# Patient Record
Sex: Female | Born: 1955
Health system: Southern US, Community
[De-identification: ages and names within clinical notes are randomized; demographics above are authoritative.]

## PROBLEM LIST (undated history)

## (undated) DIAGNOSIS — IMO0001 Reserved for inherently not codable concepts without codable children: Secondary | ICD-10-CM

## (undated) DIAGNOSIS — G8929 Other chronic pain: Secondary | ICD-10-CM

## (undated) DIAGNOSIS — K589 Irritable bowel syndrome without diarrhea: Secondary | ICD-10-CM

## (undated) DIAGNOSIS — N816 Rectocele: Secondary | ICD-10-CM

## (undated) DIAGNOSIS — Z9889 Other specified postprocedural states: Secondary | ICD-10-CM

## (undated) DIAGNOSIS — K219 Gastro-esophageal reflux disease without esophagitis: Secondary | ICD-10-CM

## (undated) DIAGNOSIS — Z87820 Personal history of traumatic brain injury: Secondary | ICD-10-CM

## (undated) DIAGNOSIS — R002 Palpitations: Secondary | ICD-10-CM

## (undated) DIAGNOSIS — R519 Headache, unspecified: Secondary | ICD-10-CM

## (undated) DIAGNOSIS — R51 Headache: Secondary | ICD-10-CM

## (undated) DIAGNOSIS — R1013 Epigastric pain: Secondary | ICD-10-CM

## (undated) DIAGNOSIS — N8189 Other female genital prolapse: Secondary | ICD-10-CM

## (undated) HISTORY — DX: Gastro-esophageal reflux disease without esophagitis: K21.9

## (undated) HISTORY — DX: Other female genital prolapse: N81.89

## (undated) HISTORY — DX: Rectocele: N81.6

## (undated) HISTORY — DX: Personal history of traumatic brain injury: Z87.820

## (undated) HISTORY — DX: Irritable bowel syndrome, unspecified: K58.9

## (undated) HISTORY — DX: Headache, unspecified: R51.9

## (undated) HISTORY — DX: Other chronic pain: G89.29

## (undated) HISTORY — DX: Headache: R51

## (undated) HISTORY — DX: Other specified postprocedural states: Z98.890

## (undated) HISTORY — PX: ESOPHAGOGASTRODUODENOSCOPY ENDOSCOPY: SHX5814

## (undated) HISTORY — PX: TONSILLECTOMY: SUR1361

---

## 1999-03-25 DIAGNOSIS — Z9889 Other specified postprocedural states: Secondary | ICD-10-CM

## 1999-03-25 HISTORY — DX: Other specified postprocedural states: Z98.890

## 2000-09-04 ENCOUNTER — Other Ambulatory Visit: Admission: RE | Admit: 2000-09-04 | Discharge: 2000-09-04 | Payer: Self-pay | Admitting: Obstetrics and Gynecology

## 2000-12-31 ENCOUNTER — Encounter: Payer: Self-pay | Admitting: Internal Medicine

## 2000-12-31 ENCOUNTER — Ambulatory Visit (HOSPITAL_COMMUNITY): Admission: RE | Admit: 2000-12-31 | Discharge: 2000-12-31 | Payer: Self-pay | Admitting: Internal Medicine

## 2001-03-11 ENCOUNTER — Encounter: Payer: Self-pay | Admitting: Internal Medicine

## 2001-03-11 ENCOUNTER — Ambulatory Visit (HOSPITAL_COMMUNITY): Admission: RE | Admit: 2001-03-11 | Discharge: 2001-03-11 | Payer: Self-pay | Admitting: Internal Medicine

## 2004-08-26 ENCOUNTER — Other Ambulatory Visit: Admission: RE | Admit: 2004-08-26 | Discharge: 2004-08-26 | Payer: Self-pay | Admitting: Dermatology

## 2005-03-26 ENCOUNTER — Ambulatory Visit: Payer: Self-pay | Admitting: Internal Medicine

## 2005-10-27 ENCOUNTER — Ambulatory Visit: Payer: Self-pay | Admitting: Internal Medicine

## 2006-10-30 ENCOUNTER — Ambulatory Visit: Payer: Self-pay | Admitting: Gastroenterology

## 2007-01-25 ENCOUNTER — Other Ambulatory Visit: Admission: RE | Admit: 2007-01-25 | Discharge: 2007-01-25 | Payer: Self-pay | Admitting: Obstetrics and Gynecology

## 2007-02-05 ENCOUNTER — Ambulatory Visit (HOSPITAL_COMMUNITY): Admission: RE | Admit: 2007-02-05 | Discharge: 2007-02-05 | Payer: Self-pay | Admitting: Obstetrics & Gynecology

## 2007-04-30 ENCOUNTER — Encounter: Payer: Self-pay | Admitting: Emergency Medicine

## 2007-05-01 ENCOUNTER — Ambulatory Visit: Payer: Self-pay | Admitting: *Deleted

## 2007-05-01 ENCOUNTER — Observation Stay (HOSPITAL_COMMUNITY): Admission: EM | Admit: 2007-05-01 | Discharge: 2007-05-01 | Payer: Self-pay | Admitting: Internal Medicine

## 2007-05-14 ENCOUNTER — Ambulatory Visit: Payer: Self-pay

## 2007-06-16 ENCOUNTER — Ambulatory Visit: Payer: Self-pay | Admitting: Cardiology

## 2008-08-18 ENCOUNTER — Other Ambulatory Visit: Admission: RE | Admit: 2008-08-18 | Discharge: 2008-08-18 | Payer: Self-pay | Admitting: Obstetrics and Gynecology

## 2008-12-25 DIAGNOSIS — K589 Irritable bowel syndrome without diarrhea: Secondary | ICD-10-CM | POA: Insufficient documentation

## 2008-12-25 DIAGNOSIS — K219 Gastro-esophageal reflux disease without esophagitis: Secondary | ICD-10-CM | POA: Insufficient documentation

## 2008-12-25 DIAGNOSIS — N959 Unspecified menopausal and perimenopausal disorder: Secondary | ICD-10-CM | POA: Insufficient documentation

## 2008-12-26 ENCOUNTER — Ambulatory Visit (HOSPITAL_COMMUNITY): Admission: RE | Admit: 2008-12-26 | Discharge: 2008-12-26 | Payer: Self-pay | Admitting: Obstetrics & Gynecology

## 2008-12-26 ENCOUNTER — Ambulatory Visit: Payer: Self-pay | Admitting: Internal Medicine

## 2009-01-09 ENCOUNTER — Telehealth (INDEPENDENT_AMBULATORY_CARE_PROVIDER_SITE_OTHER): Payer: Self-pay

## 2009-01-22 IMAGING — CR DG CHEST 1V PORT
1 series · 1 of 1 positions shown · non-contrast
Comparison: No prior studies for comparison.

CLINICAL DATA: Chest pain.  
 PORTABLE CHEST - 1 VIEW ? 04/30/07 AT 2279 HOURS:

[view not recorded]
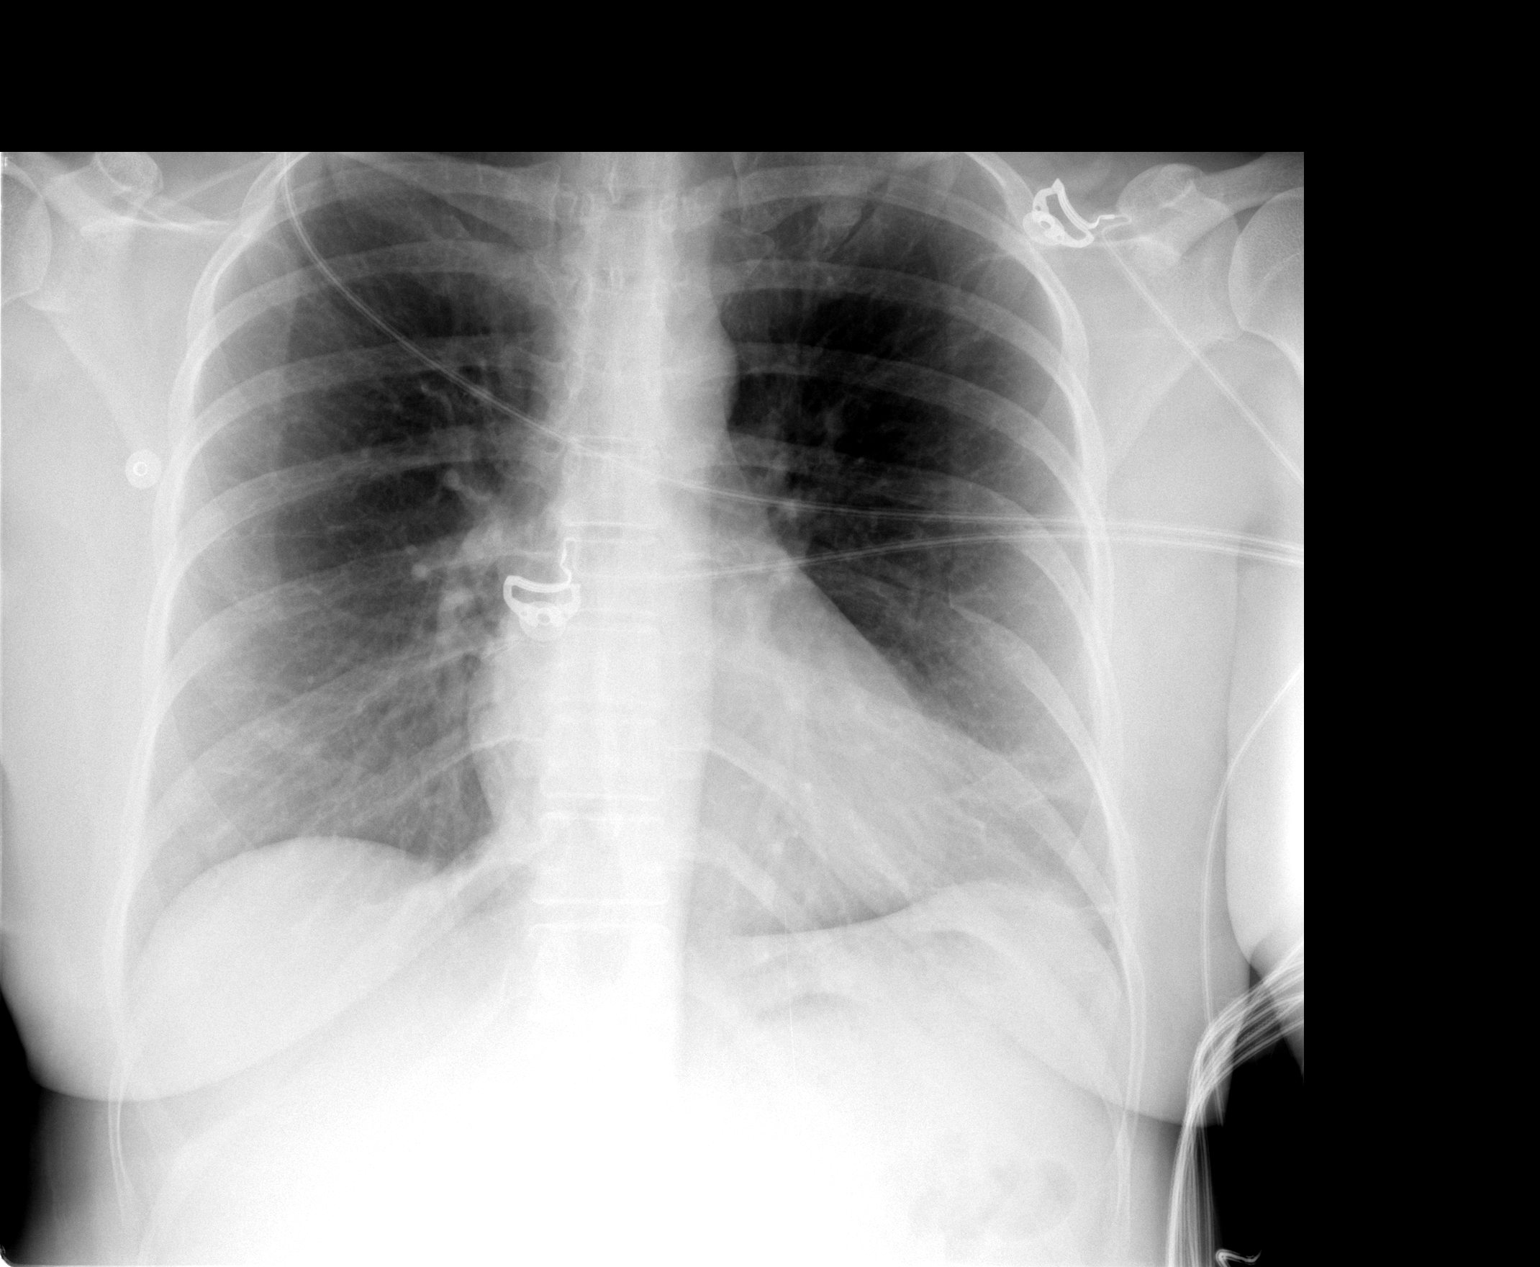

[1 of 1 positions shown; findings below may reference images not displayed]

FINDINGS: The heart size and mediastinal contours are within normal limits.  Both lungs are clear.
IMPRESSION: No acute findings.

## 2010-01-03 ENCOUNTER — Ambulatory Visit (HOSPITAL_COMMUNITY): Admission: RE | Admit: 2010-01-03 | Discharge: 2010-01-03 | Payer: Self-pay | Admitting: Obstetrics and Gynecology

## 2010-01-03 ENCOUNTER — Other Ambulatory Visit: Admission: RE | Admit: 2010-01-03 | Discharge: 2010-01-03 | Payer: Self-pay | Admitting: Obstetrics and Gynecology

## 2010-01-09 ENCOUNTER — Encounter (INDEPENDENT_AMBULATORY_CARE_PROVIDER_SITE_OTHER): Payer: Self-pay | Admitting: *Deleted

## 2010-04-25 NOTE — Assessment & Plan Note (Signed)
Summary: OV 2 YRS,GERD/AMS   Visit Type:  Follow-up Visit Primary Care Provider:  golding  Chief Complaint:  2 year follow up- doing good.  History of Present Illness: Patient is here for two-year followup in regards to gastroesophageal reflux disease. She's done well on Prilosec 20 mg orally daily. She switched from AcipHex because of insurance constraints. She is having no dysphagia no melena rectal bleeding. She was evaluated by Dr. Myrtis Ser 2 years ago for atypical chest pain which is felt to be related to reflux. She had a normal colonoscopy and EGD back in 2001. Colonoscopy was done for Hemoccult positive stool( felt to be related to Ibuprofen). I note she has gained 18 pounds since she was last seen here 2 years ago. She denies any significant intercurrent illness.  Current Problems (verified): 1)  Perimenopausal Syndrome  (ICD-627.9) 2)  Irritable Bowel Syndrome  (ICD-564.1) 3)  Gerd  (ICD-530.81)  Current Medications (verified): 1)  Prilosec Otc 20 Mg Tbec (Omeprazole Magnesium) .... Once Daily 2)  Daily Multiple Vitamins  Tabs (Multiple Vitamin) .... Once Daily 3)  Calcium 500 Mg Tabs (Calcium Carbonate) .... Once Daily 4)  Vitamin D 3000units .... Once Daily 5)  Vitamin C .... Once Daily 6)  Vitamin E .... Once Daily  Allergies (verified): 1)  ! Penicillin  Past History:  Past Surgical History: Last updated: 12/25/2008 tonsillectomy  Family History: Father: alive-MI,dm Mother: alive- MI Siblings: no sisters, 1 brother Family History of Colon Cancer:grandfather  Social History: Marital Status: Married Children: 2 Occupation: [employer] Patient has never smoked.  Alcohol Use - no  Vital Signs:  Patient profile:   55 year old female Height:      63 inches Weight:      169 pounds BMI:     30.05 Temp:     97.8 degrees F oral Pulse rate:   60 / minute BP sitting:   122 / 80  (left arm) Cuff size:   regular  Vitals Entered By: Hendricks Limes  LPN (December 26, 2008 9:11 AM)  Physical Exam  General:  pleasant well-groomed conversant lady in no acute distress. Eyes:  no scleral icterus conjunctivae are pink Lungs:  clear to auscultation Heart:  regular rate and rhythm without murmur gallop rub Abdomen:  somewhat obese positive bowel sounds soft nontender without appreciable mass or organomegaly  Impression & Recommendations: Gastroesophageal  reflux disease controlled on Prilosec 20 mg orally daily. She's gained a significant amount of weight over the past 2 years. I pointed this out to the patient.  Lifestyle diet and exercise changes recommended. Refill on Prilosec 20 mg orally daily written for this nice lady.  Colorectal cancer screening. Last colonoscopy was 2001. We will bring her back in October of 2011 for a screening colonoscopy.  Appended Document: Orders Update-charge    Clinical Lists Changes  Orders: Added new Service order of Est. Patient Level III (16109) - Signed

## 2010-04-25 NOTE — Progress Notes (Signed)
Summary: prilosec rx  Phone Note Call from Patient Call back at Home Phone [phone]   Caller: Patient Summary of Call: pt came to office- she needs rx sent to [facility] for Prilosec otc. RMR wrote for Prilosec. Pts insurance wont pay for it.  per pts insurance she can get a 42 day supply for $5.00. Initial call taken by: Hendricks Limes LPN,  January 09, 2009 3:01 PM     Appended Document: prilosec rx    Prescriptions: PRILOSEC OTC 20 MG TBEC (OMEPRAZOLE MAGNESIUM) once daily  #42 x 11   Entered and Authorized by:   Leanna Battles. Dixon Boos   Signed by:   Leanna Battles Dixon Boos on 01/10/2009   Method used:   Electronically to        [facility] (retail)       [address]       [place], [place]  [address]       Ph: [phone]       Fax: [phone]   RxID:   [id number]

## 2010-04-25 NOTE — Letter (Signed)
Summary: Recall, Screening Colonoscopy Only  [facility]  [address]   [place], [place] [address]   Phone: [phone]  Fax: [phone]    January 09, 2010  Ruth Warner [name] [place], [place]  [address] 12-23-1955   Dear Ms. Biber,   Our records indicate it is time to schedule your colonoscopy.    Please call our office at [phone] and ask for the nurse.   Thank you,    Hendricks Limes, LPN Cloria Spring, LPN  [facility] Ph: [phone]   Fax: [phone]

## 2010-05-05 ENCOUNTER — Emergency Department (HOSPITAL_COMMUNITY)
Admission: EM | Admit: 2010-05-05 | Discharge: 2010-05-05 | Disposition: A | Discharge status: Home / Self Care | Payer: BC Managed Care – PPO | Attending: Emergency Medicine | Admitting: Emergency Medicine

## 2010-05-05 DIAGNOSIS — Y9229 Other specified public building as the place of occurrence of the external cause: Secondary | ICD-10-CM | POA: Insufficient documentation

## 2010-05-05 DIAGNOSIS — W2209XA Striking against other stationary object, initial encounter: Secondary | ICD-10-CM | POA: Insufficient documentation

## 2010-05-05 DIAGNOSIS — S0100XA Unspecified open wound of scalp, initial encounter: Secondary | ICD-10-CM | POA: Insufficient documentation

## 2010-05-10 ENCOUNTER — Emergency Department (HOSPITAL_COMMUNITY): Payer: BC Managed Care – PPO

## 2010-05-10 ENCOUNTER — Emergency Department (HOSPITAL_COMMUNITY)
Admission: EM | Admit: 2010-05-10 | Discharge: 2010-05-10 | Disposition: A | Discharge status: Home / Self Care | Payer: BC Managed Care – PPO | Attending: Emergency Medicine | Admitting: Emergency Medicine

## 2010-05-10 DIAGNOSIS — R42 Dizziness and giddiness: Secondary | ICD-10-CM | POA: Insufficient documentation

## 2010-05-10 DIAGNOSIS — F0781 Postconcussional syndrome: Secondary | ICD-10-CM | POA: Insufficient documentation

## 2010-07-11 ENCOUNTER — Telehealth: Payer: Self-pay

## 2010-07-11 NOTE — Telephone Encounter (Signed)
Gastroenterology Pre-Procedure Form  Request Date: 07/11/2010      PATIENT INFORMATION:  Ruth Warner is a 55 y.o., female (DOB=1956/03/22).  PROCEDURE: Procedure(s) requested: colonoscopy Procedure Reason: family history of colon cancer  PATIENT REVIEW QUESTIONS: The patient reports the following:   1. Diabetes Melitis: no 2. Joint replacements in the past 12 months: no 3. Major health problems in the past 3 months: no 4. Has an artificial valve or MVP:no 5. Has been advised in past to take antibiotics in advance of a procedure like teeth cleaning: no}    MEDICATIONS & ALLERGIES:    Patient reports the following regarding taking any blood thinners:   Plavix? no Aspirin?no Coumadin?  no  Patient confirms/reports the following medications:  Current Outpatient Prescriptions  Medication Sig Dispense Refill  . Cholecalciferol (VITAMIN D) 2000 UNITS tablet Take 2,000 Units by mouth daily.        . Omega-3 Fatty Acids (FISH OIL) 1000 MG CAPS Take by mouth 3 (three) times daily.        Marland Kitchen omeprazole (PRILOSEC) 20 MG capsule Take 20 mg by mouth daily.        . vitamin B-12 (CYANOCOBALAMIN) 1000 MCG tablet Take 1,000 mcg by mouth daily.          Patient confirms/reports the following allergies:  Allergies  Allergen Reactions  . Penicillins     REACTION: rash    Patient is appropriate to schedule for requested procedure(s): yes  AUTHORIZATION INFORMATION Primary Insurance: State BCBS    ID #: [id number] Pre-Cert / Berkley Harvey required:  Pre-Cert / Auth #:  Secondary Insurance: Anthem    ID #: [id number] Pre-Cert / Auth required: Pre-Cert / Auth #  No orders of the defined types were placed in this encounter.    SCHEDULE INFORMATION: Procedure has been scheduled as follows:  Date: 07/30/2010 Time: 9:15 AM  Location: [facility] Short Stay  This Gastroenterology Pre-Precedure Form is being routed to the following provider(s) for review: Lorenza Burton, NP

## 2010-07-15 ENCOUNTER — Other Ambulatory Visit: Payer: Self-pay

## 2010-07-15 DIAGNOSIS — Z139 Encounter for screening, unspecified: Secondary | ICD-10-CM

## 2010-07-15 NOTE — Telephone Encounter (Signed)
OK for colonoscopy.  

## 2010-07-17 ENCOUNTER — Encounter: Payer: Self-pay | Admitting: Gastroenterology

## 2010-07-22 ENCOUNTER — Encounter: Payer: Self-pay | Admitting: Gastroenterology

## 2010-07-22 ENCOUNTER — Ambulatory Visit (INDEPENDENT_AMBULATORY_CARE_PROVIDER_SITE_OTHER): Payer: BC Managed Care – PPO | Admitting: Gastroenterology

## 2010-07-22 VITALS — BP 132/78 | HR 69 | Temp 98.6°F | Ht 63.0 in | Wt 171.4 lb

## 2010-07-22 DIAGNOSIS — Z1211 Encounter for screening for malignant neoplasm of colon: Secondary | ICD-10-CM

## 2010-07-22 DIAGNOSIS — K589 Irritable bowel syndrome without diarrhea: Secondary | ICD-10-CM

## 2010-07-22 DIAGNOSIS — K219 Gastro-esophageal reflux disease without esophagitis: Secondary | ICD-10-CM

## 2010-07-22 MED ORDER — OMEPRAZOLE MAGNESIUM 20 MG PO TBEC: 20.0000 mg | DELAYED_RELEASE_TABLET | Freq: Every day | ORAL | Status: DC

## 2010-07-22 NOTE — Progress Notes (Signed)
Presents with gas, baseline for her for IBS. Intermittent. Relieved by BMs. Bloating. No melena or brbpr. No N/V. No probiotic.  No dysphagia. No lack of appetite.   Primary Care Physician:  Cassell Smiles., MD Primary Gastroenterologist:  Dr. Jena Gauss   Chief Complaint  Patient presents with  . Medication Refill    HPI:  Ruth Warner is a 55 y.o. female here as a follow-up prior to screening colonoscopy, last performed in 2001, normal. Pt has hx of IBS and GERD. She denies melena or brbpr. Denies nausea or vomiting. No lack of appetite. Only complaint is of gas/bloating. She reports she is at baseline with her IBS, with abdominal cramping prior to BMs, then relieved. This is not a new finding. She is not currently taking a probiotic. She is really doing quite well overall. Has FH of colon ca (paternal grandfather).   Past Medical History  Diagnosis Date  . GERD (gastroesophageal reflux disease)   . IBS (irritable bowel syndrome)   . Concussion     staples, hit a shelf  . S/P colonoscopy 2001    Dr. Jena Gauss: normal. Repeat 10 years  . S/P endoscopy 2001    Dr. Jena Gauss: normal    Past Surgical History  Procedure Date  . Tonsillectomy     Current Outpatient Prescriptions  Medication Sig Dispense Refill  . Ascorbic Acid (VITAMIN C) 1000 MG tablet Take 1,000 mg by mouth daily.        . Cholecalciferol (VITAMIN D) 2000 UNITS tablet Take 2,000 Units by mouth daily.        . Multiple Vitamin (MULTIVITAMIN) capsule Take 1 capsule by mouth daily.        . Omega-3 Fatty Acids (FISH OIL) 1000 MG CAPS Take by mouth 3 (three) times daily.        Marland Kitchen omeprazole (PRILOSEC) 20 MG capsule Take 20 mg by mouth daily.        . vitamin B-12 (CYANOCOBALAMIN) 1000 MCG tablet Take 1,000 mcg by mouth daily.        . calcium carbonate (TUMS - DOSED IN MG ELEMENTAL CALCIUM) 500 MG chewable tablet Chew 1 tablet by mouth daily.        . vitamin E 1000 UNIT capsule Take 1,000 Units by mouth daily.           Allergies as of 07/22/2010 - Review Complete 07/22/2010  Allergen Reaction Noted  . Penicillins      Family History  Problem Relation Age of Onset  . Colon cancer Paternal Grandfather     History   Social History  . Marital Status: Married    Spouse Name: N/A    Number of Children: N/A  . Years of Education: N/A   Occupational History  . Full-Time     [employer]   Social History Main Topics  . Smoking status: Never Smoker   . Smokeless tobacco: Never Used  . Alcohol Use: No  . Drug Use: No    Review of Systems: Gen: Denies any fever, chills, sweats, anorexia, fatigue, weakness, malaise, weight loss, and sleep disorder CV: Denies chest pain, angina, palpitations, syncope, orthopnea, PND, peripheral edema, and claudication. Resp: Denies dyspnea at rest, dyspnea with exercise, cough, sputum, wheezing, coughing up blood, and pleurisy. GI: Denies vomiting blood, jaundice, and fecal incontinence.   Denies dysphagia or odynophagia. GU : Denies urinary burning, blood in urine, urinary frequency, urinary hesitancy, nocturnal urination, and urinary incontinence. MS: Denies joint pain, limitation  of movement, and swelling, stiffness, low back pain, extremity pain. Denies muscle weakness, cramps, atrophy.  Derm: Denies rash, itching, dry skin, hives, moles, warts, or unhealing ulcers.  Psych: Denies depression, anxiety, memory loss, suicidal ideation, hallucinations, paranoia, and confusion. Heme: Denies bruising, bleeding, and enlarged lymph nodes.  Physical Exam: BP 132/78  Pulse 69  Temp(Src) 98.6 F (37 C) (Tympanic)  Ht 5\' 3"  (1.6 m)  Wt 171 lb 6.4 oz (77.747 kg)  BMI 30.36 kg/m2 General:   Alert,  Well-developed, well-nourished, pleasant and cooperative in NAD Head:  Normocephalic and atraumatic. Eyes:  Sclera clear, no icterus.   Conjunctiva pink. Ears:  Normal auditory acuity. Nose:  No deformity, discharge,  or lesions. Mouth:  No  deformity or lesions, dentition normal. Neck:  Supple; no masses or thyromegaly. Lungs:  Clear throughout to auscultation.   No wheezes, crackles, or rhonchi. No acute distress. Heart:  Regular rate and rhythm; no murmurs, clicks, rubs,  or gallops. Abdomen:  Soft, nontender and nondistended. No masses, hepatosplenomegaly or hernias noted. Normal bowel sounds, without guarding, and without rebound.   Rectal:  Deferred until time of colonoscopy.   Msk:  Symmetrical without gross deformities. Normal posture. Extremities:  Without clubbing or edema. Neurologic:  Alert and  oriented x4;  grossly normal neurologically. Skin:  Intact without significant lesions or rashes. Cervical Nodes:  No significant cervical adenopathy. Psych:  Alert and cooperative. Normal mood and affect.

## 2010-07-22 NOTE — Patient Instructions (Signed)
We have set you up for a routine screening colonoscopy.  Begin taking a probiotic daily (this is over-the-counter). See samples.  Continue Prilosec 20 mg daily.  F/U in 1 year or sooner if needed

## 2010-07-23 ENCOUNTER — Encounter: Payer: Self-pay | Admitting: Gastroenterology

## 2010-07-23 DIAGNOSIS — Z1211 Encounter for screening for malignant neoplasm of colon: Secondary | ICD-10-CM | POA: Insufficient documentation

## 2010-07-23 NOTE — Assessment & Plan Note (Signed)
55 year old Caucasian female with FH of colon cancer (paternal grandfather), with last colonoscopy normal in 2001. Due for updated screening colonoscopy. No melena or brbpr, no abdominal pain, change in bowel habits, N/V, wt loss, or lack of appetite. Doing well.  Proceed with TCS with Dr. Jena Gauss in near future: the risks, benefits, and alternatives have been discussed with the patient in detail. The patient states understanding and desires to proceed.

## 2010-07-23 NOTE — Progress Notes (Signed)
Cc to PCP 

## 2010-07-23 NOTE — Assessment & Plan Note (Signed)
At baseline; only complaint is of gas/bloating. Will add probiotic.  Probiotic daily, samples provided

## 2010-07-23 NOTE — Assessment & Plan Note (Signed)
Controlled well with Prilosec OTC. No dysphagia or odynophagia. No N/V. Requesting rx for Prilosec OTC.   Prilosec OTC 20 mg daily, # 42 (per pt request and insurance) with 5 refills. Return in 1 year for f/u

## 2010-07-30 ENCOUNTER — Other Ambulatory Visit: Payer: Self-pay | Admitting: Internal Medicine

## 2010-07-30 ENCOUNTER — Encounter: Payer: BC Managed Care – PPO | Admitting: Internal Medicine

## 2010-07-30 ENCOUNTER — Ambulatory Visit (HOSPITAL_COMMUNITY)
Admission: RE | Admit: 2010-07-30 | Discharge: 2010-07-30 | Disposition: A | Discharge status: Home / Self Care | Payer: BC Managed Care – PPO | Source: Ambulatory Visit | Attending: Internal Medicine | Admitting: Internal Medicine

## 2010-07-30 DIAGNOSIS — Z1211 Encounter for screening for malignant neoplasm of colon: Secondary | ICD-10-CM | POA: Insufficient documentation

## 2010-07-30 DIAGNOSIS — D126 Benign neoplasm of colon, unspecified: Secondary | ICD-10-CM

## 2010-07-30 HISTORY — PX: COLONOSCOPY: SHX174

## 2010-08-01 ENCOUNTER — Encounter: Payer: Self-pay | Admitting: Internal Medicine

## 2010-08-02 NOTE — Op Note (Signed)
  NAME:  Ruth Warner, Ruth Warner              ACCOUNT NO.:  [id number]  MEDICAL RECORD NO.:  [id number]           PATIENT TYPE:  O  LOCATION:  DAYP                          FACILITY:  [facility]  PHYSICIAN:  R. Roetta Sessions, M.D. DATE OF BIRTH:  05/19/1955  DATE OF PROCEDURE:  07/30/2010 DATE OF DISCHARGE:                              OPERATIVE REPORT   INDICATIONS FOR PROCEDURE:  A 55 year old lady with irritable bowel syndrome, presents here for screening colonoscopy.  No family history of any colon cancer or polyps in a first-degree relatives.  Negative colonoscopy10 years ago.  Colonoscopy is now being done as standard screening maneuver.  Risks, benefits, limitations, alternatives, and imponderables have been discussed, questions answered.  Please see the documentation in the medical record.  PROCEDURE NOTE:  O2 saturation, blood pressure, pulse, and respirations were monitored throughout the entirety of the procedure.  CONSCIOUS SEDATION:  Versed 4 mg IV, Demerol 75 mg IV in divided doses.  INSTRUMENT:  Pentax video chip system.  FINDINGS:  Digital rectal exam revealed no abnormalities.  Endoscopic findings:  Prep was adequate.  Colon:  Colonic mucosa surveyed from the rectosigmoid junction through the left transverse, right colon, to the appendiceal orifice, ileocecal valve/cecum.  These structures were well seen and photographed for the record.  From this level, the scope was slowly withdrawn.  All previously mentioned mucosal surfaces were again seen.  The patient has single diminutive polyp in the mid descending colon which was cold biopsied/removed.  Remainder of colonic mucosa appeared normal.  The scope was pulled down in to the rectum where thorough examination of the rectal mucosa including retroflex view of the anal verge demonstrated no abnormalities.  The patient tolerated the procedure well.  Cecal withdrawal time 7 minutes.  IMPRESSION: 1. Normal rectum. 2. Single  diminutive polyp in the mid descending colon status post     cold biopsy removal.  Remaining colonic mucosa appeared normal.  RECOMMENDATIONS: 1. Follow up on path. 2. Further recommendations to follow.     Jonathon Bellows, M.D.     RMR/MEDQ  D:  07/30/2010  T:  07/30/2010  Job:  [id number]  cc:   Madelin Rear. Sherwood Gambler, MD Fax: [phone]  Electronically Signed by Lorrin Goodell M.D. on 08/02/2010 08:20:09 AM

## 2010-08-06 NOTE — H&P (Signed)
NAMEGENESYS, COGGESHALL              ACCOUNT NO.:  [id number]   MEDICAL RECORD NO.:  [id number]          PATIENT TYPE:  INP   LOCATION:  2033                         FACILITY:  [facility]   PHYSICIAN:  Unice Cobble, MD     DATE OF BIRTH:  1955-10-20   DATE OF ADMISSION:  05/01/2007  DATE OF DISCHARGE:                              HISTORY & PHYSICAL   CHIEF COMPLAINT:  Chest pain.   HISTORY OF PRESENT ILLNESS:  This is a 55 year old white female with no  past medical history who presents with chest pain.  Her chest pain  occurred last night for approximately 1 hour and dissolved on its own.  Tonight, it reoccurred while she was sitting.  She describes the pain as  substernal chest tightness which radiates to her throat with bilateral  hand tingling and heavy heart beats.  She denies shortness of breath,  diaphoresis, nausea, vomiting, presyncope.  The pain lasts approximately  1-1/2 hours until sublingual nitroglycerin spray was given in the  emergency department which dissolved the pain.  No symptoms of CHF.  She  is currently going through menopause.   PAST MEDICAL HISTORY:  Status post tonsillectomy.   ALLERGIES:  PENICILLIN.   MEDICATIONS:  1. Multivitamin.  2. Fish oil.   SOCIAL HISTORY:  She lives in [place] with her husband.  She is a  bookkeeper.  No tobacco, alcohol or drugs.   FAMILY HISTORY:  Her mother had a heart attack at the age of 49.  Father  had a heart attack in his 15s.   REVIEW OF SYSTEMS:  Her last menstrual period was October 02, 2006.  All  other review of systems were reviewed and found to be negative except as  stated in the HPI.   PHYSICAL EXAMINATION:  VITAL SIGNS:  Temperature afebrile, pulse 97,  respiratory rate 18, blood pressure 128/63, O2 sats are 100% on room  air.  GENERAL:  She is in no acute distress.  Thin.  HEENT:  Normocephalic, atraumatic.  PERRLA.  EOMI.  MMM.  Normal  dentition.  Oropharynx without erythema or exudates.  NECK:   Supple without lymphadenopathy, thyromegaly, bruits or jugular  venous distention.  HEART:  Regular rate and rhythm with normal S1 and S2.  No murmurs, rubs  or gallops.  Normal PMI.  Pulses are 2+ and equal bilaterally without  bruits.  LUNGS:  Clear to auscultation bilaterally.  SKIN:  No rashes or lesions.  ABDOMEN:  Soft, nontender with normal bowel sounds.  No rebound or  guarding.  Negative hepatosplenomegaly.  EXTREMITIES:  No clubbing, cyanosis, or edema.  MUSCULOSKELETAL:  No joint deformity, effusions of spine or  costovertebral angle tenderness.  NEUROLOGIC:  She is alert and oriented x 3.  Cranial nerves II-XII are  grossly intact.  Strength 5/5 to all extremities in all muscle groups.  Normal sensation throughout.  Normal cerebellar function.   DIAGNOSTICS:  EKG shows a rate of 100 with normal sinus rhythm.  She has  mild ST/T wave changes which are nonspecific.   LABORATORY DATA:  White count 6.5 with a hemoglobin  of 13.8, platelet  count 191,000, BNP is less than 30 with negative CK-MB and troponin.  Creatinine 0.8.   ASSESSMENT/PLAN:  This is a 55 year old white female with no past  medical history and a positive family history of early coronary artery  disease, who presents with chest pain concerning for unstable angina.  I  will continue her treatment with Lovenox, aspirin and nitroglycerin  p.r.n.  Oxygen will be administered and she will be given metoprolol if  hypertensive.  Lipids will be checked and a statin will be started.  Stress versus catheterization will be decided depending on enzyme values  of her cardiac rule out.      Unice Cobble, MD  Electronically Signed     ACJ/MEDQ  D:  05/01/2007  T:  05/01/2007  Job:  [id number]

## 2010-08-06 NOTE — Assessment & Plan Note (Signed)
NAME:  Ruth Warner, Ruth Warner               CHART#:  [id number]   DATE:  10/30/2006                       DOB:  10/20/1955   CHIEF COMPLAINT:  Followup of acid reflux disease and irritable bowel  syndrome.   SUBJECTIVE:  The patient is here for a followup.  She was last seen on  October 27, 2005.  She has done very well since we last saw her.  She has  gastroesophageal reflux disease, controlled on AcipHex 20 mg daily.  If  she misses a dose, she begins having breakthrough symptoms.  Her  irritable bowel syndrome symptoms are very infrequent.  The last  colonoscopy was in 2001 and she is not due for a repeat screen until  2011.  She denies any melena or rectal bleeding.  No odynophagia or  dysphagia.   CURRENT MEDICATIONS:  AcipHex 20 mg daily.   ALLERGIES:  Penicillin.   PHYSICAL EXAM:  Weight 151, up 2.5 pounds.  Temperature 98.4, blood  pressure 108/64, pulse 64.  GENERAL:  A pleasant well-developed, well-nourished Caucasian female in  no acute distress.  SKIN:  Warm and dry.  No jaundice.  ABDOMEN:  Positive bowel sounds.  Soft, nontender, nondistended.  No  organomegaly or masses.   IMPRESSION:  1. Gastroesophageal reflux disease well-controlled on AcipHex.  2. Irritable bowel syndrome symptoms, quiescent.   RECOMMENDATIONS:  1. Continue antireflux measures.  2. Continue AcipHex 20 mg daily.  3. Will see her back in 2 years.       Tana Coast, P.A.  Electronically Signed     R. Roetta Sessions, M.D.  Electronically Signed    LL/MEDQ  D:  10/30/2006  T:  10/30/2006  Job:  [id number]   cc:   Kirk Ruths, M.D.

## 2010-08-06 NOTE — Discharge Summary (Signed)
Ruth Warner, Ruth Warner              ACCOUNT NO.:  [id number]   MEDICAL RECORD NO.:  [id number]          PATIENT TYPE:  INP   LOCATION:  2033                         FACILITY:  [facility]   PHYSICIAN:  Luis Abed, MD, FACCDATE OF BIRTH:  [birth year]   DATE OF ADMISSION:  05/01/2007  DATE OF DISCHARGE:  05/01/2007                         DISCHARGE SUMMARY - REFERRING   PRIMARY CARE PHYSICIAN:  Kirk Ruths, M.D.   GASTROENTEROLOGIST:  Jonathon Bellows, M.D.   CARDIOLOGIST:  Luis Abed, M.D., [facility]   BRIEF HISTORY:  Ms. Choy is a 55 year old female who presented with 1  hour of chest discomfort that occurred with rest.  She described it as  tightness radiating to the throat and bilateral hand tingling.  She  noticed heavy heartbeat but specifically denied any associated shortness  of breath, diaphoresis, nausea, or vomiting.  She stated that it lasted  for about 1-1/2 hours until she received sublingual nitroglycerin spray  in the emergency room which resolved the discomfort.   PAST MEDICAL HISTORY:  1. Tonsillectomy.  2. Prior GI evaluation for GERD.  3. Perimenopausal.   LABORATORY DATA:  Admission hemoglobin 13.8, hematocrit 38.9, normal  indices, platelets 191, WBCs 6.5.  PTT 38, PT 14.5.  Sodium 138,  potassium 3.5, BUN 9, creatinine 0.79, glucose 145.  CK-MBs, relative  indexes, and troponins were within normal limits x2.  BNP less than 30.  Fasting lipids showed a total cholesterol 143, triglycerides 29, HDL 44,  LDL 93.   After being seen by Dr. Laural Benes, she was admitted to the hospital and  placed on Lovenox as well as aspirin and beta blocker.  Over the next 12  hours, she had two sets of enzymes and two point-of-care markers that  were unremarkable.  After review, Dr. Myrtis Ser felt that the patient could  be discharged home with outpatient followup.   DISCHARGE DIAGNOSIS:  Chest discomfort of uncertain etiology.  History  as noted above.   DISPOSITION:  The  patient is discharged home.   DIET:  She is asked to maintain a low-sodium, heart-healthy diet.   ACTIVITY:  Activity is not restricted.   WOUND CARE:  Not applicable.   DISCHARGE MEDICATIONS:  She received a new prescription for Protonix 40  mg daily and asked to begin aspirin 81 mg daily.  She was given  permission to continue multivitamins and fish oil as previously.   FOLLOW UP:  The office will call her with arrangements for a stress  Myoview next week and a followup appointment with Dr. Myrtis Ser.  She was  asked to bring all medications to all appointments.   TIME SPENT AT DISCHARGE:  25 minutes.      Joellyn Rued, PA-C      Luis Abed, MD, [facility]  Electronically Signed    EW/MEDQ  D:  05/01/2007  T:  05/02/2007  Job:  [id number]   cc:   Kirk Ruths, M.D.  Jonathon Bellows, M.D.  Luis Abed, MD, [facility]

## 2010-08-06 NOTE — Assessment & Plan Note (Signed)
[facility]                            CARDIOLOGY OFFICE NOTE   NAME:Nudelman, SUNDAY KLOS                     MRN:          [id number]  DATE:06/16/2007                            DOB:          1955/12/20    Ms. Montone had been seen at [facility] on May 01, 2007.  She  had a pressure sensation in her chest.  She stabilized and we decided  that it would be safe for her to go home and have an outpatient Myoview  scan. The study was done on May 14, 2007 and she did quite well.  She exercised 7-1/2 minutes.  There was no EKG change.  The nuclear  images were normal. She is feeling well. She is having symptoms that may  be related to menopause.  At times she has a sensation of heat and  various other feelings.  She will need to follow with her other doctors  to get their opinion concerning this.  I doubt that her current symptoms  are cardiac.   PAST MEDICAL HISTORY:   ALLERGIES:  PENICILLIN.   MEDICATIONS:  Protonix, aspirin, fish oil, multivitamin.   OTHER MEDICAL PROBLEMS:  See the list below.   REVIEW OF SYSTEMS:  Other than the HPI she feels fine.   PHYSICAL EXAM:  Blood pressure is 118/79 with a pulse of 80.  The patient is oriented to person, time and place.  Affect is normal.  HEENT:  Reveals no xanthelasma.  She has normal extraocular motion.  LUNGS:  Clear.  CARDIAC:  Reveals an S1 with an S2.  There are no clicks or significant  murmurs.  She has no peripheral edema.   PROBLEM LIST:  1. History of GERD.  2. Perimenopausal.  3. Chest discomfort with negative cardiac workup at this point.   I would recommend no further cardiac workup now.  I have encouraged the  patient to remain active and to follow-up with her primary doctors.  I  will be happy to see her back in the future if needed.    Luis Abed, MD, [facility]  Electronically Signed   JDK/MedQ  DD: 06/16/2007  DT: 06/17/2007  Job #: [id number]   cc:   Madelin Rear.  Sherwood Gambler, MD

## 2010-12-10 ENCOUNTER — Other Ambulatory Visit: Payer: Self-pay | Admitting: Obstetrics and Gynecology

## 2010-12-10 DIAGNOSIS — Z139 Encounter for screening, unspecified: Secondary | ICD-10-CM

## 2010-12-13 LAB — DIFFERENTIAL
Basophils Absolute: 0
Basophils Relative: 0
Eosinophils Absolute: 0.1
Eosinophils Relative: 1
Lymphocytes Relative: 29
Lymphs Abs: 1.9
Monocytes Absolute: 0.5
Monocytes Relative: 8
Neutro Abs: 4.1
Neutrophils Relative %: 62

## 2010-12-13 LAB — CBC
HCT: 37.5
HCT: 39.9
Hemoglobin: 12.8
Hemoglobin: 13.8
MCHC: 34.2
MCHC: 34.5
MCV: 89.8
MCV: 90.9
Platelets: 190
Platelets: 191
RBC: 4.13
RBC: 4.44
RDW: 13
RDW: 13.1
WBC: 6.5
WBC: 7.2

## 2010-12-13 LAB — BASIC METABOLIC PANEL
BUN: 8
BUN: 9
CO2: 24
CO2: 27
Calcium: 8.4
Calcium: 9.1
Chloride: 103
Chloride: 106
Creatinine, Ser: 0.67
Creatinine, Ser: 0.79
GFR calc Af Amer: >60
GFR calc Af Amer: >60
GFR calc non Af Amer: >60
GFR calc non Af Amer: >60
Glucose, Bld: 115 — ABNORMAL HIGH
Glucose, Bld: 145 — ABNORMAL HIGH
Potassium: 3.5
Potassium: 3.8
Sodium: 134 — ABNORMAL LOW
Sodium: 138

## 2010-12-13 LAB — POCT CARDIAC MARKERS
CKMB, poc: <1 — ABNORMAL LOW
CKMB, poc: <1 — ABNORMAL LOW
Myoglobin, poc: 26.3
Myoglobin, poc: 29.4
Operator id: 106841
Operator id: 179121
Troponin i, poc: <0.05
Troponin i, poc: <0.05

## 2010-12-13 LAB — CARDIAC PANEL(CRET KIN+CKTOT+MB+TROPI)
CK, MB: 0.5
CK, MB: 0.6
Relative Index: INVALID
Relative Index: INVALID
Total CK: 38
Total CK: 41
Troponin I: 0.01
Troponin I: <0.01

## 2010-12-13 LAB — B-NATRIURETIC PEPTIDE (CONVERTED LAB): Pro B Natriuretic peptide (BNP): <30

## 2010-12-13 LAB — LIPID PANEL
Cholesterol: 143
HDL: 44
LDL Cholesterol: 93
Total CHOL/HDL Ratio: 3.3
Triglycerides: 29
VLDL: 6

## 2010-12-13 LAB — PROTIME-INR
INR: 1.1
Prothrombin Time: 14.5

## 2010-12-13 LAB — APTT: aPTT: 38 — ABNORMAL HIGH

## 2011-01-06 ENCOUNTER — Ambulatory Visit (HOSPITAL_COMMUNITY)
Admission: RE | Admit: 2011-01-06 | Discharge: 2011-01-06 | Disposition: A | Discharge status: Home / Self Care | Payer: BC Managed Care – PPO | Source: Ambulatory Visit | Attending: Obstetrics and Gynecology | Admitting: Obstetrics and Gynecology

## 2011-01-06 DIAGNOSIS — Z1231 Encounter for screening mammogram for malignant neoplasm of breast: Secondary | ICD-10-CM | POA: Insufficient documentation

## 2011-01-06 DIAGNOSIS — Z139 Encounter for screening, unspecified: Secondary | ICD-10-CM

## 2011-01-20 ENCOUNTER — Other Ambulatory Visit (HOSPITAL_COMMUNITY)
Admission: RE | Admit: 2011-01-20 | Discharge: 2011-01-20 | Disposition: A | Discharge status: Home / Self Care | Payer: BC Managed Care – PPO | Source: Ambulatory Visit | Attending: Obstetrics and Gynecology | Admitting: Obstetrics and Gynecology

## 2011-01-20 ENCOUNTER — Other Ambulatory Visit: Payer: Self-pay | Admitting: Adult Health

## 2011-01-20 DIAGNOSIS — Z01419 Encounter for gynecological examination (general) (routine) without abnormal findings: Secondary | ICD-10-CM | POA: Insufficient documentation

## 2011-03-05 ENCOUNTER — Other Ambulatory Visit: Payer: Self-pay | Admitting: Gastroenterology

## 2011-08-28 ENCOUNTER — Encounter: Payer: Self-pay | Admitting: Internal Medicine

## 2011-10-02 ENCOUNTER — Other Ambulatory Visit (HOSPITAL_COMMUNITY): Payer: Self-pay | Admitting: Family Medicine

## 2011-10-02 ENCOUNTER — Ambulatory Visit (HOSPITAL_COMMUNITY)
Admission: RE | Admit: 2011-10-02 | Discharge: 2011-10-02 | Disposition: A | Discharge status: Home / Self Care | Payer: BC Managed Care – PPO | Source: Ambulatory Visit | Attending: Family Medicine | Admitting: Family Medicine

## 2011-10-02 DIAGNOSIS — M19079 Primary osteoarthritis, unspecified ankle and foot: Secondary | ICD-10-CM | POA: Insufficient documentation

## 2011-10-02 DIAGNOSIS — M25579 Pain in unspecified ankle and joints of unspecified foot: Secondary | ICD-10-CM

## 2011-11-27 ENCOUNTER — Other Ambulatory Visit: Payer: Self-pay | Admitting: Gastroenterology

## 2011-12-12 ENCOUNTER — Other Ambulatory Visit: Payer: Self-pay | Admitting: Obstetrics and Gynecology

## 2011-12-12 DIAGNOSIS — IMO0001 Reserved for inherently not codable concepts without codable children: Secondary | ICD-10-CM

## 2012-01-08 ENCOUNTER — Ambulatory Visit (HOSPITAL_COMMUNITY)
Admission: RE | Admit: 2012-01-08 | Discharge: 2012-01-08 | Disposition: A | Discharge status: Home / Self Care | Payer: BC Managed Care – PPO | Source: Ambulatory Visit | Attending: Obstetrics and Gynecology | Admitting: Obstetrics and Gynecology

## 2012-01-08 DIAGNOSIS — IMO0001 Reserved for inherently not codable concepts without codable children: Secondary | ICD-10-CM

## 2012-01-08 DIAGNOSIS — Z1231 Encounter for screening mammogram for malignant neoplasm of breast: Secondary | ICD-10-CM | POA: Insufficient documentation

## 2012-01-13 ENCOUNTER — Other Ambulatory Visit: Payer: Self-pay | Admitting: Obstetrics and Gynecology

## 2012-01-13 DIAGNOSIS — R928 Other abnormal and inconclusive findings on diagnostic imaging of breast: Secondary | ICD-10-CM

## 2012-01-28 ENCOUNTER — Ambulatory Visit (HOSPITAL_COMMUNITY)
Admission: RE | Admit: 2012-01-28 | Discharge: 2012-01-28 | Disposition: A | Discharge status: Home / Self Care | Payer: BC Managed Care – PPO | Source: Ambulatory Visit | Attending: Obstetrics and Gynecology | Admitting: Obstetrics and Gynecology

## 2012-01-28 ENCOUNTER — Other Ambulatory Visit: Payer: Self-pay | Admitting: Obstetrics and Gynecology

## 2012-01-28 DIAGNOSIS — R928 Other abnormal and inconclusive findings on diagnostic imaging of breast: Secondary | ICD-10-CM

## 2012-02-03 ENCOUNTER — Other Ambulatory Visit (HOSPITAL_COMMUNITY)
Admission: RE | Admit: 2012-02-03 | Discharge: 2012-02-03 | Disposition: A | Discharge status: Home / Self Care | Payer: BC Managed Care – PPO | Source: Ambulatory Visit | Attending: Obstetrics and Gynecology | Admitting: Obstetrics and Gynecology

## 2012-02-03 ENCOUNTER — Other Ambulatory Visit: Payer: Self-pay | Admitting: Adult Health

## 2012-02-03 DIAGNOSIS — Z01419 Encounter for gynecological examination (general) (routine) without abnormal findings: Secondary | ICD-10-CM | POA: Insufficient documentation

## 2012-02-03 DIAGNOSIS — Z1151 Encounter for screening for human papillomavirus (HPV): Secondary | ICD-10-CM | POA: Insufficient documentation

## 2012-04-09 ENCOUNTER — Other Ambulatory Visit: Payer: Self-pay | Admitting: Gastroenterology

## 2012-04-09 NOTE — Telephone Encounter (Signed)
Pt due for 1 yr FU OV Please schedule

## 2012-04-12 ENCOUNTER — Encounter: Payer: Self-pay | Admitting: Internal Medicine

## 2012-04-12 NOTE — Telephone Encounter (Signed)
Pt is aware of OV on 2/11 at 1030 with KJ and appt card was mailed

## 2012-04-29 ENCOUNTER — Encounter: Payer: Self-pay | Admitting: Internal Medicine

## 2012-05-04 ENCOUNTER — Encounter: Payer: Self-pay | Admitting: Urgent Care

## 2012-05-04 ENCOUNTER — Ambulatory Visit (INDEPENDENT_AMBULATORY_CARE_PROVIDER_SITE_OTHER): Payer: BC Managed Care – PPO | Admitting: Urgent Care

## 2012-05-04 VITALS — BP 139/78 | HR 74 | Temp 98.4°F | Ht 65.0 in | Wt 173.4 lb

## 2012-05-04 DIAGNOSIS — K219 Gastro-esophageal reflux disease without esophagitis: Secondary | ICD-10-CM

## 2012-05-04 DIAGNOSIS — K589 Irritable bowel syndrome without diarrhea: Secondary | ICD-10-CM

## 2012-05-04 MED ORDER — OMEPRAZOLE MAGNESIUM 20 MG PO TBEC: 20.0000 mg | DELAYED_RELEASE_TABLET | Freq: Every day | ORAL | Status: DC

## 2012-05-04 NOTE — Patient Instructions (Addendum)
Continue prilosec 20mg  daily for acid reflux Limit ADVIL USE & take with food Use fiber supplement of choice daily or as needed Use OTC colace stool softeners daily as needed for hard stools Office visit in 1 yr or sooner if needed

## 2012-05-04 NOTE — Progress Notes (Signed)
Referring Provider: Elfredia Nevins, MD Primary Care Physician:  Cassell Smiles., MD Primary Gastroenterologist:  Dr. Jena Gauss  Chief Complaint  Patient presents with  . Follow-up    GERD, IBS    HPI:  Ruth Warner is a 57 y.o. female here for follow up for GERD & IBS.  She has been doing very well.  She reports her stools vary between [facility] #1 & #5.  Occasional hard stools.  C/o intermittent LUQ pain or bilat lower abdomen cramps that resolves with defecation.  Denies rectal bleeding or melena.  Uses anusol intermittently for hemorrhoids.  She is not taking anything for IBS.  Her appetite has been fine.  She has hearturn 1-2 times per month. She takes prilosec 20mg  daily,  She takes Advil for neck pain several per day & she thinks this may be upsetting her stomach.  At times she feels her heart racing & palpitations.  This started after she got a Keurig coffee maker & is drinking more than her normal 2-3 cups of coffee per day.  She sees Dr. Cassell Smiles., MD for routine health exams & labs.  Past Medical History  Diagnosis Date  . GERD (gastroesophageal reflux disease)   . IBS (irritable bowel syndrome)   . Concussion     staples, hit a shelf  . S/P colonoscopy 2001    Dr. Jena Gauss: normal. Repeat 10 years  . S/P endoscopy 2001    Dr. Jena Gauss: normal    Past Surgical History  Procedure Laterality Date  . Tonsillectomy    . Colonoscopy  07/30/2010    [place]k: Single diminutive hyperplastic polyp in the mid descending colon status post cold biopsy removal    Current Outpatient Prescriptions  Medication Sig Dispense Refill  . Ascorbic Acid (VITAMIN C) 1000 MG tablet Take 1,000 mg by mouth daily.        . calcium carbonate (TUMS - DOSED IN MG ELEMENTAL CALCIUM) 500 MG chewable tablet Chew 1 tablet by mouth daily.        . Cholecalciferol (VITAMIN D) 2000 UNITS tablet Take 2,000 Units by mouth daily.        . Multiple Vitamin (MULTIVITAMIN) capsule Take 1 capsule by mouth daily.         . Omega-3 Fatty Acids (FISH OIL) 1000 MG CAPS Take by mouth 3 (three) times daily.        Marland Kitchen omeprazole (PRILOSEC OTC) 20 MG tablet Take 1 tablet (20 mg total) by mouth daily.  42 tablet  5  . omeprazole (PRILOSEC) 20 MG capsule Take 20 mg by mouth daily.        . vitamin B-12 (CYANOCOBALAMIN) 1000 MCG tablet Take 1,000 mcg by mouth daily.        . vitamin E 1000 UNIT capsule Take 1,000 Units by mouth daily.         No current facility-administered medications for this visit.    Allergies as of 05/04/2012 - Review Complete 05/04/2012  Allergen Reaction Noted  . Penicillins      Review of Systems: Gen: Denies any fever, chills, sweats, anorexia, fatigue, weakness, malaise, weight loss, and sleep disorder CV: Denies chest pain, angina, palpitations, syncope, orthopnea, PND, peripheral edema, and claudication. Resp: Denies dyspnea at rest, dyspnea with exercise, cough, sputum, wheezing, coughing up blood, and pleurisy. GI: Denies vomiting blood, jaundice, and fecal incontinence.   Denies dysphagia or odynophagia. Derm: Denies rash, itching, dry skin, hives, moles, warts, or unhealing ulcers.  Psych: Denies depression, anxiety, memory  loss, suicidal ideation, hallucinations, paranoia, and confusion. Heme: Denies bruising, bleeding, and enlarged lymph nodes.  Physical Exam: BP 139/78  Pulse 74  Temp(Src) 98.4 F (36.9 C) (Oral)  Ht 5\' 5"  (1.651 m)  Wt 173 lb 6.4 oz (78.654 kg)  BMI 28.86 kg/m2 General:   Alert,  Well-developed, well-nourished, pleasant and cooperative in NAD Eyes:  Sclera clear, no icterus.   Conjunctiva pink. Mouth:  No deformity or lesions, oropharynx pink and moist. Neck:  Supple; no masses or thyromegaly. Heart:  Regular rate and rhythm; no murmurs, clicks, rubs,  or gallops. Abdomen:  Normal bowel sounds.  No bruits.  Soft, non-tender and non-distended without masses, hepatosplenomegaly or hernias noted.  No guarding or rebound tenderness.   Rectal:   Deferred.  Msk:  Symmetrical without gross deformities.  Pulses:  Normal pulses noted. Extremities:  No clubbing or edema. Neurologic:  Alert and oriented x4;  grossly normal neurologically. Skin:  Intact without significant lesions or rashes.

## 2012-05-05 ENCOUNTER — Encounter: Payer: Self-pay | Admitting: Urgent Care

## 2012-05-05 NOTE — Progress Notes (Signed)
Faxed to PCP

## 2012-05-05 NOTE — Assessment & Plan Note (Signed)
Well-controlled on prilosec 20mg  daily  Limit ADVIL USE & take with food

## 2012-05-05 NOTE — Assessment & Plan Note (Addendum)
She is doing well.  Occasional hard stools.  Use fiber supplement of choice daily or as needed Use OTC colace stool softeners daily as needed for hard stools Office visit in 1 yr or sooner if needed

## 2013-02-21 ENCOUNTER — Other Ambulatory Visit: Payer: Self-pay | Admitting: Adult Health

## 2013-02-24 ENCOUNTER — Other Ambulatory Visit: Payer: Self-pay | Admitting: Adult Health

## 2013-03-24 DIAGNOSIS — N816 Rectocele: Secondary | ICD-10-CM

## 2013-03-24 DIAGNOSIS — N8189 Other female genital prolapse: Secondary | ICD-10-CM

## 2013-03-24 HISTORY — DX: Other female genital prolapse: N81.89

## 2013-03-24 HISTORY — DX: Rectocele: N81.6

## 2013-03-28 ENCOUNTER — Other Ambulatory Visit: Payer: Self-pay | Admitting: Urgent Care

## 2013-04-11 ENCOUNTER — Other Ambulatory Visit: Payer: Self-pay | Admitting: Adult Health

## 2013-04-12 ENCOUNTER — Encounter: Payer: Self-pay | Admitting: Adult Health

## 2013-04-12 ENCOUNTER — Ambulatory Visit (INDEPENDENT_AMBULATORY_CARE_PROVIDER_SITE_OTHER): Payer: BC Managed Care – PPO | Admitting: Adult Health

## 2013-04-12 ENCOUNTER — Encounter (INDEPENDENT_AMBULATORY_CARE_PROVIDER_SITE_OTHER): Payer: Self-pay

## 2013-04-12 ENCOUNTER — Other Ambulatory Visit (HOSPITAL_COMMUNITY)
Admission: RE | Admit: 2013-04-12 | Discharge: 2013-04-12 | Disposition: A | Discharge status: Home / Self Care | Payer: BC Managed Care – PPO | Source: Ambulatory Visit | Attending: Adult Health | Admitting: Adult Health

## 2013-04-12 VITALS — BP 120/80 | HR 74 | Ht 63.0 in | Wt 163.0 lb

## 2013-04-12 DIAGNOSIS — Z1151 Encounter for screening for human papillomavirus (HPV): Secondary | ICD-10-CM | POA: Insufficient documentation

## 2013-04-12 DIAGNOSIS — R519 Headache, unspecified: Secondary | ICD-10-CM | POA: Insufficient documentation

## 2013-04-12 DIAGNOSIS — Z01419 Encounter for gynecological examination (general) (routine) without abnormal findings: Secondary | ICD-10-CM

## 2013-04-12 DIAGNOSIS — N816 Rectocele: Secondary | ICD-10-CM

## 2013-04-12 DIAGNOSIS — Z124 Encounter for screening for malignant neoplasm of cervix: Secondary | ICD-10-CM | POA: Insufficient documentation

## 2013-04-12 DIAGNOSIS — N8189 Other female genital prolapse: Secondary | ICD-10-CM

## 2013-04-12 DIAGNOSIS — R51 Headache: Secondary | ICD-10-CM

## 2013-04-12 DIAGNOSIS — Z1212 Encounter for screening for malignant neoplasm of rectum: Secondary | ICD-10-CM

## 2013-04-12 LAB — HEMOCCULT GUIAC POC 1CARD (OFFICE): Fecal Occult Blood, POC: NEGATIVE

## 2013-04-12 MED ORDER — HYDROCODONE-ACETAMINOPHEN 5-325 MG PO TABS: 1.0000 | ORAL_TABLET | Freq: Four times a day (QID) | ORAL | Status: DC | PRN

## 2013-04-12 NOTE — Patient Instructions (Signed)
Physical in 1 year Mammogram yearly  Labs at work Headaches, Frequently Asked Questions MIGRAINE HEADACHES Q: What is migraine? What causes it? How can I treat it? A: Generally, migraine headaches begin as a dull ache. Then they develop into a constant, throbbing, and pulsating pain. You may experience pain at the temples. You may experience pain at the front or back of one or both sides of the head. The pain is usually accompanied by a combination of:  Nausea.  Vomiting.  Sensitivity to light and noise. Some people (about 15%) experience an aura (see below) before an attack. The cause of migraine is believed to be chemical reactions in the brain. Treatment for migraine may include over-the-counter or prescription medications. It may also include self-help techniques. These include relaxation training and biofeedback.  Q: What is an aura? A: About 15% of people with migraine get an "aura". This is a sign of neurological symptoms that occur before a migraine headache. You may see wavy or jagged lines, dots, or flashing lights. You might experience tunnel vision or blind spots in one or both eyes. The aura can include visual or auditory hallucinations (something imagined). It may include disruptions in smell (such as strange odors), taste or touch. Other symptoms include:  Numbness.  A "pins and needles" sensation.  Difficulty in recalling or speaking the correct word. These neurological events may last as long as 60 minutes. These symptoms will fade as the headache begins. Q: What is a trigger? A: Certain physical or environmental factors can lead to or "trigger" a migraine. These include:  Foods.  Hormonal changes.  Weather.  Stress. It is important to remember that triggers are different for everyone. To help prevent migraine attacks, you need to figure out which triggers affect you. Keep a headache diary. This is a good way to track triggers. The diary will help you talk to your  healthcare professional about your condition. Q: Does weather affect migraines? A: Bright sunshine, hot, humid conditions, and drastic changes in barometric pressure may lead to, or "trigger," a migraine attack in some people. But studies have shown that weather does not act as a trigger for everyone with migraines. Q: What is the link between migraine and hormones? A: Hormones start and regulate many of your body's functions. Hormones keep your body in balance within a constantly changing environment. The levels of hormones in your body are unbalanced at times. Examples are during menstruation, pregnancy, or menopause. That can lead to a migraine attack. In fact, about three quarters of all women with migraine report that their attacks are related to the menstrual cycle.  Q: Is there an increased risk of stroke for migraine sufferers? A: The likelihood of a migraine attack causing a stroke is very remote. That is not to say that migraine sufferers cannot have a stroke associated with their migraines. In persons under age 60, the most common associated factor for stroke is migraine headache. But over the course of a person's normal life span, the occurrence of migraine headache may actually be associated with a reduced risk of dying from cerebrovascular disease due to stroke.  Q: What are acute medications for migraine? A: Acute medications are used to treat the pain of the headache after it has started. Examples over-the-counter medications, NSAIDs, ergots, and triptans.  Q: What are the triptans? A: Triptans are the newest class of abortive medications. They are specifically targeted to treat migraine. Triptans are vasoconstrictors. They moderate some chemical reactions in the brain.  The triptans work on receptors in your brain. Triptans help to restore the balance of a neurotransmitter called serotonin. Fluctuations in levels of serotonin are thought to be a main cause of migraine.  Q: Are  over-the-counter medications for migraine effective? A: Over-the-counter, or "OTC," medications may be effective in relieving mild to moderate pain and associated symptoms of migraine. But you should see your caregiver before beginning any treatment regimen for migraine.  Q: What are preventive medications for migraine? A: Preventive medications for migraine are sometimes referred to as "prophylactic" treatments. They are used to reduce the frequency, severity, and length of migraine attacks. Examples of preventive medications include antiepileptic medications, antidepressants, beta-blockers, calcium channel blockers, and NSAIDs (nonsteroidal anti-inflammatory drugs). Q: Why are anticonvulsants used to treat migraine? A: During the past few years, there has been an increased interest in antiepileptic drugs for the prevention of migraine. They are sometimes referred to as "anticonvulsants". Both epilepsy and migraine may be caused by similar reactions in the brain.  Q: Why are antidepressants used to treat migraine? A: Antidepressants are typically used to treat people with depression. They may reduce migraine frequency by regulating chemical levels, such as serotonin, in the brain.  Q: What alternative therapies are used to treat migraine? A: The term "alternative therapies" is often used to describe treatments considered outside the scope of conventional Western medicine. Examples of alternative therapy include acupuncture, acupressure, and yoga. Another common alternative treatment is herbal therapy. Some herbs are believed to relieve headache pain. Always discuss alternative therapies with your caregiver before proceeding. Some herbal products contain arsenic and other toxins. TENSION HEADACHES Q: What is a tension-type headache? What causes it? How can I treat it? A: Tension-type headaches occur randomly. They are often the result of temporary stress, anxiety, fatigue, or anger. Symptoms include  soreness in your temples, a tightening band-like sensation around your head (a "vice-like" ache). Symptoms can also include a pulling feeling, pressure sensations, and contracting head and neck muscles. The headache begins in your forehead, temples, or the back of your head and neck. Treatment for tension-type headache may include over-the-counter or prescription medications. Treatment may also include self-help techniques such as relaxation training and biofeedback. CLUSTER HEADACHES Q: What is a cluster headache? What causes it? How can I treat it? A: Cluster headache gets its name because the attacks come in groups. The pain arrives with little, if any, warning. It is usually on one side of the head. A tearing or bloodshot eye and a runny nose on the same side of the headache may also accompany the pain. Cluster headaches are believed to be caused by chemical reactions in the brain. They have been described as the most severe and intense of any headache type. Treatment for cluster headache includes prescription medication and oxygen. SINUS HEADACHES Q: What is a sinus headache? What causes it? How can I treat it? A: When a cavity in the bones of the face and skull (a sinus) becomes inflamed, the inflammation will cause localized pain. This condition is usually the result of an allergic reaction, a tumor, or an infection. If your headache is caused by a sinus blockage, such as an infection, you will probably have a fever. An x-ray will confirm a sinus blockage. Your caregiver's treatment might include antibiotics for the infection, as well as antihistamines or decongestants.  REBOUND HEADACHES Q: What is a rebound headache? What causes it? How can I treat it? A: A pattern of taking acute headache medications too  often can lead to a condition known as "rebound headache." A pattern of taking too much headache medication includes taking it more than 2 days per week or in excessive amounts. That means more  than the label or a caregiver advises. With rebound headaches, your medications not only stop relieving pain, they actually begin to cause headaches. Doctors treat rebound headache by tapering the medication that is being overused. Sometimes your caregiver will gradually substitute a different type of treatment or medication. Stopping may be a challenge. Regularly overusing a medication increases the potential for serious side effects. Consult a caregiver if you regularly use headache medications more than 2 days per week or more than the label advises. ADDITIONAL QUESTIONS AND ANSWERS Q: What is biofeedback? A: Biofeedback is a self-help treatment. Biofeedback uses special equipment to monitor your body's involuntary physical responses. Biofeedback monitors:  Breathing.  Pulse.  Heart rate.  Temperature.  Muscle tension.  Brain activity. Biofeedback helps you refine and perfect your relaxation exercises. You learn to control the physical responses that are related to stress. Once the technique has been mastered, you do not need the equipment any more. Q: Are headaches hereditary? A: Four out of five (80%) of people that suffer report a family history of migraine. Scientists are not sure if this is genetic or a family predisposition. Despite the uncertainty, a child has a 50% chance of having migraine if one parent suffers. The child has a 75% chance if both parents suffer.  Q: Can children get headaches? A: By the time they reach high school, most young people have experienced some type of headache. Many safe and effective approaches or medications can prevent a headache from occurring or stop it after it has begun.  Q: What type of doctor should I see to diagnose and treat my headache? A: Start with your primary caregiver. Discuss his or her experience and approach to headaches. Discuss methods of classification, diagnosis, and treatment. Your caregiver may decide to recommend you to a  headache specialist, depending upon your symptoms or other physical conditions. Having diabetes, allergies, etc., may require a more comprehensive and inclusive approach to your headache. The National Headache Foundation will provide, upon request, a list of [facility] physician members in your state. Document Released: 05/31/2003 Document Revised: 06/02/2011 Document Reviewed: 11/08/2007 [facility] Patient Information 2014 [place].

## 2013-04-12 NOTE — Progress Notes (Signed)
Patient ID: Ruth Warner, female   DOB: 05-20-1955, 58 y.o.   MRN: [id number] History of Present Illness: Ruth Warner is a 58 year old white female, married in for a pap and physical.Got flu shot at work and gets labs at work.   Current Medications, Allergies, Past Medical History, Past Surgical History, Family History and Social History were reviewed in [employer] record.     Review of Systems: Patient denies any blurred vision, shortness of breath, chest pain, abdominal pain, problems with bowel movements, urination, or intercourse. No joint pain, back hurts at times, no mood swings, has headaches occasionally,usually take Advil but if really bad uses hydrocodone with relief, and has pelvic pressure at times.    Physical Exam:BP 120/80  Pulse 74  Ht 5\' 3"  (1.6 m)  Wt 163 lb (73.936 kg)  BMI 28.88 kg/m2 General:  Well developed, well nourished, no acute distress Skin:  Warm and dry Neck:  Midline trachea, normal thyroid Lungs; Clear to auscultation bilaterally Breast:  No dominant palpable mass, retraction, or nipple discharge, has some regular irregularities  bilaterally  Cardiovascular: Regular rate and rhythm Abdomen:  Soft, non tender, no hepatosplenomegaly Pelvic:  External genitalia is normal in appearance.  The vagina is normal in appearance for age.               The cervix is atrophic, pap with HPV performed.  Uterus is felt to be normal size, shape, and contour.Has some pelvic relaxation.  No  adnexal masses or tenderness noted. Rectal: Good sphincter tone, no polyps, or hemorrhoids felt.  Hemoccult negative.+rectocele. Extremities:  No swelling  noted Psych:  No mood changes, alert and cooperative, seems happy, works for school system   Impression: Yearly gyn exam Pelvic relaxation Headaches Rectocele     Plan: Physical in 1 year Mammogram yearly  Labs at work Colonoscopy per GI Rx norco 5/325 mg #40 1 every 6 hours prn no refills

## 2013-09-19 ENCOUNTER — Telehealth: Payer: Self-pay | Admitting: Adult Health

## 2013-09-19 MED ORDER — HYDROCODONE-ACETAMINOPHEN 5-325 MG PO TABS: 1.0000 | ORAL_TABLET | Freq: Four times a day (QID) | ORAL | Status: DC | PRN

## 2013-09-19 NOTE — Telephone Encounter (Signed)
Will have occasional headache and uses hydrocodone with good results will refill

## 2014-01-23 ENCOUNTER — Encounter: Payer: Self-pay | Admitting: Adult Health

## 2014-02-09 ENCOUNTER — Ambulatory Visit: Payer: BC Managed Care – PPO | Admitting: Adult Health

## 2014-02-14 ENCOUNTER — Ambulatory Visit (INDEPENDENT_AMBULATORY_CARE_PROVIDER_SITE_OTHER): Payer: BC Managed Care – PPO | Admitting: Adult Health

## 2014-02-14 ENCOUNTER — Encounter: Payer: Self-pay | Admitting: Adult Health

## 2014-02-14 VITALS — BP 120/70 | Ht 63.0 in | Wt 169.0 lb

## 2014-02-14 DIAGNOSIS — G8929 Other chronic pain: Secondary | ICD-10-CM

## 2014-02-14 DIAGNOSIS — R51 Headache: Secondary | ICD-10-CM

## 2014-02-14 MED ORDER — HYDROCODONE-ACETAMINOPHEN 5-325 MG PO TABS: 1.0000 | ORAL_TABLET | Freq: Four times a day (QID) | ORAL | Status: DC | PRN

## 2014-02-14 NOTE — Progress Notes (Signed)
Subjective:     Patient ID: Ruth Warner, female   DOB: 05-Dec-1955, 58 y.o.   MRN: [id number]  HPI Ruth Warner is a 58 year old white female in for refill on Norco, she uses for headaches,but not often.  Review of Systems See HPI Reviewed past medical,surgical, social and family history. Reviewed medications and allergies.     Objective:   Physical Exam BP 120/70 mmHg  Ht 5\' 3"  (1.6 m)  Wt 169 lb (76.658 kg)  BMI 29.94 kg/m2   Doing well but needs refill on norco, uses for headaches.  Assessment:     Chronic headaches    Plan:    Review handout on headaches Rx norco 5-325 mg #40 1 every 6 hours prn headache, no refills Follow up prn

## 2014-02-14 NOTE — Patient Instructions (Signed)
General Headache Without Cause A headache is pain or discomfort felt around the head or neck area. The specific cause of a headache may not be found. There are many causes and types of headaches. A few common ones are:  Tension headaches.  Migraine headaches.  Cluster headaches.  Chronic daily headaches. HOME CARE INSTRUCTIONS   Keep all follow-up appointments with your caregiver or any specialist referral.  Only take over-the-counter or prescription medicines for pain or discomfort as directed by your caregiver.  Lie down in a dark, quiet room when you have a headache.  Keep a headache journal to find out what may trigger your migraine headaches. For example, write down:  What you eat and drink.  How much sleep you get.  Any change to your diet or medicines.  Try massage or other relaxation techniques.  Put ice packs or heat on the head and neck. Use these 3 to 4 times per day for 15 to 20 minutes each time, or as needed.  Limit stress.  Sit up straight, and do not tense your muscles.  Quit smoking if you smoke.  Limit alcohol use.  Decrease the amount of caffeine you drink, or stop drinking caffeine.  Eat and sleep on a regular schedule.  Get 7 to 9 hours of sleep, or as recommended by your caregiver.  Keep lights dim if bright lights bother you and make your headaches worse. SEEK MEDICAL CARE IF:   You have problems with the medicines you were prescribed.  Your medicines are not working.  You have a change from the usual headache.  You have nausea or vomiting. SEEK IMMEDIATE MEDICAL CARE IF:   Your headache becomes severe.  You have a fever.  You have a stiff neck.  You have loss of vision.  You have muscular weakness or loss of muscle control.  You start losing your balance or have trouble walking.  You feel faint or pass out.  You have severe symptoms that are different from your first symptoms. MAKE SURE YOU:   Understand these  instructions.  Will watch your condition.  Will get help right away if you are not doing well or get worse. Document Released: 03/10/2005 Document Revised: 06/02/2011 Document Reviewed: 03/26/2011 [facility] Patient Information 2015 [place], [place]. This information is not intended to replace advice given to you by your health care provider. Make sure you discuss any questions you have with your health care provider. Follow up prn

## 2014-04-17 ENCOUNTER — Other Ambulatory Visit: Payer: BC Managed Care – PPO | Admitting: Adult Health

## 2014-05-06 ENCOUNTER — Other Ambulatory Visit: Payer: Self-pay | Admitting: Gastroenterology

## 2014-05-09 ENCOUNTER — Encounter: Payer: Self-pay | Admitting: Internal Medicine

## 2014-05-09 NOTE — Telephone Encounter (Signed)
No appt in 2 years, note sent to scheduling for an OV.

## 2014-05-09 NOTE — Telephone Encounter (Signed)
APPOINTMENT MADE AND LETTER SENT °

## 2014-05-09 NOTE — Telephone Encounter (Signed)
I sent in 30 days worth but they're at 2 years without a visit and need a follow-up.

## 2014-05-22 ENCOUNTER — Encounter: Payer: Self-pay | Admitting: Adult Health

## 2014-05-22 ENCOUNTER — Ambulatory Visit (INDEPENDENT_AMBULATORY_CARE_PROVIDER_SITE_OTHER): Payer: BC Managed Care – PPO | Admitting: Adult Health

## 2014-05-22 VITALS — BP 130/70 | HR 75 | Ht 63.25 in | Wt 167.5 lb

## 2014-05-22 DIAGNOSIS — Z01419 Encounter for gynecological examination (general) (routine) without abnormal findings: Secondary | ICD-10-CM | POA: Diagnosis not present

## 2014-05-22 DIAGNOSIS — Z1212 Encounter for screening for malignant neoplasm of rectum: Secondary | ICD-10-CM

## 2014-05-22 DIAGNOSIS — R51 Headache: Secondary | ICD-10-CM

## 2014-05-22 DIAGNOSIS — G8929 Other chronic pain: Secondary | ICD-10-CM

## 2014-05-22 DIAGNOSIS — N8189 Other female genital prolapse: Secondary | ICD-10-CM

## 2014-05-22 DIAGNOSIS — N816 Rectocele: Secondary | ICD-10-CM

## 2014-05-22 LAB — HEMOCCULT GUIAC POC 1CARD (OFFICE): Fecal Occult Blood, POC: NEGATIVE

## 2014-05-22 MED ORDER — HYDROCODONE-ACETAMINOPHEN 5-325 MG PO TABS: 1.0000 | ORAL_TABLET | Freq: Four times a day (QID) | ORAL | Status: DC | PRN

## 2014-05-22 NOTE — Patient Instructions (Signed)
Get mammogram Physical in 1 year Labs at work

## 2014-05-22 NOTE — Progress Notes (Signed)
Patient ID: Ruth Warner, female   DOB: 08-06-1955, 59 y.o.   MRN: [id number] History of Present Illness: Ruth Warner is a 59 year old white female, married in for well woman gyn exam.She had a normal pap with negative HPV 04/12/13.    Current Medications, Allergies, Past Medical History, Past Surgical History, Family History and Social History were reviewed in [employer] record.     Review of Systems: Patient denies any daily headaches(has chronic headaches but says they are better), hearing loss, fatigue, blurred vision, shortness of breath, chest pain, abdominal pain, problems with intercourse. No joint pain or mood swings.She has known rectocele and has problems at times getting BMs out with out help, also has some SUI and will finish voiding and wipes and has to pee again.Has pressure at times.She says she is cold all the time, will get labs at work told to check thyroid then too.    Physical Exam:BP 130/70 mmHg  Pulse 75  Ht 5' 3.25" (1.607 m)  Wt 167 lb 8 oz (75.978 kg)  BMI 29.42 kg/m2 General:  Well developed, well nourished, no acute distress Skin:  Warm and dry Neck:  Midline trachea, normal thyroid, good ROM, no lymphadenopathy Lungs; Clear to auscultation bilaterally Breast:  No dominant palpable mass, retraction, or nipple discharge Cardiovascular: Regular rate and rhythm Abdomen:  Soft, non tender, no hepatosplenomegaly Pelvic:  External genitalia is normal in appearance, no lesions.  The vagina is normal in appearance, but has some loss of moisture and rugae. Urethra has no lesions or masses. The cervix is bulbous. Has first degree relaxation Uterus is felt to be normal size, shape, and contour.  No adnexal masses or tenderness noted.Bladder is non tender, no masses felt. Rectal: Good sphincter tone, no polyps, or hemorrhoids felt.  Hemoccult negative.has low rectocele Extremities/musculoskeletal:  No swelling or varicosities noted, no clubbing or  cyanosis Psych:  No mood changes, alert and cooperative,seems happy Discussed double voiding, and pessary and even surgery if systems get worse, will watch for now.  Impression: Well woman gyn exam no pap Pelvic relaxation Rectocele Chronic headaches    Plan: Physical in 1 year Get mammogram now and yearly  Labs at work Refilled norco 5-325 mg #40 1 every 4-6 hours prn moderate pain no refills

## 2014-06-07 ENCOUNTER — Ambulatory Visit: Payer: BC Managed Care – PPO | Admitting: Nurse Practitioner

## 2014-06-14 ENCOUNTER — Other Ambulatory Visit: Payer: Self-pay | Admitting: Adult Health

## 2014-06-14 DIAGNOSIS — Z1231 Encounter for screening mammogram for malignant neoplasm of breast: Secondary | ICD-10-CM

## 2014-06-19 ENCOUNTER — Ambulatory Visit (INDEPENDENT_AMBULATORY_CARE_PROVIDER_SITE_OTHER): Payer: BC Managed Care – PPO | Admitting: Nurse Practitioner

## 2014-06-19 ENCOUNTER — Encounter: Payer: Self-pay | Admitting: Nurse Practitioner

## 2014-06-19 VITALS — BP 120/65 | HR 75 | Temp 98.3°F | Ht 63.0 in | Wt 171.2 lb

## 2014-06-19 DIAGNOSIS — G8929 Other chronic pain: Secondary | ICD-10-CM | POA: Insufficient documentation

## 2014-06-19 DIAGNOSIS — K219 Gastro-esophageal reflux disease without esophagitis: Secondary | ICD-10-CM | POA: Diagnosis not present

## 2014-06-19 DIAGNOSIS — R1013 Epigastric pain: Secondary | ICD-10-CM

## 2014-06-19 NOTE — Assessment & Plan Note (Addendum)
Having increased GERD symptoms including epigastric pain, regurgitation of acid. Worsening in the past 2 months. Has been on PPIs for about the past 30-40 years. Prilosec 20 mg daily has worked well for quite some time. However, she may need an increase in dose given exacerbations recently. Will increase her omeprazole to 20 mg bid (30 mins before meals) and follow-up in 3 months. Also, given long-term history of PPI, recommend discussing DXA scan with PCP at next visit. If continued symptoms can consider EGD and other testing for further evaluation.

## 2014-06-19 NOTE — Patient Instructions (Signed)
1. I am increasing your Prilosec to 20 mg twice a day. Take it 30 mins before meals. 2. I encourage you to contact your PCP for an office visit to further evaluate your chest pain. It is likely related to your GERD symptoms, but given your strong family history it is prudent to have it fully evaluated. 3. You can also inquire about a DXA scan (bone density scan) at that time related to long term PPI use. 4. Follow-up in 3 months to re-evaluate how you're doing and make any changes needed.

## 2014-06-19 NOTE — Assessment & Plan Note (Signed)
Admits increasing epigastric pain which is sometimes substernal. She has dismissed this as GERD symptoms. Likely etiology remains worsening GERD given other symptoms (see GERD A&P). Denies associated dizziness, syncope, near syncope, nausea. However, given her significan family history of cardiac disease and myocardial infarction it would be prudent to recommend f/u with PCP for further evaluation and consideration of a cardiology referral if warranted.

## 2014-06-19 NOTE — Progress Notes (Signed)
cc'ed to pcp °

## 2014-06-19 NOTE — Progress Notes (Addendum)
Referring Provider: Redmond School, MD Primary Care Physician:  Glo Herring., MD Primary GI: Dr. Gala Romney  Chief Complaint  Patient presents with  . Follow-up    HPI:   59 year old female presents for routine follow-up visit and medication refills. Has not been seen in just over 2 years. States she's currently very well controlled on her PPI but will have rapid relapse if she misses a dose. Although she does admit worsening epigastric tenderness postprandial with some acid regurgitation and increased flatus. This is new as of the past 2 weeks. Her constipation is also well controlled. Takes a probiotic. Typically her bowel movements are daily and Bristol scale 4, maybe once a month with some loose stools due to dietary intake. Denies dysphagia, fever, chills, unintentional weight loss, worsening fatigue, weakness, shortness of breath. Has occasional PVCs. Denies any other upper or lower GI symptoms.  Last colonoscopy 07/30/2010 for average risk screening which found adequate prep, normal rectum, single diminutive polyp otherwise normal mucosa. Path on polypectomy was hyperplasia only, recommended repeat in 10 years for screening. Last EGD "many years ago" with Dr. Gala Romney, patient cannot recall the date, no records found in [facility].  Past Medical History  Diagnosis Date  . GERD (gastroesophageal reflux disease)   . IBS (irritable bowel syndrome)   . Concussion     staples, hit a shelf  . S/P colonoscopy 2001    Dr. Gala Romney: normal. Repeat 10 years  . S/P endoscopy 2001    Dr. Gala Romney: normal  . Chronic headaches   . Pelvic relaxation 04/12/2013  . Rectocele 04/12/2013    Past Surgical History  Procedure Laterality Date  . Tonsillectomy    . Colonoscopy  07/30/2010    Rourk: Single diminutive hyperplastic polyp in the mid descending colon status post cold biopsy removal    Current Outpatient Prescriptions  Medication Sig Dispense Refill  . Ascorbic Acid (VITAMIN C) 1000 MG tablet Take  1,000 mg by mouth daily.      . Cholecalciferol (VITAMIN D) 2000 UNITS tablet Take 2,000 Units by mouth daily.      . COCONUT OIL PO Take 2 tablets by mouth daily.    Marland Kitchen HYDROcodone-acetaminophen (NORCO/VICODIN) 5-325 MG per tablet Take 1 tablet by mouth every 6 (six) hours as needed for moderate pain. 40 tablet 0  . Multiple Vitamin (MULTIVITAMIN) capsule Take 1 capsule by mouth daily.      . Omega-3 Fatty Acids (FISH OIL) 1000 MG CAPS Take by mouth 2 (two) times daily.     Marland Kitchen PRILOSEC OTC 20 MG tablet TAKE ONE TABLET BY MOUTH ONCE DAILY. 30 tablet 0  . Probiotic Product (PROBIOTIC DAILY PO) Take 1 tablet by mouth daily.    Marland Kitchen UNABLE TO FIND daily. Fruit, Veg Vit    . UNABLE TO FIND 1 capsule. Med Name: magnesium     No current facility-administered medications for this visit.    Allergies as of 06/19/2014 - Review Complete 06/19/2014  Allergen Reaction Noted  . Penicillins      Family History  Problem Relation Age of Onset  . Colon cancer Paternal Grandfather   . Heart attack Mother   . Diabetes Father   . Heart attack Father   . Alzheimer's disease Father     History   Social History  . Marital Status: Married    Spouse Name: N/A  . Number of Children: N/A  . Years of Education: N/A   Occupational History  . Full-Time  [employer]   Social History Main Topics  . Smoking status: Never Smoker   . Smokeless tobacco: Never Used  . Alcohol Use: No  . Drug Use: No  . Sexual Activity: Yes    Birth Control/ Protection: Post-menopausal, Other-see comments     Comment: vasectomy   Other Topics Concern  . None   Social History Narrative    Review of Systems: Gen: Denies fever, chills, anorexia. Denies fatigue, weakness, weight loss.  CV: Denies syncope, peripheral edema. Admits occasional substernal chest pain which she attributes to her GERD symptoms. Occasional PVCs as noted in HPI. Resp: Denies dyspnea at rest, wheezing, coughing up  blood. GI: Denies vomiting blood, jaundice, and fecal incontinence. Denies dysphagia or odynophagia. Derm: Denies rash, itching, dry skin Psych: Denies depression, anxiety, memory loss, confusion. No homicidal or suicidal ideation.  Heme: Denies bruising, bleeding, and enlarged lymph nodes.  Physical Exam: BP 120/65 mmHg  Pulse 75  Temp(Src) 98.3 F (36.8 C)  Ht 5\' 3"  (1.6 m)  Wt 171 lb 3.2 oz (77.656 kg)  BMI 30.33 kg/m2 General:   Alert and oriented. No distress noted. Pleasant and cooperative.  Head:  Normocephalic and atraumatic. Eyes:  Conjuctiva clear without scleral icterus. Mouth:  Oral mucosa pink and moist. No lesions. Neck:  Supple, without mass or thyromegaly. Lungs:  Clear to auscultation bilaterally. No wheezes, rales, or rhonchi. No distress.  Heart:  S1, S2 present without murmurs, rubs, or gallops. Regular rate and rhythm. Abdomen:  +BS, soft, and non-distended. Mild epigastric TTP. No rebound or guarding. No HSM or masses noted. Extremities:  Without edema. Neurologic:  Alert and  oriented x4;  grossly normal neurologically. Skin:  Intact without significant lesions or rashes. Psych:  Alert and cooperative. Normal mood and affect.    06/19/2014 10:23 AM

## 2014-06-26 ENCOUNTER — Inpatient Hospital Stay (HOSPITAL_COMMUNITY): Admission: RE | Admit: 2014-06-26 | Payer: BC Managed Care – PPO | Source: Ambulatory Visit

## 2014-07-12 ENCOUNTER — Ambulatory Visit (HOSPITAL_COMMUNITY)
Admission: RE | Admit: 2014-07-12 | Discharge: 2014-07-12 | Disposition: A | Discharge status: Home / Self Care | Payer: BC Managed Care – PPO | Source: Ambulatory Visit | Attending: Adult Health | Admitting: Adult Health

## 2014-07-12 DIAGNOSIS — Z1231 Encounter for screening mammogram for malignant neoplasm of breast: Secondary | ICD-10-CM | POA: Diagnosis present

## 2014-07-17 ENCOUNTER — Other Ambulatory Visit: Payer: Self-pay | Admitting: Adult Health

## 2014-07-17 DIAGNOSIS — R928 Other abnormal and inconclusive findings on diagnostic imaging of breast: Secondary | ICD-10-CM

## 2014-07-28 ENCOUNTER — Ambulatory Visit
Admission: RE | Admit: 2014-07-28 | Discharge: 2014-07-28 | Disposition: A | Discharge status: Home / Self Care | Payer: BC Managed Care – PPO | Source: Ambulatory Visit | Attending: Adult Health | Admitting: Adult Health

## 2014-07-28 ENCOUNTER — Other Ambulatory Visit: Payer: Self-pay | Admitting: Nurse Practitioner

## 2014-07-28 DIAGNOSIS — R928 Other abnormal and inconclusive findings on diagnostic imaging of breast: Secondary | ICD-10-CM

## 2014-08-07 ENCOUNTER — Encounter: Payer: Self-pay | Admitting: Internal Medicine

## 2014-09-19 ENCOUNTER — Telehealth: Payer: Self-pay | Admitting: Adult Health

## 2014-09-19 MED ORDER — HYDROCORTISONE ACE-PRAMOXINE 2.5-1 % RE CREA: 1.0000 "application " | TOPICAL_CREAM | Freq: Three times a day (TID) | RECTAL | Status: DC

## 2014-09-19 NOTE — Telephone Encounter (Signed)
Will refill analpram x 2, she was kidding about xnax

## 2014-11-28 ENCOUNTER — Telehealth: Payer: Self-pay | Admitting: Adult Health

## 2014-11-29 MED ORDER — HYDROCODONE-ACETAMINOPHEN 5-325 MG PO TABS: 1.0000 | ORAL_TABLET | Freq: Four times a day (QID) | ORAL | Status: DC | PRN

## 2014-11-29 NOTE — Telephone Encounter (Signed)
Will refill norco x1

## 2015-01-12 ENCOUNTER — Other Ambulatory Visit: Payer: Self-pay | Admitting: Nurse Practitioner

## 2015-01-15 ENCOUNTER — Telehealth: Payer: Self-pay | Admitting: Internal Medicine

## 2015-01-15 NOTE — Telephone Encounter (Signed)
There is already a refill request from the pharmacy in the refill box.

## 2015-01-15 NOTE — Telephone Encounter (Signed)
Pt called again today asking about her Prilosec prescription being called into [facility]. She has one pill left.

## 2015-01-16 NOTE — Telephone Encounter (Signed)
Completed.

## 2015-02-01 ENCOUNTER — Ambulatory Visit (INDEPENDENT_AMBULATORY_CARE_PROVIDER_SITE_OTHER): Payer: BC Managed Care – PPO | Admitting: Nurse Practitioner

## 2015-02-01 ENCOUNTER — Other Ambulatory Visit: Payer: Self-pay

## 2015-02-01 ENCOUNTER — Encounter: Payer: Self-pay | Admitting: Nurse Practitioner

## 2015-02-01 VITALS — BP 125/72 | HR 71 | Temp 97.9°F | Ht 63.0 in | Wt 171.4 lb

## 2015-02-01 DIAGNOSIS — G8929 Other chronic pain: Secondary | ICD-10-CM

## 2015-02-01 DIAGNOSIS — K219 Gastro-esophageal reflux disease without esophagitis: Secondary | ICD-10-CM

## 2015-02-01 DIAGNOSIS — R1013 Epigastric pain: Secondary | ICD-10-CM | POA: Diagnosis not present

## 2015-02-01 NOTE — Assessment & Plan Note (Signed)
Worsening abdominal pain with increased frequency as noted in history of present illness. She is on PPI twice a day and has not had an endoscopy in many years. This point we'll proceed with an EGD as noted above. Return for follow-up in 3 months. Cardiac workup has been completed and is negative. Cannot rule out worsening esophagitis, gastritis, or possible gastric ulcer.

## 2015-02-01 NOTE — Patient Instructions (Signed)
1. Continue taking your acid blocker. 2. We will schedule your procedure for you. 3. Further recommendations to be based on the results of your procedure. 4. Return for follow-up in 3 months.

## 2015-02-01 NOTE — Progress Notes (Signed)
Referring Provider: Redmond School, MD Primary Care Physician:  Glo Herring., MD Primary GI:  Dr. Gala Romney  Chief Complaint  Patient presents with  . Follow-up    HPI:   59 year old female presents for follow-up on epigastric pain and GERD symptoms. Last seen in our office 06/19/2014. At that time she been on PPI for a number of years, no recent EGD. Felt her PPI was controlling her symptoms well although she was having increased breakthrough symptoms. At that time we increased her vasectomy twice a day and had her come back in 3 months for follow-up on her symptoms.  Today she states her symptoms are unimproved. She is continuing to have epigastric pain which sometimes radiates into her back. She is taking her PPI bid. Her symptoms are worse when postprandially but can happen any time. Pain is a burning pain. She did have 1-2 episodes of diarrhea as well. She'll take antacids as needed, which helps. Symptoms are increasing in frequency. Also with excessive belching and "gurgling" in her throat. Denies other abdominal pain, N/V, hematochezia and melena. Occasional diarrhea. Takes Advil as often 2-3 times a day or as infrequently as once every 2-3 weeks. Denies chest pain, dyspnea, dizziness, lightheadedness, syncope, near syncope. Denies any other upper or lower GI symptoms.  She has seen cardiology to rule out any cardiac involvement for her symptoms as we previously recommended (given family cardiac history), stress test was done and normal.  Past Medical History  Diagnosis Date  . GERD (gastroesophageal reflux disease)   . IBS (irritable bowel syndrome)   . Concussion     staples, hit a shelf  . S/P colonoscopy 2001    Dr. Gala Romney: normal. Repeat 10 years  . S/P endoscopy 2001    Dr. Gala Romney: normal  . Chronic headaches   . Pelvic relaxation 04/12/2013  . Rectocele 04/12/2013    Past Surgical History  Procedure Laterality Date  . Tonsillectomy    . Colonoscopy  07/30/2010   Rourk: Single diminutive hyperplastic polyp in the mid descending colon status post cold biopsy removal  . Esophagogastroduodenoscopy endoscopy      With RMR, unable to remember date.    Current Outpatient Prescriptions  Medication Sig Dispense Refill  . Ascorbic Acid (VITAMIN C) 1000 MG tablet Take 1,000 mg by mouth daily.      . Cholecalciferol (VITAMIN D) 2000 UNITS tablet Take 2,000 Units by mouth daily.      . COCONUT OIL PO Take 2 tablets by mouth daily.    Marland Kitchen HYDROcodone-acetaminophen (NORCO/VICODIN) 5-325 MG per tablet Take 1 tablet by mouth every 6 (six) hours as needed for moderate pain. 40 tablet 0  . hydrocortisone-pramoxine (ANALPRAM-HC) 2.5-1 % rectal cream Place 1 application rectally 3 (three) times daily. 30 g 1  . Multiple Vitamin (MULTIVITAMIN) capsule Take 1 capsule by mouth daily.      . Omega-3 Fatty Acids (FISH OIL) 1000 MG CAPS Take by mouth 2 (two) times daily.     Marland Kitchen PRILOSEC OTC 20 MG tablet TAKE ONE TABLET BY MOUTH ONCE DAILY. 42 tablet 3  . Probiotic Product (PROBIOTIC DAILY PO) Take 1 tablet by mouth daily.    Marland Kitchen UNABLE TO FIND daily. Fruit, Veg Vit    . UNABLE TO FIND 1 capsule. Med Name: magnesium     No current facility-administered medications for this visit.    Allergies as of 02/01/2015 - Review Complete 02/01/2015  Allergen Reaction Noted  . Penicillins  Family History  Problem Relation Age of Onset  . Colon cancer Paternal Grandfather   . Heart attack Mother   . Diabetes Father   . Heart attack Father   . Alzheimer's disease Father     Social History   Social History  . Marital Status: Married    Spouse Name: N/A  . Number of Children: N/A  . Years of Education: N/A   Occupational History  . [employer]   Social History Main Topics  . Smoking status: Never Smoker   . Smokeless tobacco: Never Used  . Alcohol Use: No  . Drug Use: No  . Sexual Activity: Yes    Birth Control/ Protection:  Post-menopausal, Other-see comments     Comment: vasectomy   Other Topics Concern  . None   Social History Narrative    Review of Systems: General: Negative for anorexia, weight loss, fever, chills, fatigue, weakness. ENT: Negative for hoarseness, difficulty swallowing. CV: Negative for chest pain, angina, palpitations, peripheral edema.  Respiratory: Negative for dyspnea at rest, cough, sputum, wheezing.  GI: See history of present illness. Endo: Negative for unusual weight change.    Physical Exam: BP 125/72 mmHg  Pulse 71  Temp(Src) 97.9 F (36.6 C)  Ht 5\' 3"  (1.6 m)  Wt 171 lb 6.4 oz (77.747 kg)  BMI 30.37 kg/m2 General:   Alert and oriented. Pleasant and cooperative. Well-nourished and well-developed.  Cardiovascular:  S1, S2 present without murmurs appreciated. Normal pulses noted. Extremities without clubbing or edema. Respiratory:  Clear to auscultation bilaterally. No wheezes, rales, or rhonchi. No distress.  Gastrointestinal:  +BS, soft, non-tender and non-distended. No HSM noted. No guarding or rebound. No masses appreciated.  Rectal:  Deferred  Neurologic:  Alert and oriented x4;  grossly normal neurologically. Psych:  Alert and cooperative. Normal mood and affect.    02/01/2015 8:24 AM

## 2015-02-01 NOTE — Progress Notes (Signed)
CC'ED TO PCP 

## 2015-02-01 NOTE — Assessment & Plan Note (Signed)
GERD symptoms are persistent and increasing in frequency. She is taking her PPI twice a day. She is having to take more antacids, which do provide some relief. Cardiac workup was negative. Recommend she continue PPI, and at this point we'll proceed with an endoscopy to further evaluate her symptoms. She has been on PPI for an extended period of time and she may need a change of agent. Also, cannot rule out worsening esophagitis or gastritis with possible gastric ulcer given the increasing intensity of her symptoms. Return for follow-up in 3 months.  Proceed with EGD with Dr. Gala Romney in near future: the risks, benefits, and alternatives have been discussed with the patient in detail. The patient states understanding and desires to proceed.  The patient is on hydrocodone which she states she only takes once a week if that. Denies alcohol and drug use. She is not on any anticoagulants, antidepressants, chronic anxiolytic. Colonoscopy completed 2 years ago with conscious sedation without complication. Conscious sedation should be adequate for this procedure.

## 2015-02-12 ENCOUNTER — Ambulatory Visit (HOSPITAL_COMMUNITY)
Admission: RE | Admit: 2015-02-12 | Discharge: 2015-02-12 | Disposition: A | Discharge status: Home / Self Care | Payer: BC Managed Care – PPO | Source: Ambulatory Visit | Attending: Internal Medicine | Admitting: Internal Medicine

## 2015-02-12 ENCOUNTER — Encounter (HOSPITAL_COMMUNITY): Payer: Self-pay | Admitting: *Deleted

## 2015-02-12 ENCOUNTER — Encounter (HOSPITAL_COMMUNITY)
Admission: RE | Disposition: A | Discharge status: Home / Self Care | Payer: Self-pay | Source: Ambulatory Visit | Attending: Internal Medicine

## 2015-02-12 ENCOUNTER — Telehealth: Payer: Self-pay | Admitting: Internal Medicine

## 2015-02-12 DIAGNOSIS — K589 Irritable bowel syndrome without diarrhea: Secondary | ICD-10-CM | POA: Insufficient documentation

## 2015-02-12 DIAGNOSIS — Z79899 Other long term (current) drug therapy: Secondary | ICD-10-CM | POA: Diagnosis not present

## 2015-02-12 DIAGNOSIS — R1013 Epigastric pain: Secondary | ICD-10-CM | POA: Insufficient documentation

## 2015-02-12 DIAGNOSIS — K219 Gastro-esophageal reflux disease without esophagitis: Secondary | ICD-10-CM

## 2015-02-12 HISTORY — PX: ESOPHAGOGASTRODUODENOSCOPY: SHX5428

## 2015-02-12 SURGERY — EGD (ESOPHAGOGASTRODUODENOSCOPY)
Anesthesia: Moderate Sedation

## 2015-02-12 MED ORDER — ONDANSETRON HCL 4 MG/2ML IJ SOLN
INTRAMUSCULAR | Status: AC
  Filled 2015-02-12: qty 2

## 2015-02-12 MED ORDER — STERILE WATER FOR IRRIGATION IR SOLN
Status: DC | PRN
  Administered 2015-02-12: 14:00:00

## 2015-02-12 MED ORDER — ONDANSETRON HCL 4 MG/2ML IJ SOLN
INTRAMUSCULAR | Status: DC | PRN
  Administered 2015-02-12: 4 mg via INTRAVENOUS

## 2015-02-12 MED ORDER — MEPERIDINE HCL 100 MG/ML IJ SOLN
INTRAMUSCULAR | Status: DC | PRN
  Administered 2015-02-12: 50 mg via INTRAVENOUS
  Administered 2015-02-12: 25 mg via INTRAVENOUS

## 2015-02-12 MED ORDER — SODIUM CHLORIDE 0.9 % IV SOLN
INTRAVENOUS | Status: DC
  Administered 2015-02-12: 1000 mL via INTRAVENOUS

## 2015-02-12 MED ORDER — LIDOCAINE VISCOUS 2 % MT SOLN
OROMUCOSAL | Status: DC | PRN
  Administered 2015-02-12: 1 via OROMUCOSAL

## 2015-02-12 MED ORDER — MEPERIDINE HCL 100 MG/ML IJ SOLN
INTRAMUSCULAR | Status: AC
  Filled 2015-02-12: qty 2

## 2015-02-12 MED ORDER — MIDAZOLAM HCL 5 MG/5ML IJ SOLN
INTRAMUSCULAR | Status: DC | PRN
  Administered 2015-02-12 (×2): 2 mg via INTRAVENOUS

## 2015-02-12 MED ORDER — LIDOCAINE VISCOUS 2 % MT SOLN
OROMUCOSAL | Status: AC
  Filled 2015-02-12: qty 15

## 2015-02-12 MED ORDER — MIDAZOLAM HCL 5 MG/5ML IJ SOLN
INTRAMUSCULAR | Status: AC
  Filled 2015-02-12: qty 10

## 2015-02-12 NOTE — Telephone Encounter (Signed)
Christine, RN from short stay called to make PP FU OV in 6-8 weeks and patient will also need a 3 week supply of Dexilant per RMR

## 2015-02-12 NOTE — H&P (View-Only) (Signed)
Referring Provider: Redmond School, MD Primary Care Physician:  Glo Herring., MD Primary GI:  Dr. Gala Romney  Chief Complaint  Patient presents with  . Follow-up    HPI:   59 year old female presents for follow-up on epigastric pain and GERD symptoms. Last seen in our office 06/19/2014. At that time she been on PPI for a number of years, no recent EGD. Felt her PPI was controlling her symptoms well although she was having increased breakthrough symptoms. At that time we increased her vasectomy twice a day and had her come back in 3 months for follow-up on her symptoms.  Today she states her symptoms are unimproved. She is continuing to have epigastric pain which sometimes radiates into her back. She is taking her PPI bid. Her symptoms are worse when postprandially but can happen any time. Pain is a burning pain. She did have 1-2 episodes of diarrhea as well. She'll take antacids as needed, which helps. Symptoms are increasing in frequency. Also with excessive belching and "gurgling" in her throat. Denies other abdominal pain, N/V, hematochezia and melena. Occasional diarrhea. Takes Advil as often 2-3 times a day or as infrequently as once every 2-3 weeks. Denies chest pain, dyspnea, dizziness, lightheadedness, syncope, near syncope. Denies any other upper or lower GI symptoms.  She has seen cardiology to rule out any cardiac involvement for her symptoms as we previously recommended (given family cardiac history), stress test was done and normal.  Past Medical History  Diagnosis Date  . GERD (gastroesophageal reflux disease)   . IBS (irritable bowel syndrome)   . Concussion     staples, hit a shelf  . S/P colonoscopy 2001    Dr. Gala Romney: normal. Repeat 10 years  . S/P endoscopy 2001    Dr. Gala Romney: normal  . Chronic headaches   . Pelvic relaxation 04/12/2013  . Rectocele 04/12/2013    Past Surgical History  Procedure Laterality Date  . Tonsillectomy    . Colonoscopy  07/30/2010   Rourk: Single diminutive hyperplastic polyp in the mid descending colon status post cold biopsy removal  . Esophagogastroduodenoscopy endoscopy      With RMR, unable to remember date.    Current Outpatient Prescriptions  Medication Sig Dispense Refill  . Ascorbic Acid (VITAMIN C) 1000 MG tablet Take 1,000 mg by mouth daily.      . Cholecalciferol (VITAMIN D) 2000 UNITS tablet Take 2,000 Units by mouth daily.      . COCONUT OIL PO Take 2 tablets by mouth daily.    Marland Kitchen HYDROcodone-acetaminophen (NORCO/VICODIN) 5-325 MG per tablet Take 1 tablet by mouth every 6 (six) hours as needed for moderate pain. 40 tablet 0  . hydrocortisone-pramoxine (ANALPRAM-HC) 2.5-1 % rectal cream Place 1 application rectally 3 (three) times daily. 30 g 1  . Multiple Vitamin (MULTIVITAMIN) capsule Take 1 capsule by mouth daily.      . Omega-3 Fatty Acids (FISH OIL) 1000 MG CAPS Take by mouth 2 (two) times daily.     Marland Kitchen PRILOSEC OTC 20 MG tablet TAKE ONE TABLET BY MOUTH ONCE DAILY. 42 tablet 3  . Probiotic Product (PROBIOTIC DAILY PO) Take 1 tablet by mouth daily.    Marland Kitchen UNABLE TO FIND daily. Fruit, Veg Vit    . UNABLE TO FIND 1 capsule. Med Name: magnesium     No current facility-administered medications for this visit.    Allergies as of 02/01/2015 - Review Complete 02/01/2015  Allergen Reaction Noted  . Penicillins  Family History  Problem Relation Age of Onset  . Colon cancer Paternal Grandfather   . Heart attack Mother   . Diabetes Father   . Heart attack Father   . Alzheimer's disease Father     Social History   Social History  . Marital Status: Married    Spouse Name: N/A  . Number of Children: N/A  . Years of Education: N/A   Occupational History  . [employer]   Social History Main Topics  . Smoking status: Never Smoker   . Smokeless tobacco: Never Used  . Alcohol Use: No  . Drug Use: No  . Sexual Activity: Yes    Birth Control/ Protection:  Post-menopausal, Other-see comments     Comment: vasectomy   Other Topics Concern  . None   Social History Narrative    Review of Systems: General: Negative for anorexia, weight loss, fever, chills, fatigue, weakness. ENT: Negative for hoarseness, difficulty swallowing. CV: Negative for chest pain, angina, palpitations, peripheral edema.  Respiratory: Negative for dyspnea at rest, cough, sputum, wheezing.  GI: See history of present illness. Endo: Negative for unusual weight change.    Physical Exam: BP 125/72 mmHg  Pulse 71  Temp(Src) 97.9 F (36.6 C)  Ht 5\' 3"  (1.6 m)  Wt 171 lb 6.4 oz (77.747 kg)  BMI 30.37 kg/m2 General:   Alert and oriented. Pleasant and cooperative. Well-nourished and well-developed.  Cardiovascular:  S1, S2 present without murmurs appreciated. Normal pulses noted. Extremities without clubbing or edema. Respiratory:  Clear to auscultation bilaterally. No wheezes, rales, or rhonchi. No distress.  Gastrointestinal:  +BS, soft, non-tender and non-distended. No HSM noted. No guarding or rebound. No masses appreciated.  Rectal:  Deferred  Neurologic:  Alert and oriented x4;  grossly normal neurologically. Psych:  Alert and cooperative. Normal mood and affect.    02/01/2015 8:24 AM

## 2015-02-12 NOTE — Interval H&P Note (Signed)
History and Physical Interval Note:  02/12/2015 2:20 PM  Ruth Warner  has presented today for surgery, with the diagnosis of GERD  The various methods of treatment have been discussed with the patient and family. After consideration of risks, benefits and other options for treatment, the patient has consented to  Procedure(s) with comments: ESOPHAGOGASTRODUODENOSCOPY (EGD) (N/A) - 200 as a surgical intervention .  The patient's history has been reviewed, patient examined, no change in status, stable for surgery.  I have reviewed the patient's chart and labs.  Questions were answered to the patient's satisfaction.     Judianne Warner  No change. Patient denies dysphagia. Diagnostic EGD per plan. The risks, benefits, limitations, alternatives and imponderables have been reviewed with the patient. Potential for esophageal dilation, biopsy, etc. have also been reviewed.  Questions have been answered. All parties agreeable.

## 2015-02-12 NOTE — Discharge Instructions (Signed)
EGD Discharge instructions Please read the instructions outlined below and refer to this sheet in the next few weeks. These discharge instructions provide you with general information on caring for yourself after you leave the hospital. Your doctor may also give you specific instructions. While your treatment has been planned according to the most current medical practices available, unavoidable complications occasionally occur. If you have any problems or questions after discharge, please call your doctor. ACTIVITY  You may resume your regular activity but move at a slower pace for the next 24 hours.   Take frequent rest periods for the next 24 hours.   Walking will help expel (get rid of) the air and reduce the bloated feeling in your abdomen.   No driving for 24 hours (because of the anesthesia (medicine) used during the test).   You may shower.   Do not sign any important legal documents or operate any machinery for 24 hours (because of the anesthesia used during the test).  NUTRITION  Drink plenty of fluids.   You may resume your normal diet.   Begin with a light meal and progress to your normal diet.   Avoid alcoholic beverages for 24 hours or as instructed by your caregiver.  MEDICATIONS  You may resume your normal medications unless your caregiver tells you otherwise.  WHAT YOU CAN EXPECT TODAY  You may experience abdominal discomfort such as a feeling of fullness or gas pains.  FOLLOW-UP  Your doctor will discuss the results of your test with you.  SEEK IMMEDIATE MEDICAL ATTENTION IF ANY OF THE FOLLOWING OCCUR:  Excessive nausea (feeling sick to your stomach) and/or vomiting.   Severe abdominal pain and distention (swelling).   Trouble swallowing.   Temperature over 101 F (37.8 C).   Rectal bleeding or vomiting of blood.    GERD information provided  For now, stop omeprazole. Go by my office for a 3 week supply of Dexilant 60 mg capsules. Take 1 daily.  Let me know in 3 weeks how this medication is working for you.  Office visit with [place] in 6-8 weeks      Gastroesophageal Reflux Disease, Adult Normally, food travels down the esophagus and stays in the stomach to be digested. However, when a person has gastroesophageal reflux disease (GERD), food and stomach acid move back up into the esophagus. When this happens, the esophagus becomes sore and inflamed. Over time, GERD can create small holes (ulcers) in the lining of the esophagus.  CAUSES This condition is caused by a problem with the muscle between the esophagus and the stomach (lower esophageal sphincter, or LES). Normally, the LES muscle closes after food passes through the esophagus to the stomach. When the LES is weakened or abnormal, it does not close properly, and that allows food and stomach acid to go back up into the esophagus. The LES can be weakened by certain dietary substances, medicines, and medical conditions, including:  Tobacco use.  Pregnancy.  Having a hiatal hernia.  Heavy alcohol use.  Certain foods and beverages, such as coffee, chocolate, onions, and peppermint. RISK FACTORS This condition is more likely to develop in:  People who have an increased body weight.  People who have connective tissue disorders.  People who use NSAID medicines. SYMPTOMS Symptoms of this condition include:  Heartburn.  Difficult or painful swallowing.  The feeling of having a lump in the throat.  Abitter taste in the mouth.  Bad breath.  Having a large amount of saliva.  Having  an upset or bloated stomach.  Belching.  Chest pain.  Shortness of breath or wheezing.  Ongoing (chronic) cough or a night-time cough.  Wearing away of tooth enamel.  Weight loss. Different conditions can cause chest pain. Make sure to see your health care provider if you experience chest pain. DIAGNOSIS Your health care provider will take a medical history and perform a physical  exam. To determine if you have mild or severe GERD, your health care provider may also monitor how you respond to treatment. You may also have other tests, including:  An endoscopy toexamine your stomach and esophagus with a small camera.  A test thatmeasures the acidity level in your esophagus.  A test thatmeasures how much pressure is on your esophagus.  A barium swallow or modified barium swallow to show the shape, size, and functioning of your esophagus. TREATMENT The goal of treatment is to help relieve your symptoms and to prevent complications. Treatment for this condition may vary depending on how severe your symptoms are. Your health care provider may recommend:  Changes to your diet.  Medicine.  Surgery. HOME CARE INSTRUCTIONS Diet  Follow a diet as recommended by your health care provider. This may involve avoiding foods and drinks such as:  Coffee and tea (with or without caffeine).  Drinks that containalcohol.  Energy drinks and sports drinks.  Carbonated drinks or sodas.  Chocolate and cocoa.  Peppermint and mint flavorings.  Garlic and onions.  Horseradish.  Spicy and acidic foods, including peppers, chili powder, curry powder, vinegar, hot sauces, and barbecue sauce.  Citrus fruit juices and citrus fruits, such as oranges, lemons, and limes.  Tomato-based foods, such as red sauce, chili, salsa, and pizza with red sauce.  Fried and fatty foods, such as donuts, french fries, potato chips, and high-fat dressings.  High-fat meats, such as hot dogs and fatty cuts of red and white meats, such as rib eye steak, sausage, ham, and bacon.  High-fat dairy items, such as whole milk, butter, and cream cheese.  Eat small, frequent meals instead of large meals.  Avoid drinking large amounts of liquid with your meals.  Avoid eating meals during the 2-3 hours before bedtime.  Avoid lying down right after you eat.  Do not exercise right after you  eat. General Instructions  Pay attention to any changes in your symptoms.  Take over-the-counter and prescription medicines only as told by your health care provider. Do not take aspirin, ibuprofen, or other NSAIDs unless your health care provider told you to do so.  Do not use any tobacco products, including cigarettes, chewing tobacco, and e-cigarettes. If you need help quitting, ask your health care provider.  Wear loose-fitting clothing. Do not wear anything tight around your waist that causes pressure on your abdomen.  Raise (elevate) the head of your bed 6 inches (15cm).  Try to reduce your stress, such as with yoga or meditation. If you need help reducing stress, ask your health care provider.  If you are overweight, reduce your weight to an amount that is healthy for you. Ask your health care provider for guidance about a safe weight loss goal.  Keep all follow-up visits as told by your health care provider. This is important. SEEK MEDICAL CARE IF:  You have new symptoms.  You have unexplained weight loss.  You have difficulty swallowing, or it hurts to swallow.  You have wheezing or a persistent cough.  Your symptoms do not improve with treatment.  You have a  hoarse voice. SEEK IMMEDIATE MEDICAL CARE IF:  You have pain in your arms, neck, jaw, teeth, or back.  You feel sweaty, dizzy, or light-headed.  You have chest pain or shortness of breath.  You vomit and your vomit looks like blood or coffee grounds.  You faint.  Your stool is bloody or black.  You cannot swallow, drink, or eat.   This information is not intended to replace advice given to you by your health care provider. Make sure you discuss any questions you have with your health care provider.   Document Released: 12/18/2004 Document Revised: 11/29/2014 Document Reviewed: 07/05/2014 Elsevier Interactive Patient Education [employer].

## 2015-02-12 NOTE — Op Note (Signed)
[facility] [address] [place], [address]   ENDOSCOPY PROCEDURE REPORT  PATIENT: Ruth, Warner  MR#: [id number] BIRTHDATE: 08-24-1955 , 89  yrs. old GENDER: female ENDOSCOPIST: R.  Garfield Cornea, MD FACP FACG REFERRED BY:  Kerin Perna, M.D. PROCEDURE DATE:  03/01/15 PROCEDURE:  EGD, diagnostic INDICATIONS:  Breakthrough GERD symptoms on omeprazole. MEDICATIONS: Versed 3 mg IV and Demerol 75 mg IV in divided doses. Xylocaine gel orally.  Zofran 4 mg IV. ASA CLASS:      Class II  CONSENT: The risks, benefits, limitations, alternatives and imponderables have been discussed.  The potential for biopsy, esophogeal dilation, etc. have also been reviewed.  Questions have been answered.  All parties agreeable.  Please see the history and physical in the medical record for more information.  DESCRIPTION OF PROCEDURE: After the risks benefits and alternatives of the procedure were thoroughly explained, informed consent was obtained.  The [id number] endoscope was introduced through the mouth and advanced to the second portion of the duodenum , limited by Without limitations. The instrument was slowly withdrawn as the mucosa was fully examined. Estimated blood loss is zero unless otherwise noted in this procedure report.    Entirely normal-appearing, tubular esophagus.  EG junction slightly patulous.  Stomach emptying.  Gastric mucosa appeared normal.  No significant hiatal hernia.  Patent pylorus appeared Normal-appearing first and second portion of the duodenum. Retroflexed views revealed as previously described.     The scope was then withdrawn from the patient and the procedure completed.  COMPLICATIONS: There were no immediate complications.  ENDOSCOPIC IMPRESSION: Normal EGD (slightly patulous EG junction). Recent breakthrough reflux symptoms without esophagitis seen on today's examination.  RECOMMENDATIONS: For now, stop omeprazole.  Trial  of Dexilant 60 mg daily?"patient is to go by my office for 3 week supply of samples. If symptoms not much better in 3 weeks, would consider further evaluation. Office visit with [place] in 6-8 weeks.  REPEAT EXAM:  eSigned:  R. Garfield Cornea, MD Rosalita Chessman [facility] Mar 01, 2015 2:52 PM    CC:  CPT CODES: ICD CODES:  The ICD and CPT codes recommended by this software are interpretations from the data that the clinical staff has captured with the software.  The verification of the translation of this report to the ICD and CPT codes and modifiers is the sole responsibility of the health care institution and practicing physician where this report was generated.  [employer]. will not be held responsible for the validity of the ICD and CPT codes included on this report.  AMA assumes no liability for data contained or not contained herein. CPT is a Designer, television/film set of the [employer].  PATIENT NAME:  Ruth, Warner MR#: [id number]

## 2015-02-12 NOTE — Telephone Encounter (Signed)
Samples are at the front desk. RMR called earlier.

## 2015-02-20 ENCOUNTER — Encounter (HOSPITAL_COMMUNITY): Payer: Self-pay | Admitting: Internal Medicine

## 2015-04-19 ENCOUNTER — Emergency Department (HOSPITAL_COMMUNITY)
Admission: EM | Admit: 2015-04-19 | Discharge: 2015-04-19 | Disposition: A | Discharge status: Home / Self Care | Payer: BC Managed Care – PPO | Attending: Emergency Medicine | Admitting: Emergency Medicine

## 2015-04-19 ENCOUNTER — Emergency Department (HOSPITAL_COMMUNITY): Payer: BC Managed Care – PPO

## 2015-04-19 ENCOUNTER — Encounter (HOSPITAL_COMMUNITY): Payer: Self-pay | Admitting: *Deleted

## 2015-04-19 DIAGNOSIS — K219 Gastro-esophageal reflux disease without esophagitis: Secondary | ICD-10-CM | POA: Insufficient documentation

## 2015-04-19 DIAGNOSIS — R002 Palpitations: Secondary | ICD-10-CM | POA: Diagnosis not present

## 2015-04-19 DIAGNOSIS — Z7982 Long term (current) use of aspirin: Secondary | ICD-10-CM | POA: Insufficient documentation

## 2015-04-19 DIAGNOSIS — G8929 Other chronic pain: Secondary | ICD-10-CM | POA: Insufficient documentation

## 2015-04-19 DIAGNOSIS — Z7952 Long term (current) use of systemic steroids: Secondary | ICD-10-CM | POA: Insufficient documentation

## 2015-04-19 DIAGNOSIS — J3489 Other specified disorders of nose and nasal sinuses: Secondary | ICD-10-CM | POA: Insufficient documentation

## 2015-04-19 DIAGNOSIS — Z87448 Personal history of other diseases of urinary system: Secondary | ICD-10-CM | POA: Diagnosis not present

## 2015-04-19 DIAGNOSIS — Z87828 Personal history of other (healed) physical injury and trauma: Secondary | ICD-10-CM | POA: Diagnosis not present

## 2015-04-19 DIAGNOSIS — Z79899 Other long term (current) drug therapy: Secondary | ICD-10-CM | POA: Insufficient documentation

## 2015-04-19 DIAGNOSIS — Z880 Allergy status to penicillin: Secondary | ICD-10-CM | POA: Diagnosis not present

## 2015-04-19 DIAGNOSIS — R000 Tachycardia, unspecified: Secondary | ICD-10-CM | POA: Diagnosis not present

## 2015-04-19 DIAGNOSIS — Z792 Long term (current) use of antibiotics: Secondary | ICD-10-CM | POA: Diagnosis not present

## 2015-04-19 HISTORY — DX: Palpitations: R00.2

## 2015-04-19 HISTORY — DX: Other chronic pain: G89.29

## 2015-04-19 HISTORY — DX: Reserved for inherently not codable concepts without codable children: IMO0001

## 2015-04-19 HISTORY — DX: Epigastric pain: R10.13

## 2015-04-19 LAB — BASIC METABOLIC PANEL
Anion gap: 10 (ref 5–15)
BUN: 13 mg/dL (ref 6–20)
CO2: 30 mmol/L (ref 22–32)
Calcium: 9.9 mg/dL (ref 8.9–10.3)
Chloride: 102 mmol/L (ref 101–111)
Creatinine, Ser: 0.73 mg/dL (ref 0.44–1.00)
GFR calc Af Amer: >60 mL/min (ref >60)
GFR calc non Af Amer: >60 mL/min (ref >60)
Glucose, Bld: 134 mg/dL — ABNORMAL HIGH (ref 65–99)
Potassium: 3.6 mmol/L (ref 3.5–5.1)
Sodium: 142 mmol/L (ref 135–145)

## 2015-04-19 LAB — CBC
HCT: 42.5 % (ref 36.0–46.0)
Hemoglobin: 14.1 g/dL (ref 12.0–15.0)
MCH: 29.9 pg (ref 26.0–34.0)
MCHC: 33.2 g/dL (ref 30.0–36.0)
MCV: 90.2 fL (ref 78.0–100.0)
Platelets: 222 10*3/uL (ref 150–400)
RBC: 4.71 MIL/uL (ref 3.87–5.11)
RDW: 12.9 % (ref 11.5–15.5)
WBC: 10.6 10*3/uL — ABNORMAL HIGH (ref 4.0–10.5)

## 2015-04-19 LAB — TROPONIN I
Troponin I: <0.03 ng/mL (ref <0.031)
Troponin I: <0.03 ng/mL (ref <0.031)

## 2015-04-19 LAB — D-DIMER, QUANTITATIVE: D-Dimer, Quant: <0.27 ug/mL-FEU (ref 0.00–0.50)

## 2015-04-19 NOTE — ED Notes (Signed)
Pt was sitting at desk when she began to have a fluttering feeling in her chest, pt states her heart rate was 120-130's. Pt presents with a burning sensation between breasts in triage. NAD noted.

## 2015-04-19 NOTE — ED Provider Notes (Signed)
CSN: [id number]     Arrival date & time 04/19/15  1559 History   First MD Initiated Contact with Patient 04/19/15 1645     Chief Complaint  Patient presents with  . Tachycardia      HPI Pt was seen at 1655. Per pt, c/o gradual onset and slow improvement of one episode of "palpitations" that began approximately 1500 PTA. Pt states she was sitting at work when she felt her "heart start to beat fast." This was associated with "my entire body feeling heavy." Pt went to her MD office for evaluation. Pt states she was told her HR was "122 and regular." States she was told her "SBP was high" and she needed to come to the ED for evaluation. Pt states her symptoms have been gradually improving since seeing her MD and coming to the ED. Pt's only recent new medication was finishing a course of levaquin and Robitussin DM prn, to tx URI symptoms. Denies CP/SOB, no cough, no back pain, no abd pain, no N/V/D, no syncope/near syncope, no visual changes, no focal motor weakness, no tingling/numbness in extremities, no ataxia, no slurred speech, no facial droop.      Past Medical History  Diagnosis Date  . GERD (gastroesophageal reflux disease)   . IBS (irritable bowel syndrome)   . Concussion     staples, hit a shelf  . S/P colonoscopy 2001    Dr. Gala Romney: normal. Repeat 10 years  . S/P endoscopy 2001    Dr. Gala Romney: normal  . Chronic headaches   . Pelvic relaxation 04/12/2013  . Rectocele 04/12/2013  . Palpitations   . Abdominal pain, chronic, epigastric   . Normal cardiac stress test 07/2014   Past Surgical History  Procedure Laterality Date  . Tonsillectomy    . Colonoscopy  07/30/2010    Rourk: Single diminutive hyperplastic polyp in the mid descending colon status post cold biopsy removal  . Esophagogastroduodenoscopy endoscopy      With RMR, unable to remember date.  . Esophagogastroduodenoscopy N/A 02/12/2015    Procedure: ESOPHAGOGASTRODUODENOSCOPY (EGD);  Surgeon: Daneil Dolin, MD;   Location: [facility];  Service: Endoscopy;  Laterality: N/A;  200   Family History  Problem Relation Age of Onset  . Colon cancer Paternal Grandfather   . Heart attack Mother   . Diabetes Father   . Heart attack Father   . Alzheimer's disease Father    Social History  Substance Use Topics  . Smoking status: Never Smoker   . Smokeless tobacco: Never Used  . Alcohol Use: No   OB History    Gravida Para Term Preterm AB TAB SAB Ectopic Multiple Living   2 2        2      Review of Systems ROS: Statement: All systems negative except as marked or noted in the HPI; Constitutional: Negative for fever and chills. +"entire body felt heavy."; ; Eyes: Negative for eye pain, redness and discharge. ; ; ENMT: Negative for ear pain, hoarseness, nasal congestion, sinus pressure and sore throat. ; ; Cardiovascular: +palpitations. Negative for chest pain, diaphoresis, dyspnea and peripheral edema. ; ; Respiratory: Negative for cough, wheezing and stridor. ; ; Gastrointestinal: Negative for nausea, vomiting, diarrhea, abdominal pain, blood in stool, hematemesis, jaundice and rectal bleeding. . ; ; Genitourinary: Negative for dysuria, flank pain and hematuria. ; ; Musculoskeletal: Negative for back pain and neck pain. Negative for swelling and trauma.; ; Skin: Negative for pruritus, rash, abrasions, blisters, bruising and skin  lesion.; ; Neuro: Negative for headache, lightheadedness and neck stiffness. Negative for weakness, altered level of consciousness , altered mental status, extremity weakness, paresthesias, involuntary movement, seizure and syncope.      Allergies  Cephalexin and Penicillins  Home Medications   Prior to Admission medications   Medication Sig Start Date End Date Taking? Authorizing Provider  Ascorbic Acid (VITAMIN C) 1000 MG tablet Take 1,000 mg by mouth daily.     Yes Historical Provider, MD  aspirin EC 81 MG tablet Take 324 mg by mouth once as needed for mild pain.   Yes  Historical Provider, MD  Cholecalciferol (VITAMIN D) 2000 UNITS tablet Take 2,000 Units by mouth daily.     Yes Historical Provider, MD  COCONUT OIL PO Take 2 tablets by mouth daily.   Yes Historical Provider, MD  guaiFENesin-dextromethorphan (ROBITUSSIN DM) 100-10 MG/5ML syrup Take 5 mLs by mouth every 4 (four) hours as needed for cough.   Yes Historical Provider, MD  HYDROcodone-acetaminophen (NORCO/VICODIN) 5-325 MG per tablet Take 1 tablet by mouth every 6 (six) hours as needed for moderate pain. 11/29/14  Yes Estill Dooms, NP  hydrocortisone-pramoxine [facility]) 2.5-1 % rectal cream Place 1 application rectally 3 (three) times daily. 09/19/14  Yes Estill Dooms, NP  Multiple Vitamin (MULTIVITAMIN) capsule Take 1 capsule by mouth daily.     Yes Historical Provider, MD  Omega-3 Fatty Acids (FISH OIL TRIPLE STRENGTH) 1400 MG CAPS Take 1 capsule by mouth every morning.   Yes Historical Provider, MD  omeprazole (PRILOSEC) 20 MG capsule Take 20 mg by mouth every morning.   Yes Historical Provider, MD  Probiotic Product (DIGESTIVE ADVANTAGE) CAPS Take 1 capsule by mouth daily.   Yes Historical Provider, MD  Propylene Glycol (SYSTANE BALANCE) 0.6 % SOLN Apply 1 drop to eye 2 (two) times daily.   Yes Historical Provider, MD  vitamin E 400 UNIT capsule Take 400 Units by mouth daily.   Yes Historical Provider, MD  levofloxacin (LEVAQUIN) 500 MG tablet Take 500 mg by mouth daily. Reported on 04/19/2015 04/09/15   Historical Provider, MD   BP 168/70 mmHg  Pulse 103  Temp(Src) 97.8 F (36.6 C) (Oral)  Resp 16  Ht 5\' 3"  (1.6 m)  Wt 177 lb (80.287 kg)  BMI 31.36 kg/m2  SpO2 99%   Patient Vitals for the past 24 hrs:  BP Temp Temp src Pulse Resp SpO2 Height Weight  04/19/15 1805 144/68 mmHg - - 92 17 97 % - -  04/19/15 1800 - - - 91 15 98 % - -  04/19/15 1745 - - - 93 14 98 % - -  04/19/15 1730 - - - 87 16 99 % - -  04/19/15 1700 168/70 mmHg - - 103 16 99 % - -  04/19/15 1612 121/79 mmHg  97.8 F (36.6 C) Oral 120 20 100 % 5\' 3"  (1.6 m) 177 lb (80.287 kg)     Physical Exam  1700: Physical examination:  Nursing notes reviewed; Vital signs and O2 SAT reviewed;  Constitutional: Well developed, Well nourished, Well hydrated, In no acute distress; Head:  Normocephalic, atraumatic; Eyes: EOMI, PERRL, No scleral icterus; ENMT: TM's clear bilat. +edemetous nasal turbinates bilat with clear rhinorrhea. Mouth and pharynx without lesions. No tonsillar exudates. No intra-oral edema. No submandibular or sublingual edema. No hoarse voice, no drooling, no stridor. No trismus. Mouth and pharynx normal, Mucous membranes moist; Neck: Supple, Full range of motion, No lymphadenopathy; Cardiovascular: Tachycardic rate and rhythm, No  gallop; Respiratory: Breath sounds clear & equal bilaterally, No wheezes.  Speaking full sentences with ease, Normal respiratory effort/excursion; Chest: Nontender, Movement normal; Abdomen: Soft, Nontender, Nondistended, Normal bowel sounds; Genitourinary: No CVA tenderness; Extremities: Pulses normal, No tenderness, No edema, No calf edema or asymmetry.; Neuro: AA&Ox3, Major CN grossly intact. No facial droop. Speech clear. No gross focal motor or sensory deficits in extremities. Climbs on and off stretcher easily by herself. Gait steady.; Skin: Color normal, Warm, Dry.   ED Course  Procedures (including critical care time) Labs Review  Imaging Review  I have personally reviewed and evaluated these images and lab results as part of my medical decision-making.   EKG Interpretation   Date/Time:  Thursday April 19 2015 16:09:10 EST Ventricular Rate:  121 PR Interval:  132 QRS Duration: 76 QT Interval:  318 QTC Calculation: 451 R Axis:   72 Text Interpretation:  Sinus tachycardia Otherwise normal ECG When compared  with ECG of 05/03/2007 Rate faster Otherwise no significant change Confirmed  by [facility]  MD, Nunzio Cory [id number]) on 04/19/2015 5:24:07 PM      MDM   MDM Reviewed: previous chart, nursing note and vitals Reviewed previous: labs and ECG Interpretation: labs, ECG and x-ray      Results for orders placed or performed during the hospital encounter of [id number]  Basic metabolic panel  Result Value Ref Range   Sodium 142 135 - 145 mmol/L   Potassium 3.6 3.5 - 5.1 mmol/L   Chloride 102 101 - 111 mmol/L   CO2 30 22 - 32 mmol/L   Glucose, Bld 134 (H) 65 - 99 mg/dL   BUN 13 6 - 20 mg/dL   Creatinine, Ser 0.73 0.44 - 1.00 mg/dL   Calcium 9.9 8.9 - 10.3 mg/dL   GFR calc non Af Amer >60 >60 mL/min   GFR calc Af Amer >60 >60 mL/min   Anion gap 10 5 - 15  CBC  Result Value Ref Range   WBC 10.6 (H) 4.0 - 10.5 K/uL   RBC 4.71 3.87 - 5.11 MIL/uL   Hemoglobin 14.1 12.0 - 15.0 g/dL   HCT 42.5 36.0 - 46.0 %   MCV 90.2 78.0 - 100.0 fL   MCH 29.9 26.0 - 34.0 pg   MCHC 33.2 30.0 - 36.0 g/dL   RDW 12.9 11.5 - 15.5 %   Platelets 222 150 - 400 K/uL  Troponin I  Result Value Ref Range   Troponin I <0.03 <0.031 ng/mL  D-dimer, quantitative  Result Value Ref Range   D-Dimer, Quant <0.27 0.00 - 0.50 ug/mL-FEU  Troponin I  Result Value Ref Range   Troponin I <0.03 <0.031 ng/mL   Dg Chest 2 View 04/19/2015  CLINICAL DATA:  Increased blood pressure with anterior chest burning and heavy sensation in legs today. Nonsmoker. EXAM: CHEST  2 VIEW COMPARISON:  04/30/2007 radiographs. FINDINGS: The heart size and mediastinal contours are stable. Ill-defined density adjacent to the left heart border is unchanged, probably due to a combination of epicardial fat and basilar scarring. The lungs are otherwise clear. There is no pleural effusion or pneumothorax. The bones appear unremarkable. IMPRESSION: Stable chest.  No acute cardiopulmonary process. Electronically Signed   By: Richardean Sale M.D.   On: 04/19/2015 16:30    1925:  Pt continues to deny CP. Neuro exam remains intact and unchanged. Workup reassuring. HR has improved spontaneously. Pt states she  feels better and wants to go home now. Will need f/u Cards MD. Dx and  testing d/w pt and family.  Questions answered.  Verb understanding, agreeable to d/c home with outpt f/u.   Francine Graven, DO 04/22/15 1008

## 2015-04-19 NOTE — ED Notes (Signed)
Pt verbalizes she is feeling much better than she did.    Pt took 324 ASA before coming to the ED

## 2015-04-19 NOTE — Discharge Instructions (Signed)
°Emergency Department Resource Guide °1) Find a Doctor and Pay Out of Pocket °Although you won't have to find out who is covered by your insurance plan, it is a good idea to ask around and get recommendations. You will then need to call the office and see if the doctor you have chosen will accept you as a new patient and what types of options they offer for patients who are self-pay. Some doctors offer discounts or will set up payment plans for their patients who do not have insurance, but you will need to ask so you aren't surprised when you get to your appointment. ° °2) Contact Your Local Health Department °Not all health departments have doctors that can see patients for sick visits, but many do, so it is worth a call to see if yours does. If you don't know where your local health department is, you can check in your phone book. The CDC also has a tool to help you locate your state's health department, and many state websites also have listings of all of their local health departments. ° °3) Find a Walk-in Clinic °If your illness is not likely to be very severe or complicated, you may want to try a walk in clinic. These are popping up all over the country in pharmacies, drugstores, and shopping centers. They're usually staffed by nurse practitioners or physician assistants that have been trained to treat common illnesses and complaints. They're usually fairly quick and inexpensive. However, if you have serious medical issues or chronic medical problems, these are probably not your best option. ° °No Primary Care Doctor: °- Call [facility] at  832-8000 - they can help you locate a primary care doctor that  accepts your insurance, provides certain services, etc. °- Physician Referral Service- 1-800-533-3463 ° °Chronic Pain Problems: °Organization         Address  Phone   Notes  °[facility]  (336) 297-2271 Patients need to be referred by their primary care doctor.  ° °Medication  Assistance: °Organization         Address  Phone   Notes  °[place] [facility] [address] °[place], [place] [address] (336) 641-8030 --Must be a resident of [place] °-- Must have NO insurance coverage whatsoever (no Medicaid/ Medicare, etc.) °-- The pt. MUST have a primary care doctor that directs their care regularly and follows them in the community °  °[facility]  (866) 331-1348   °[employer]  (888) 892-1162   ° °Agencies that provide inexpensive medical care: °Organization         Address  Phone   Notes  °[facility]  (336) 832-8035   °[facility]    (336) 832-7272   °[facility] [address] °[place], [place] [address] (336) 832-4777   °[facility] [address], °[place] (336) 271-4999   °[facility]    (336) 373-0678   °[facility]    (336) 272-1050   °[facility] ° [address], [place] Phone:  (336) 832-4444, Fax:  (336) 832-4440 Hours of Operation:  9 am - 6 pm, M-F.  Also accepts Medicaid/Medicare and self-pay.  °[facility] ° [address], [place] Phone: (336) 832-3150, Fax: (336) 832-3151. Hours of Operation:  8:30 am - 5:30 pm, M-F.  Also accepts Medicaid and self-pay.  °[facility] [place] [address], [place] Phone: (336) 878-6027   °[facility] [address], [place], [place] (336)723-1848, Ext. 123 Mondays & Thursdays: 7-9 AM.  First 15 patients are seen on a first come, first serve basis. °  ° °Medicaid-accepting [place] Providers: ° °Organization         Address  Phone   Notes  °[facility] [address], [place] (336) 641-2100 Also accepts self-pay patients.  °[facility] [address], [place] ° (336) 856-9996   °[facility] [address], [place]  (336) 288-8857   °[facility] [address] [place] [address], [place] (336) 299-7000   °Veita Bland [address], [place]  ° (336) 373-1557 Only accepts [place] Access Medicaid patients after they have their name applied to their card.  ° °Self-Pay (no insurance) in [place]: ° °Organization         Address  Phone   Notes  °Sickle Cell Patients, [facility] [address], [place] (336) 832-1970   °[facility] [address], [place] (336) 832-4400   °[facility] [facility] ° [address], [place] (336) 992-4800   °[facility]/Dr. Osei-Bonsu ° [address] [place] [address], [place] or [address], [place] (336) [phone] Phone number for both [place] and [place] locations is the same.  °[facility] [address], [place] (336) 299-0000   °[facility] [address] [place] [address], [place] or [address] (336) 852-7530 °(336) 878-2260   °[facility] [address], [place] (336) 350-1642, phone; (336) 294-5005, fax Sees patients 1st and 3rd Saturday of every month.  Must not qualify for public or private insurance (i.e. Medicaid, Medicare, [place] Health Choice, Veterans' Benefits) • Household income should be no more than 200% of the poverty level •The clinic cannot treat you if you are pregnant or think you are pregnant • Sexually transmitted diseases are not treated at the clinic.  ° ° °Dental Care: °Organization         Address  Phone  Notes  °[place] [facility] [address], [place] (336) 641-6152 Accepts children up to age 21 who are enrolled in Medicaid or [place] Health Choice; pregnant women with a Medicaid card; and children who have applied for Medicaid or [place] Health Choice, but were declined, whose parents can pay a reduced fee at time of service.  °[place]  [facility] [place]  [address], [place] (336) 641-7733 Accepts children up to age 21 who are enrolled in Medicaid or [place] Health Choice; pregnant women with a Medicaid card; and children who have applied for Medicaid or [place] Health Choice, but were declined, whose parents can pay a reduced fee at time of service.  °[facility] ° [address],  (336) 641-4533 Patients are seen by appointment only. Walk-ins are not accepted. [facility] will see patients 18 years of age and older. °Monday - Tuesday (8am-5pm) °Most Wednesdays (8:30-5pm) °$30 per visit, cash only  °[facility] ° [address], [place] (336) 641-4533 Patients are seen by appointment only. Walk-ins are not accepted. [facility] will see patients 18 years of age and older. °One   Wednesday Evening (Monthly: Volunteer Based).  $30 per visit, cash only  °[facility]  (919) 537-3737 for adults; Children under age 4, call Graduate Pediatric Dentistry at (919) [phone]. Children aged 4-14, please call (919) [phone] to request a pediatric application. ° Dental services are provided in all areas of dental care including fillings, crowns and bridges, complete and partial dentures, implants, gum treatment, root canals, and extractions. Preventive care is also provided. Treatment is provided to both adults and children. °Patients are selected via a lottery and there is often a waiting list. °  °[facility] [address], °[place] ° (336) 763-8833 www.drcivils.com °  °[facility] [address], [place], [place] (336)723-1848, Ext. 123 Second and Fourth Thursday of each month, opens at 6:30 AM; Clinic ends at 9 AM.  Patients are seen on a first-come first-served basis, and a limited number are seen during each clinic.  ° °[facility] ° [address], [place], [place] (336) 723-7904    Eligibility Requirements °You must have lived in [place], [place], or [place] counties for at least the last three months. °  You cannot be eligible for state or federal sponsored healthcare insurance, including Veterans Administration, Medicaid, or Medicare. °  You generally cannot be eligible for healthcare insurance through your employer.  °  How to apply: °Eligibility screenings are held every Tuesday and Wednesday afternoon from 1:00 pm until 4:00 pm. You do not need an appointment for the interview!  °[facility] [address], [place], [place] 336-631-2330   °[facility]  336-342-8273   °[place]  336-703-3100   °[facility]  336-570-6415   ° °Behavioral Health Resources in the Community: °Intensive Outpatient Programs °Organization         Address  Phone  Notes  °[facility] [address], [place], [place] 336-878-6098   °[facility] [address], [place], [place] 336-832-9800   °[facility] [address], [place], [place] ° 336-882-2125   °[facility] [address],  °[place], [place] 1-800-853-5163 or 336-641-4981   °Substance Abuse Resources °Organization         Address  Phone  Notes  °[facility]  336-882-2125   °[facility]  336-784-9470   °[facility]  336-285-9073   °[facility]  336-845-3988   °Residential & Outpatient Substance Abuse Program  1-800-659-3381   °Psychological Services °Organization         Address  Phone  Notes  °[facility]  336- 832-9600   °[facility]  336- 378-7881   °[facility] [address], [place] 1-800-853-5163 or 336-641-4981   ° °Mobile Crisis Teams °Organization         Address  Phone  Notes  °[facility], Mobile Crisis Care Unit  1-877-626-1772   °Assertive °[facility] ° [address]  [place], [place] 336-834-9664   °Sharon DeEsch [address] ° [place] 336-554-5454   ° °Self-Help/Support Groups °Organization         Address  Phone             Notes  °[facility]  - variety of support groups  336- 373-1402 Call for more information  °Narcotics Anonymous (NA), [facility] [address], °[place]   2 meetings at this location  ° °  Residential Treatment Programs Organization         Address  Phone  Notes  [facility] [address]    [place]  1-203-557-1406   [facility]  [address] [place] [id number], [place], [place]   [place], [place] 3375619998 Admissions: 8am-3pm M-F  [facility]cent[facility] Substance [place] [address],    [place], [place] X4321937   [facility] [address] [place], [place], [place]   [facility] [address]  [place], [place]   [facility] Intensive Outpatient [address], [place], [place]   [facility] [address]  [place], [place] 1-431 556 6630 or (541) 715-9918   [facility] [address] [place], [place] Accepts Medicaid  [facility] [address],  [place] 1-713-564-9373 Substance Abuse/Addiction Treatment   [facility] Organization         Address  Phone  Notes  [facility]  (779)581-2848   Domenic Schwab, PhD [address] [place], [place]   7797291880 or 418-641-3288   [facility] [place], [place] 831-688-8821   [facility] [address] [place], [place] (916) 438-8794 Insurance/Medicaid/sponsorship through [facility] and Families [address] [place]                                    [place], [place] (530)417-3989 [place] [address] [place] [address], [place] 360-522-8680    Dr. Adele Schilder  (808)[phone]   [facility] Dept. 1) [address] [place] 2) [place] 3)  [address], [place] (402)[phone]  (210)[phone]   [place] 7651487924 or 815-551-7649 (After Hours)      Avoid avoid caffinated products, such as teas, colas, coffee, chocolate. Avoid over the counter cold medicines, herbal or "natural vitamin" products, and illicit drugs because they can contain stimulants. You may need a Holter monitor or an event monitor placed for further evaluation. Call your regular medical doctor and the Cardiologist tomorrow to schedule a follow up appointment in the next 2 to 3 days.  Return to the Emergency Department immediately if worsening.

## 2015-05-03 ENCOUNTER — Telehealth: Payer: Self-pay | Admitting: Adult Health

## 2015-05-03 MED ORDER — HYDROCODONE-ACETAMINOPHEN 5-325 MG PO TABS: 1.0000 | ORAL_TABLET | Freq: Four times a day (QID) | ORAL | Status: DC | PRN

## 2015-05-03 NOTE — Telephone Encounter (Signed)
Will refill norco needs appt

## 2015-05-07 ENCOUNTER — Ambulatory Visit (INDEPENDENT_AMBULATORY_CARE_PROVIDER_SITE_OTHER): Payer: BC Managed Care – PPO | Admitting: Nurse Practitioner

## 2015-05-07 ENCOUNTER — Encounter (INDEPENDENT_AMBULATORY_CARE_PROVIDER_SITE_OTHER): Payer: Self-pay

## 2015-05-07 ENCOUNTER — Encounter: Payer: Self-pay | Admitting: Internal Medicine

## 2015-05-07 ENCOUNTER — Encounter: Payer: Self-pay | Admitting: Nurse Practitioner

## 2015-05-07 VITALS — BP 128/78 | HR 76 | Temp 98.3°F | Ht 63.0 in | Wt 172.8 lb

## 2015-05-07 DIAGNOSIS — K219 Gastro-esophageal reflux disease without esophagitis: Secondary | ICD-10-CM

## 2015-05-07 DIAGNOSIS — G8929 Other chronic pain: Secondary | ICD-10-CM | POA: Diagnosis not present

## 2015-05-07 DIAGNOSIS — R1013 Epigastric pain: Secondary | ICD-10-CM

## 2015-05-07 MED ORDER — OMEPRAZOLE 20 MG PO CPDR: 20.0000 mg | DELAYED_RELEASE_CAPSULE | Freq: Two times a day (BID) | ORAL | Status: DC

## 2015-05-07 NOTE — Assessment & Plan Note (Signed)
Cannot tolerate Dexilant due to side effects. She is taking omeprazole 20 mg twice a day and this is working well for her. No epigastric pain. No tenderness to palpation on exam. Continue omeprazole twice a day, return for follow-up in 6 months.

## 2015-05-07 NOTE — Progress Notes (Signed)
cc'ed to pcp °

## 2015-05-07 NOTE — Assessment & Plan Note (Signed)
Did not tolerate Dexilant due to side effects. She is back on omeprazole twice a day and this is working well for her. No breakthrough GERD symptoms. Continue omeprazole 20 mg twice a day. Return for follow-up in 6 months.

## 2015-05-07 NOTE — Patient Instructions (Signed)
1. I sent in a prescription for omeprazole 20 mg delayed release capsule to your pharmacy to take twice a day. 2. Return for follow-up in 6 months or as needed for worsening or recurrent symptoms.

## 2015-05-07 NOTE — Progress Notes (Signed)
Referring Provider: Redmond School, MD Primary Care Physician:  Glo Herring., MD Primary GI:  Dr. Gala Romney  Chief Complaint  Patient presents with  . Follow-up    HPI:   60 year old female presents for follow-up on GERD and, epigastric pain. Last seen in our office 02/01/2015 with increasing GERD symptoms despite omeprazole twice a day. Negative cardiac workup. She is referred for endoscopic evaluation at that time. EGD completed 02/12/2015 which found normal EGD, recent breakthrough symptoms without esophagitis. Recommended stop omeprazole, trial Dexilant 60 mg daily.  Today she states she didn't like Dexilant. It helped, but she started "feeling bad and swollen." She went back to [place] and her other symptoms resolved within 2 days. She is doing well on the omeprazole currently, no breakthrough symptoms since. Taking it twice daily as she had been. Denies epigastric pain, esophageal burning, other breakthrough symptoms. Denies hematochezia and melena. Has been under a lot of stress with the recent passing of her father and has had some upset stomach and variable bowel movements related to that, but this "tends to happen to me." Denies chest pain, dyspnea, dizziness, lightheadedness, syncope, near syncope. Denies any other upper or lower GI symptoms.  Past Medical History  Diagnosis Date  . GERD (gastroesophageal reflux disease)   . IBS (irritable bowel syndrome)   . Concussion     staples, hit a shelf  . S/P colonoscopy 2001    Dr. Gala Romney: normal. Repeat 10 years  . S/P endoscopy 2001    Dr. Gala Romney: normal  . Chronic headaches   . Pelvic relaxation 04/12/2013  . Rectocele 04/12/2013  . Palpitations   . Abdominal pain, chronic, epigastric   . Normal cardiac stress test 07/2014    Past Surgical History  Procedure Laterality Date  . Tonsillectomy    . Colonoscopy  07/30/2010    [place]: Single diminutive hyperplastic polyp in the mid descending colon status post cold biopsy  removal  . Esophagogastroduodenoscopy endoscopy      With [facility], unable to remember date.  . Esophagogastroduodenoscopy N/A 02/12/2015    [id number] EGD    Current Outpatient Prescriptions  Medication Sig Dispense Refill  . Ascorbic Acid (VITAMIN C) 1000 MG tablet Take 1,000 mg by mouth daily.      Marland Kitchen aspirin EC 81 MG tablet Take 324 mg by mouth once as needed for mild pain.    . Cholecalciferol (VITAMIN D) 2000 UNITS tablet Take 2,000 Units by mouth daily.      . COCONUT OIL PO Take 2 tablets by mouth daily.    Marland Kitchen Dexlansoprazole (DEXILANT PO) Take by mouth.    . hydrocortisone-pramoxine (ANALPRAM-HC) 2.5-1 % rectal cream Place 1 application rectally 3 (three) times daily. 30 g 1  . Multiple Vitamin (MULTIVITAMIN) capsule Take 1 capsule by mouth daily.      . Omega-3 Fatty Acids (FISH OIL TRIPLE STRENGTH) 1400 MG CAPS Take 1 capsule by mouth every morning.    . Probiotic Product (DIGESTIVE ADVANTAGE) CAPS Take 1 capsule by mouth daily.    Marland Kitchen Propylene Glycol (SYSTANE BALANCE) 0.6 % SOLN Apply 1 drop to eye 2 (two) times daily.    . vitamin E 400 UNIT capsule Take 400 Units by mouth daily.    Marland Kitchen guaiFENesin-dextromethorphan (ROBITUSSIN DM) 100-10 MG/5ML syrup Take 5 mLs by mouth every 4 (four) hours as needed for cough. Reported on 05/07/2015    . HYDROcodone-acetaminophen (NORCO/VICODIN) 5-325 MG tablet Take 1 tablet by mouth every 6 (six) hours as needed for  moderate pain. (Patient not taking: Reported on 05/07/2015) 40 tablet 0  . levofloxacin (LEVAQUIN) 500 MG tablet Take 500 mg by mouth daily. Reported on 05/07/2015    . omeprazole (PRILOSEC) 20 MG capsule Take 20 mg by mouth every morning. Reported on 05/07/2015     No current facility-administered medications for this visit.    Allergies as of 05/07/2015 - Review Complete 05/07/2015  Allergen Reaction Noted  . Cephalexin Hives 04/19/2015  . Penicillins      Family History  Problem Relation Age of Onset  . Colon cancer Paternal  Grandfather   . Heart attack Mother   . Diabetes Father   . Heart attack Father   . Alzheimer's disease Father     Social History   Social History  . Marital Status: Married    Spouse Name: N/A  . Number of Children: N/A  . Years of Education: N/A   Occupational History  . [employer]   Social History Main Topics  . Smoking status: Never Smoker   . Smokeless tobacco: Never Used  . Alcohol Use: No  . Drug Use: No  . Sexual Activity: Yes    Birth Control/ Protection: Post-menopausal, Other-see comments     Comment: vasectomy   Other Topics Concern  . None   Social History Narrative    Review of Systems: General: Negative for anorexia, weight loss, fever, chills, fatigue, weakness. ENT: Negative for hoarseness, difficulty swallowing. CV: Negative for chest pain, angina, palpitations, peripheral edema.  Respiratory: Negative for dyspnea at rest, cough, sputum, wheezing.  GI: See history of present illness. Endo: Negative for unusual weight change.    Physical Exam: BP 128/78 mmHg  Pulse 76  Temp(Src) 98.3 F (36.8 C)  Ht 5\' 3"  (1.6 m)  Wt 172 lb 13.5 oz (78.4 kg)  BMI 30.63 kg/m2 General:   Alert and oriented. Pleasant and cooperative. Well-nourished and well-developed.  Head:  Normocephalic and atraumatic. Eyes:  Without icterus, sclera clear and conjunctiva pink.  Ears:  Normal auditory acuity. Cardiovascular:  S1, S2 present without murmurs appreciated. Extremities without clubbing or edema. Respiratory:  Clear to auscultation bilaterally. No wheezes, rales, or rhonchi. No distress.  Gastrointestinal:  +BS, soft, non-tender and non-distended. No guarding or rebound. Rectal:  Deferred  Neurologic:  Alert and oriented x4;  grossly normal neurologically. Psych:  Alert and cooperative. Normal mood and affect.    05/07/2015 8:17 AM

## 2015-05-08 ENCOUNTER — Ambulatory Visit (INDEPENDENT_AMBULATORY_CARE_PROVIDER_SITE_OTHER): Payer: BC Managed Care – PPO | Admitting: Cardiology

## 2015-05-08 ENCOUNTER — Encounter: Payer: Self-pay | Admitting: Cardiology

## 2015-05-08 VITALS — BP 118/68 | HR 77 | Ht 63.0 in | Wt 174.0 lb

## 2015-05-08 DIAGNOSIS — R002 Palpitations: Secondary | ICD-10-CM

## 2015-05-08 DIAGNOSIS — R030 Elevated blood-pressure reading, without diagnosis of hypertension: Secondary | ICD-10-CM | POA: Diagnosis not present

## 2015-05-08 DIAGNOSIS — R011 Cardiac murmur, unspecified: Secondary | ICD-10-CM | POA: Diagnosis not present

## 2015-05-08 DIAGNOSIS — Z136 Encounter for screening for cardiovascular disorders: Secondary | ICD-10-CM | POA: Diagnosis not present

## 2015-05-08 NOTE — Patient Instructions (Signed)
Medication Instructions:  Your physician recommends that you continue on your current medications as directed. Please refer to the Current Medication list given to you today.   Labwork: NONE  Testing/Procedures: Your physician has requested that you have an echocardiogram. Echocardiography is a painless test that uses sound waves to create images of your heart. It provides your doctor with information about the size and shape of your heart and how well your heart's chambers and valves are working. This procedure takes approximately one hour. There are no restrictions for this procedure.  Your physician has recommended that you wear an event monitor. Event monitors are medical devices that record the heart's electrical activity. Doctors most often [place] these monitors to diagnose arrhythmias. Arrhythmias are problems with the speed or rhythm of the heartbeat. The monitor is a small, portable device. You can wear one while you do your normal daily activities. This is usually used to diagnose what is causing palpitations/syncope (passing out).    Follow-Up: Your physician recommends that you schedule a follow-up appointment in: 1 MONTH    Any Other Special Instructions Will Be Listed Below (If Applicable).  YOUR EVENT MONITOR WILL BE MAILED TO YOU , IF YOU HAVE ANY QUESTIONS AFTER READING THE INSTRUCTIONS- FEEL FREE TO CALL us    If you need a refill on your cardiac medications before your next appointment, please call your pharmacy.

## 2015-05-08 NOTE — Progress Notes (Signed)
Cardiology Office Note  Date: 05/08/2015   ID: Ruth Warner, DOB 1955-10-28, MRN [id number]  PCP: Glo Herring., MD  Referring provider: Dr. Francine Graven Consulting Cardiologist: Rozann Lesches, MD   Chief Complaint  Patient presents with  . History of palpitations    History of Present Illness: Ruth Warner is a 60 y.o. female referred for cardiology consultation by Dr. Francine Graven after recent ER visit in January with palpitations. She describes the episode that led to her evaluation. 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 She was at work, actually at the time watering some plants. She suddenly became aware that her heart rate was fast, occurred after she sat down at her desk. She felt very weak and heavy all over, went to the bathroom and had to "push" herself to get there. She did not have any chest pain or syncope. She was seen at her gynecologist office where reportedly heart rate was elevated and her systolic was also "[id number]." She was then sent to the ER. With time her heart rate came back down into the 90s, I reviewed her ECG has shown below. Cardiac markers were normal.  Record review finds previous cardiac evaluation by Dr. Ron Parker in 2009. She reportedly had a normal exercise Myoview study in February 2009. I see that she was also evaluated by Dr. Hamilton Capri with the [facility] practice in May 2016 at which time she had a normal treadmill test.  She has a history of intermittent palpitations over the years, never any documented arrhythmia. She has never worn a cardiac monitor.  I reviewed her medications which are outlined below. She reports stable regimen. She was on a limited course of Levaquin for a sinus infection back in January. Otherwise no recent changes.  She has started exercising at the gym over the last few weeks. She walks on a treadmill, has worked up to exercising for 40 minutes, starts off at 1 mile per hour and increases to a peak of 3 miles  per hour. She has had none of the above symptoms during her recent exercise regimen.   Past Medical History  Diagnosis Date  . GERD (gastroesophageal reflux disease)   . IBS (irritable bowel syndrome)   . History of concussion     Staples, hit a shelf  . S/P colonoscopy 2001    Dr. Gala Romney: normal. Repeat 10 years  . S/P endoscopy 2001    Dr. Gala Romney: normal  . Chronic headaches   . Pelvic relaxation 2015  . Rectocele 2015  . Palpitations   . Abdominal pain, chronic, epigastric   . Normal cardiac stress test     February 2009 and May 2016 ([facility])    Past Surgical History  Procedure Laterality Date  . Tonsillectomy    . Colonoscopy  07/30/2010    Rourk: Single diminutive hyperplastic polyp in the mid descending colon status post cold biopsy removal  . Esophagogastroduodenoscopy endoscopy      With RMR, unable to remember date.  . Esophagogastroduodenoscopy N/A 02/12/2015    [id number] EGD    Current Outpatient Prescriptions  Medication Sig Dispense Refill  . Multiple Vitamin (MULTIVITAMIN) capsule Take 1 capsule by mouth daily.      . Omega-3 Fatty Acids (FISH OIL TRIPLE STRENGTH) 1400 MG CAPS Take 1 capsule by mouth every morning.    Marland Kitchen omeprazole (PRILOSEC) 20 MG capsule Take 1 capsule (20 mg total) by mouth 2 (two) times daily before a meal. 60 capsule 5  . Propylene  Glycol (SYSTANE BALANCE) 0.6 % SOLN Apply 1 drop to eye 2 (two) times daily. Reported on 05/08/2015    . Ascorbic Acid (VITAMIN C) 1000 MG tablet Take 1,000 mg by mouth daily. Reported on 05/08/2015    . aspirin EC 81 MG tablet Take 324 mg by mouth once as needed for mild pain. Reported on 05/08/2015    . Cholecalciferol (VITAMIN D) 2000 UNITS tablet Take 2,000 Units by mouth daily. Reported on 05/08/2015    . COCONUT OIL PO Take 2 tablets by mouth daily. Reported on 05/08/2015    . hydrocortisone-pramoxine [facility]) 2.5-1 % rectal cream Place 1 application rectally 3 (three) times daily. (Patient not taking:  Reported on 05/08/2015) 30 g 1  . Probiotic Product (DIGESTIVE ADVANTAGE) CAPS Take 1 capsule by mouth daily. Reported on 05/08/2015    . vitamin E 400 UNIT capsule Take 400 Units by mouth daily. Reported on 05/08/2015     No current facility-administered medications for this visit.   Allergies:  Cephalexin and Penicillins   Social History: The patient  reports that she has never smoked. She has never used smokeless tobacco. She reports that she does not drink alcohol or use illicit drugs.   Family History: The patient's family history includes Alzheimer's disease in her father; Colon cancer in her paternal grandfather; Diabetes in her father; Heart attack in her father and mother.   ROS:  Please see the history of present illness. Otherwise, complete review of systems is positive for reported sinus infection back in January.  All other systems are reviewed and negative.   Physical Exam: VS:  BP 118/68 mmHg  Pulse 77  Ht 5\' 3"  (1.6 m)  Wt 174 lb (78.926 kg)  BMI 30.83 kg/m2  SpO2 99%, BMI Body mass index is 30.83 kg/(m^2).  Wt Readings from Last 3 Encounters:  05/08/15 174 lb (78.926 kg)  05/07/15 172 lb 13.5 oz (78.4 kg)  04/19/15 177 lb (80.287 kg)    General: Overweight woman, appears comfortable at rest. HEENT: Conjunctiva and lids normal, oropharynx clear with moist mucosa. Neck: Supple, no elevated JVP or carotid bruits, no thyromegaly. Lungs: Clear to auscultation, nonlabored breathing at rest. Cardiac: Regular rate and rhythm, no S3, 2/6 systolic murmur, no pericardial rub. Abdomen: Soft, nontender,bowel sounds present, no guarding or rebound. Extremities: No pitting edema, distal pulses 2+. Skin: Warm and dry. Musculoskeletal: No kyphosis. Neuropsychiatric: Alert and oriented x3, affect grossly appropriate.  ECG: I personally reviewed the recent tracing from 04/19/2015 which showed sinus tachycardia 121 bpm, otherwise normal.  Recent Labwork: 04/19/2015: BUN 13;  Creatinine, Ser 0.73; Hemoglobin 14.1; Platelets 222; Potassium 3.6; Sodium 142   Other Studies Reviewed Today:  Chest x-ray 04/19/2015: FINDINGS: The heart size and mediastinal contours are stable. Ill-defined density adjacent to the left heart border is unchanged, probably due to a combination of epicardial fat and basilar scarring. The lungs are otherwise clear. There is no pleural effusion or pneumothorax. The bones appear unremarkable.  IMPRESSION: Stable chest. No acute cardiopulmonary process.  Assessment and Plan:  1. History of palpitations and documented sinus tachycardia in January. Question whether she has had any transient arrhythmia such as PSVT leading to her symptoms which has not yet been caught by the time that monitoring or ECG available. We will plan a 30 day event recorder since she does not report very frequent symptoms, generally no more than once in a month. Office follow-up arranged to review.  2. Cardiac murmur. In light of her above  symptoms including palpitations and transient episodes of fatigue, an echocardiogram will be obtained to assess cardiac structure and function.  3. History of normal GXT in May 2016. Patient was evaluated by Dr. Hamilton Capri with the [facility] practice at that time. She does not report any recurring chest pain at this time.  4. Recently documented elevation in blood pressure. She does not have any standing history of essential hypertension and her blood pressure is normal today. Recommended that she keep follow-up with PCP.  Current medicines were reviewed with the patient today.   Orders Placed This Encounter  Procedures  . Cardiac event monitor  . Echocardiogram    Disposition: FU with me in 1 month.   Signed, Satira Sark, MD, [facility] 05/08/2015 8:45 AM    [facility] at [facility] [address], [place], [place] [address] Phone: [phone]; Fax: [phone]

## 2015-05-09 ENCOUNTER — Ambulatory Visit (INDEPENDENT_AMBULATORY_CARE_PROVIDER_SITE_OTHER): Payer: BC Managed Care – PPO

## 2015-05-09 DIAGNOSIS — R002 Palpitations: Secondary | ICD-10-CM

## 2015-05-11 ENCOUNTER — Ambulatory Visit (HOSPITAL_COMMUNITY)
Admission: RE | Admit: 2015-05-11 | Discharge: 2015-05-11 | Disposition: A | Discharge status: Home / Self Care | Payer: BC Managed Care – PPO | Source: Ambulatory Visit | Attending: Cardiology | Admitting: Cardiology

## 2015-05-11 DIAGNOSIS — K219 Gastro-esophageal reflux disease without esophagitis: Secondary | ICD-10-CM | POA: Insufficient documentation

## 2015-05-11 DIAGNOSIS — R011 Cardiac murmur, unspecified: Secondary | ICD-10-CM

## 2015-05-11 DIAGNOSIS — N959 Unspecified menopausal and perimenopausal disorder: Secondary | ICD-10-CM | POA: Insufficient documentation

## 2015-05-11 DIAGNOSIS — I371 Nonrheumatic pulmonary valve insufficiency: Secondary | ICD-10-CM | POA: Insufficient documentation

## 2015-06-06 ENCOUNTER — Encounter: Payer: Self-pay | Admitting: Cardiology

## 2015-06-06 ENCOUNTER — Ambulatory Visit (INDEPENDENT_AMBULATORY_CARE_PROVIDER_SITE_OTHER): Payer: BC Managed Care – PPO | Admitting: Cardiology

## 2015-06-06 VITALS — BP 126/78 | HR 75 | Ht 63.0 in | Wt 177.0 lb

## 2015-06-06 DIAGNOSIS — R002 Palpitations: Secondary | ICD-10-CM | POA: Diagnosis not present

## 2015-06-06 DIAGNOSIS — I471 Supraventricular tachycardia, unspecified: Secondary | ICD-10-CM

## 2015-06-06 NOTE — Patient Instructions (Signed)
Medication Instructions:  Your physician recommends that you continue on your current medications as directed. Please refer to the Current Medication list given to you today.   Labwork: NONE  Testing/Procedures: NONE  Follow-Up: Your physician wants you to follow-up in: 6 MONTHS .  You will receive a reminder letter in the mail two months in advance. If you don't receive a letter, please call our office to schedule the follow-up appointment.   Any Other Special Instructions Will Be Listed Below (If Applicable).  Thanks for choosing [facility]!!!      If you need a refill on your cardiac medications before your next appointment, please call your pharmacy.

## 2015-06-06 NOTE — Progress Notes (Signed)
Cardiology Office Note  Date: 06/06/2015   ID: Ruth Warner, DOB November 19, 1955, MRN [id number]  PCP: Glo Herring., MD  Primary Cardiologist: Rozann Lesches, MD   Chief Complaint  Patient presents with  . Follow-up palpitations    History of Present Illness: Ruth Warner is a 60 y.o. female seen in consultation in February for palpitations. She was referred for a 30 day cardiac monitor,to be turned in after today - I reviewed the available strips. Sinus rhythm is present with some sinus tachycardia. Brief atrial runs lasting 3-10 beats noted with heart rate between 100-150 bpm. I reviewed the strips with her. She states that she has actually felt fairly well over the last month, only an occasional flutter. She has had no dizziness or syncope.  She has had previous cardiac evaluation by Dr. Ron Parker in 2009. She reportedly had a normal exercise Myoview study in February 2009. She was also evaluated by Dr. Hamilton Capri with the [facility] practice in May 2016 at which time she had a normal treadmill test. She is not reporting any recurrent exertional chest pain.  Past Medical History  Diagnosis Date  . GERD (gastroesophageal reflux disease)   . IBS (irritable bowel syndrome)   . History of concussion     Staples, hit a shelf  . S/P colonoscopy 2001    Dr. Gala Romney: normal. Repeat 10 years  . S/P endoscopy 2001    Dr. Gala Romney: normal  . Chronic headaches   . Pelvic relaxation 2015  . Rectocele 2015  . Palpitations   . Abdominal pain, chronic, epigastric   . Normal cardiac stress test     February 2009 and May 2016 ([facility])    Current Outpatient Prescriptions  Medication Sig Dispense Refill  . Ascorbic Acid (VITAMIN C) 1000 MG tablet Take 1,000 mg by mouth daily. Reported on 05/08/2015    . aspirin EC 81 MG tablet Take 324 mg by mouth once as needed for mild pain. Reported on 05/08/2015    . Cholecalciferol (VITAMIN D) 2000 UNITS tablet Take 2,000 Units by mouth daily. Reported on  05/08/2015    . COCONUT OIL PO Take 2 tablets by mouth daily. Reported on 05/08/2015    . hydrocortisone-pramoxine [facility]) 2.5-1 % rectal cream Place 1 application rectally 3 (three) times daily. 30 g 1  . Multiple Vitamin (MULTIVITAMIN) capsule Take 1 capsule by mouth daily.      . Omega-3 Fatty Acids (FISH OIL TRIPLE STRENGTH) 1400 MG CAPS Take 1 capsule by mouth every morning.    Marland Kitchen omeprazole (PRILOSEC) 20 MG capsule Take 1 capsule (20 mg total) by mouth 2 (two) times daily before a meal. 60 capsule 5  . Probiotic Product (DIGESTIVE ADVANTAGE) CAPS Take 1 capsule by mouth daily. Reported on 05/08/2015    . Propylene Glycol (SYSTANE BALANCE) 0.6 % SOLN Apply 1 drop to eye 2 (two) times daily. Reported on 05/08/2015    . vitamin E 400 UNIT capsule Take 400 Units by mouth daily. Reported on 05/08/2015     No current facility-administered medications for this visit.   Allergies:  Cephalexin and Penicillins   Social History: The patient  reports that she has never smoked. She has never used smokeless tobacco. She reports that she does not drink alcohol or use illicit drugs.   ROS:  Please see the history of present illness. Otherwise, complete review of systems is positive for none.  All other systems are reviewed and negative.   Physical Exam: VS:  BP 126/78  mmHg  Pulse 75  Ht 5\' 3"  (1.6 m)  Wt 177 lb (80.287 kg)  BMI 31.36 kg/m2  SpO2 96%, BMI Body mass index is 31.36 kg/(m^2).  Wt Readings from Last 3 Encounters:  06/06/15 177 lb (80.287 kg)  05/08/15 174 lb (78.926 kg)  05/07/15 172 lb 13.5 oz (78.4 kg)    General: Overweight woman, appears comfortable at rest. HEENT: Conjunctiva and lids normal, oropharynx clear with moist mucosa. Neck: Supple, no elevated JVP or carotid bruits, no thyromegaly. Lungs: Clear to auscultation, nonlabored breathing at rest. Cardiac: Regular rate and rhythm, no S3, 2/6 systolic murmur, no pericardial rub. Abdomen: Soft, nontender,bowel sounds  present, no guarding or rebound. Extremities: No pitting edema, distal pulses 2+.  ECG: I personally reviewed the prior tracing from 04/19/2015 which showed sinus tachycardia.  Recent Labwork: 04/19/2015: BUN 13; Creatinine, Ser 0.73; Hemoglobin 14.1; Platelets 222; Potassium 3.6; Sodium 142   Other Studies Reviewed Today:  Echocardiogram 05/11/2015: Study Conclusions  - Left ventricle: The cavity size was normal. Wall thickness was  normal. Systolic function was vigorous. The estimated ejection  fraction was in the range of 65% to 70%. Wall motion was normal;  there were no regional wall motion abnormalities. - Aortic valve: Valve area (VTI): 2 cm^2. Valve area (Vmax): 2.18  cm^2. - Technically adequate study.  Chest x-ray 04/19/2015: FINDINGS: The heart size and mediastinal contours are stable. Ill-defined density adjacent to the left heart border is unchanged, probably due to a combination of epicardial fat and basilar scarring. The lungs are otherwise clear. There is no pleural effusion or pneumothorax. The bones appear unremarkable.  IMPRESSION: Stable chest. No acute cardiopulmonary process.  Assessment and Plan:  1. History of palpitations, relatively quiescent over the last month during heart monitoring. She has had only occasional flutters. Brief episodes of SVT noted, I wonder whether she had a more prolonged episode with more pronounced symptoms back in April 16, 2022. We discussed observation at this point, I gave her the option of at least having an as needed beta blocker available, but she did not want to take any new medications as yet.  2. Benign cardiac murmur, echocardiogram noted above.  Current medicines were reviewed with the patient today.  Disposition: FU with me in 6 months.   Signed, Satira Sark, MD, [facility] 06/06/2015 8:28 AM    [facility] [address], [place], [place] [address] Phone: [phone];  Fax: [phone]

## 2015-06-12 ENCOUNTER — Ambulatory Visit (INDEPENDENT_AMBULATORY_CARE_PROVIDER_SITE_OTHER): Payer: BC Managed Care – PPO | Admitting: Adult Health

## 2015-06-12 ENCOUNTER — Encounter: Payer: Self-pay | Admitting: Adult Health

## 2015-06-12 VITALS — BP 122/72 | HR 72 | Ht 63.25 in | Wt 173.5 lb

## 2015-06-12 DIAGNOSIS — N816 Rectocele: Secondary | ICD-10-CM

## 2015-06-12 DIAGNOSIS — Z1212 Encounter for screening for malignant neoplasm of rectum: Secondary | ICD-10-CM | POA: Diagnosis not present

## 2015-06-12 DIAGNOSIS — G8929 Other chronic pain: Secondary | ICD-10-CM

## 2015-06-12 DIAGNOSIS — Z01411 Encounter for gynecological examination (general) (routine) with abnormal findings: Secondary | ICD-10-CM | POA: Diagnosis not present

## 2015-06-12 DIAGNOSIS — R51 Headache: Secondary | ICD-10-CM

## 2015-06-12 DIAGNOSIS — Z01419 Encounter for gynecological examination (general) (routine) without abnormal findings: Secondary | ICD-10-CM

## 2015-06-12 DIAGNOSIS — N8189 Other female genital prolapse: Secondary | ICD-10-CM

## 2015-06-12 LAB — HEMOCCULT GUIAC POC 1CARD (OFFICE): Fecal Occult Blood, POC: NEGATIVE

## 2015-06-12 MED ORDER — HYDROCODONE-ACETAMINOPHEN 5-325 MG PO TABS: 1.0000 | ORAL_TABLET | Freq: Four times a day (QID) | ORAL | Status: DC | PRN

## 2015-06-12 NOTE — Patient Instructions (Signed)
Mammogram yearly Pap and physical in 1 year

## 2015-06-12 NOTE — Progress Notes (Signed)
Patient ID: Ruth Warner, female   DOB: [birth year], 60 y.o.   MRN: [id number] History of Present Illness: Ruth Warner is a 60 year old white female, married in for a well woman gyn exam, she had normal pap with negative HPV 04/12/13.   Current Medications, Allergies, Past Medical History, Past Surgical History, Family History and Social History were reviewed in [employer] record.     Review of Systems:  Patient denies any daily headaches, hearing loss, fatigue, blurred vision, shortness of breath, chest pain, abdominal pain, problems with bowel movements, urination(trickles at times), or intercourse. No joint pain or mood swings.   Physical Exam:BP 122/72 mmHg  Pulse 72  Ht 5' 3.25" (1.607 m)  Wt 173 lb 8 oz (78.699 kg)  BMI 30.47 kg/m2 General:  Well developed, well nourished, no acute distress Skin:  Warm and dry Neck:  Midline trachea, normal thyroid, good ROM, no lymphadenopathy Lungs; Clear to auscultation bilaterally Breast:  No dominant palpable mass, retraction, or nipple discharge Cardiovascular: Regular rate and rhythm Abdomen:  Soft, non tender, no hepatosplenomegaly Pelvic:  External genitalia is normal in appearance, no lesions.  The vagina is normal in appearance. Urethra has no lesions or masses. The cervix is bulbous, and smooth, has some relaxation.  Uterus is felt to be normal size, shape, and contour.  No adnexal masses or tenderness noted.Bladder is non tender, no masses felt. Rectal: Good sphincter tone, no polyps, or hemorrhoids felt.  Hemoccult negative.+rectocele Extremities/musculoskeletal:  No swelling or varicosities noted, no clubbing or cyanosis Psych:  No mood changes, alert and cooperative,seems happy Discussed when voids to stand and void again to see if helps, the trickle.  Impression: Well woman gyn exam no pap Pelvic relaxation Rectocele Headaches     Plan: Check CBC,CMP,TSH and lipids,A1c and vitamin D Mammogram  yearly Pap and physical in 1 year Colonoscopy per GI Refilled norco 5-325 mg #40 take 1 every 6 hours prn headache, no refills

## 2015-06-13 ENCOUNTER — Telehealth: Payer: Self-pay | Admitting: Adult Health

## 2015-06-13 LAB — COMPREHENSIVE METABOLIC PANEL
ALT: 27 IU/L (ref 0–32)
AST: 19 IU/L (ref 0–40)
Albumin/Globulin Ratio: 2.1 (ref 1.2–2.2)
Albumin: 4.9 g/dL — ABNORMAL HIGH (ref 3.6–4.8)
Alkaline Phosphatase: 83 IU/L (ref 39–117)
BUN/Creatinine Ratio: 18 (ref 11–26)
BUN: 13 mg/dL (ref 8–27)
Bilirubin Total: 0.4 mg/dL (ref 0.0–1.2)
CO2: 25 mmol/L (ref 18–29)
Calcium: 10.2 mg/dL (ref 8.7–10.3)
Chloride: 103 mmol/L (ref 96–106)
Creatinine, Ser: 0.72 mg/dL (ref 0.57–1.00)
GFR calc Af Amer: 105 mL/min/{1.73_m2} (ref >59)
GFR calc non Af Amer: 91 mL/min/{1.73_m2} (ref >59)
Globulin, Total: 2.3 g/dL (ref 1.5–4.5)
Glucose: 101 mg/dL — ABNORMAL HIGH (ref 65–99)
Potassium: 5.7 mmol/L — ABNORMAL HIGH (ref 3.5–5.2)
Sodium: 146 mmol/L — ABNORMAL HIGH (ref 134–144)
Total Protein: 7.2 g/dL (ref 6.0–8.5)

## 2015-06-13 LAB — HEMOGLOBIN A1C
Est. average glucose Bld gHb Est-mCnc: 120 mg/dL
Hgb A1c MFr Bld: 5.8 % — ABNORMAL HIGH (ref 4.8–5.6)

## 2015-06-13 LAB — CBC
Hematocrit: 45.8 % (ref 34.0–46.6)
Hemoglobin: 15 g/dL (ref 11.1–15.9)
MCH: 30.4 pg (ref 26.6–33.0)
MCHC: 32.8 g/dL (ref 31.5–35.7)
MCV: 93 fL (ref 79–97)
Platelets: 216 10*3/uL (ref 150–379)
RBC: 4.94 x10E6/uL (ref 3.77–5.28)
RDW: 13.8 % (ref 12.3–15.4)
WBC: 4.3 10*3/uL (ref 3.4–10.8)

## 2015-06-13 LAB — LIPID PANEL
Chol/HDL Ratio: 3.3 ratio units (ref 0.0–4.4)
Cholesterol, Total: 203 mg/dL — ABNORMAL HIGH (ref 100–199)
HDL: 62 mg/dL (ref >39)
LDL Calculated: 122 mg/dL — ABNORMAL HIGH (ref 0–99)
Triglycerides: 95 mg/dL (ref 0–149)
VLDL Cholesterol Cal: 19 mg/dL (ref 5–40)

## 2015-06-13 LAB — VITAMIN D 25 HYDROXY (VIT D DEFICIENCY, FRACTURES): Vit D, 25-Hydroxy: 58.3 ng/mL (ref 30.0–100.0)

## 2015-06-13 LAB — TSH: TSH: 1.95 u[IU]/mL (ref 0.450–4.500)

## 2015-06-13 NOTE — Telephone Encounter (Signed)
Pt aware labs were good, but increase water, Na and K+ just a bit elevated

## 2015-07-02 ENCOUNTER — Other Ambulatory Visit: Payer: Self-pay | Admitting: Adult Health

## 2015-07-02 MED ORDER — NYSTATIN-TRIAMCINOLONE 100000-0.1 UNIT/GM-% EX CREA: 1.0000 "application " | TOPICAL_CREAM | Freq: Two times a day (BID) | CUTANEOUS | Status: DC

## 2015-11-05 ENCOUNTER — Encounter: Payer: Self-pay | Admitting: Nurse Practitioner

## 2015-11-05 ENCOUNTER — Ambulatory Visit (INDEPENDENT_AMBULATORY_CARE_PROVIDER_SITE_OTHER): Payer: BC Managed Care – PPO | Admitting: Nurse Practitioner

## 2015-11-05 VITALS — BP 121/75 | HR 80 | Temp 99.0°F | Ht 63.0 in | Wt 174.4 lb

## 2015-11-05 DIAGNOSIS — K219 Gastro-esophageal reflux disease without esophagitis: Secondary | ICD-10-CM

## 2015-11-05 DIAGNOSIS — G8929 Other chronic pain: Secondary | ICD-10-CM

## 2015-11-05 DIAGNOSIS — R1013 Epigastric pain: Secondary | ICD-10-CM | POA: Diagnosis not present

## 2015-11-05 NOTE — Assessment & Plan Note (Signed)
Essentially resolved, occasional/rare breakthrough symptoms if she misses a dose of Prilosec or if she eats something she shouldn't. Continue PPI, dietary instructions provided, return for follow-up as needed.

## 2015-11-05 NOTE — Assessment & Plan Note (Signed)
Symptoms resolved with adequate control of GERD. Continue PPI, return for follow-up as needed, call us with any problems. Next colonoscopy in 2022.

## 2015-11-05 NOTE — Progress Notes (Signed)
Referring Provider: Redmond School, MD Primary Care Physician:  Glo Herring., MD Primary GI:  Dr. Gala Romney  Chief Complaint  Patient presents with  . Follow-up    doing ok    HPI:   Ruth Warner is a 60 y.o. female who presents for follow-up on GERD. Last seen in our office 05/07/2015 at which point it was noted she cannot tolerate Dexilant due to side effects and was on omeprazole 20 mg twice daily which is working well for her. No breakthrough symptoms. Recommend follow-up in 6 months.  Today she states she his much improved. Rare breakthrough symptoms usually if she misses a dose of PPI. Abdominal pain resolved, GERD symptoms essentially resolved. Has rare/occasional one-day exacerbations of IBS which she is ok dealing with. Denies hematochezia, melena, fever, chills, unintentional weight loss. Denies chest pain, dyspnea, dizziness, lightheadedness, syncope, near syncope. Denies any other upper or lower GI symptoms.  Colonoscopy up to date, next due 2022.  Past Medical History:  Diagnosis Date  . Abdominal pain, chronic, epigastric   . Chronic headaches   . GERD (gastroesophageal reflux disease)   . History of concussion    Staples, hit a shelf  . IBS (irritable bowel syndrome)   . Normal cardiac stress test    February 2009 and May 2016 ([facility])  . Palpitations   . Pelvic relaxation 2015  . Rectocele 2015  . S/P colonoscopy 2001   Dr. Gala Romney: normal. Repeat 10 years  . S/P endoscopy 2001   Dr. Gala Romney: normal    Past Surgical History:  Procedure Laterality Date  . COLONOSCOPY  07/30/2010   Rourk: Single diminutive hyperplastic polyp in the mid descending colon status post cold biopsy removal  . ESOPHAGOGASTRODUODENOSCOPY N/A 02/12/2015   [id number] EGD  . ESOPHAGOGASTRODUODENOSCOPY ENDOSCOPY     With RMR, unable to remember date.  . TONSILLECTOMY      Current Outpatient Prescriptions  Medication Sig Dispense Refill  . Ascorbic Acid (VITAMIN C) 1000 MG  tablet Take 1,000 mg by mouth daily. Reported on 05/08/2015    . aspirin EC 81 MG tablet Take 324 mg by mouth once as needed for mild pain. Reported on 05/08/2015    . Cholecalciferol (VITAMIN D) 2000 UNITS tablet Take 2,000 Units by mouth daily. Reported on 05/08/2015    . HYDROcodone-acetaminophen (NORCO/VICODIN) 5-325 MG tablet Take 1 tablet by mouth every 6 (six) hours as needed. 40 tablet 0  . hydrocortisone-pramoxine (ANALPRAM-HC) 2.5-1 % rectal cream Place 1 application rectally 3 (three) times daily. (Patient taking differently: Place 1 application rectally as needed. ) 30 g 1  . Multiple Vitamin (MULTIVITAMIN) capsule Take 1 capsule by mouth daily.      . Omega-3 Fatty Acids (FISH OIL TRIPLE STRENGTH) 1400 MG CAPS Take 1 capsule by mouth every morning.    Marland Kitchen omeprazole (PRILOSEC) 20 MG capsule Take 1 capsule (20 mg total) by mouth 2 (two) times daily before a meal. 60 capsule 5  . Probiotic Product (DIGESTIVE ADVANTAGE) CAPS Take 1 capsule by mouth daily. Reported on 05/08/2015    . Propylene Glycol (SYSTANE BALANCE) 0.6 % SOLN Apply 1 drop to eye 2 (two) times daily. Reported on 05/08/2015    . TURMERIC PO Take by mouth daily.     No current facility-administered medications for this visit.     Allergies as of 11/05/2015 - Review Complete 11/05/2015  Allergen Reaction Noted  . Cephalexin Hives 04/19/2015  . Penicillins      Family History  Problem Relation Age of Onset  . Colon cancer Paternal Grandfather   . Heart attack Mother   . Diabetes Mother   . Diabetes Father   . Heart attack Father   . Alzheimer's disease Father     Social History   Social History  . Marital status: Married    Spouse name: N/A  . Number of children: N/A  . Years of education: N/A   Occupational History  . [employer]   Social History Main Topics  . Smoking status: Never Smoker  . Smokeless tobacco: Never Used  . Alcohol use No  .  Drug use: No  . Sexual activity: Yes    Birth control/ protection: Post-menopausal, Other-see comments     Comment: vasectomy   Other Topics Concern  . None   Social History Narrative  . None    Review of Systems: General: Negative for anorexia, weight loss, fever, chills, fatigue, weakness. ENT: Negative for hoarseness, difficulty swallowing. CV: Negative for chest pain, angina, palpitations, peripheral edema.  Respiratory: Negative for dyspnea at rest, sputum, wheezing.  GI: See history of present illness. Endo: Negative for unusual weight change.   Physical Exam: BP 121/75   Pulse 80   Temp 99 F (37.2 C) (Other (Comment))   Ht 5\' 3"  (1.6 m)   Wt 174 lb 6.4 oz (79.1 kg)   BMI 30.89 kg/m  General:   Alert and oriented. Pleasant and cooperative. Well-nourished and well-developed.  Eyes:  Without icterus, sclera clear and conjunctiva pink.  Ears:  Normal auditory acuity. Cardiovascular:  S1, S2 present without murmurs appreciated. Extremities without clubbing or edema. Respiratory:  Clear to auscultation bilaterally. No wheezes, rales, or rhonchi. No distress.  Gastrointestinal:  +BS, soft, non-tender and non-distended. No HSM noted. No guarding or rebound. No masses appreciated.  Rectal:  Deferred  Musculoskalatal:  Symmetrical without gross deformities. Neurologic:  Alert and oriented x4;  grossly normal neurologically. Psych:  Alert and cooperative. Normal mood and affect. Heme/Lymph/Immune: No excessive bruising noted.    11/05/2015 8:30 AM   Disclaimer: This note was dictated with voice recognition software. Similar sounding words can inadvertently be transcribed and may not be corrected upon review.

## 2015-11-05 NOTE — Patient Instructions (Addendum)
1. Taking your acid blocker twice a day. 2. Return for follow-up as needed. 3. I'm providing you with diet instructions below see you know which foods tend to worsen acid reflux symptoms. Try to avoid triggers of your GERD symptoms.   Food Choices for Gastroesophageal Reflux Disease, Adult When you have gastroesophageal reflux disease (GERD), the foods you eat and your eating habits are very important. Choosing the right foods can help ease the discomfort of GERD. WHAT GENERAL GUIDELINES DO I NEED TO FOLLOW?  Choose fruits, vegetables, whole grains, low-fat dairy products, and low-fat meat, fish, and poultry.  Limit fats such as oils, salad dressings, butter, nuts, and avocado.  Keep a food diary to identify foods that cause symptoms.  Avoid foods that cause reflux. These may be different for different people.  Eat frequent small meals instead of three large meals each day.  Eat your meals slowly, in a relaxed setting.  Limit fried foods.  Cook foods using methods other than frying.  Avoid drinking alcohol.  Avoid drinking large amounts of liquids with your meals.  Avoid bending over or lying down until 2-3 hours after eating. WHAT FOODS ARE NOT RECOMMENDED? The following are some foods and drinks that may worsen your symptoms: Vegetables Tomatoes. Tomato juice. Tomato and spaghetti sauce. Chili peppers. Onion and garlic. Horseradish. Fruits Oranges, grapefruit, and lemon (fruit and juice). Meats High-fat meats, fish, and poultry. This includes hot dogs, ribs, ham, sausage, salami, and bacon. Dairy Whole milk and chocolate milk. Sour cream. Cream. Butter. Ice cream. Cream cheese.  Beverages Coffee and tea, with or without caffeine. Carbonated beverages or energy drinks. Condiments Hot sauce. Barbecue sauce.  Sweets/Desserts Chocolate and cocoa. Donuts. Peppermint and spearmint. Fats and Oils High-fat foods, including [place] fries and potato chips. Other Vinegar.  Strong spices, such as black pepper, white pepper, red pepper, cayenne, curry powder, cloves, ginger, and chili powder. The items listed above may not be a complete list of foods and beverages to avoid. Contact your dietitian for more information.   This information is not intended to replace advice given to you by your health care provider. Make sure you discuss any questions you have with your health care provider.   Document Released: 03/10/2005 Document Revised: 03/31/2014 Document Reviewed: 01/12/2013 Elsevier Interactive Patient Education [employer].

## 2015-11-05 NOTE — Progress Notes (Signed)
cc'ed to pcp °

## 2015-12-27 ENCOUNTER — Other Ambulatory Visit: Payer: Self-pay | Admitting: Adult Health

## 2015-12-27 DIAGNOSIS — Z1231 Encounter for screening mammogram for malignant neoplasm of breast: Secondary | ICD-10-CM

## 2016-01-02 ENCOUNTER — Ambulatory Visit (HOSPITAL_COMMUNITY)
Admission: RE | Admit: 2016-01-02 | Discharge: 2016-01-02 | Disposition: A | Discharge status: Home / Self Care | Payer: BC Managed Care – PPO | Source: Ambulatory Visit | Attending: Adult Health | Admitting: Adult Health

## 2016-01-02 DIAGNOSIS — Z1231 Encounter for screening mammogram for malignant neoplasm of breast: Secondary | ICD-10-CM | POA: Diagnosis present

## 2016-01-21 NOTE — Progress Notes (Signed)
Cardiology Office Note  Date: 01/22/2016   ID: Ruth Warner, DOB 05-30-1955, MRN [id number]  PCP: Glo Herring., MD  Primary Cardiologist: Rozann Lesches, MD   Chief Complaint  Patient presents with  . Follow-up palpitations    History of Present Illness: Ruth Warner is a 60 y.o. female last seen in March. She presents for a routine follow-up visit. Since last encounter she reports only brief feelings of palpitations, nothing that has been frightening to her or associated with any other symptoms such as chest pain or breathlessness. She has had no syncope. She has had only brief episodes of SVT noted by cardiac monitoring.  She has had previous cardiac evaluation by Dr. Ron Parker in 2009. She reportedly had a normal exercise Myoview study in February 2009. She was also evaluated by Dr. Hamilton Capri with the [facility] practice in May 2016 at which time she had a normal treadmill test.  Past Medical History:  Diagnosis Date  . Abdominal pain, chronic, epigastric   . Chronic headaches   . GERD (gastroesophageal reflux disease)   . History of concussion    Staples, hit a shelf  . IBS (irritable bowel syndrome)   . Normal cardiac stress test    February 2009 and May 2016 ([facility])  . Palpitations   . Pelvic relaxation 2015  . Rectocele 2015  . S/P colonoscopy 2001   Dr. Gala Romney: normal. Repeat 10 years  . S/P endoscopy 2001   Dr. Gala Romney: normal    Current Outpatient Prescriptions  Medication Sig Dispense Refill  . Ascorbic Acid (VITAMIN C) 1000 MG tablet Take 1,000 mg by mouth daily. Reported on 05/08/2015    . aspirin EC 81 MG tablet Take 324 mg by mouth once as needed for mild pain. Reported on 05/08/2015    . Cholecalciferol (VITAMIN D) 2000 UNITS tablet Take 2,000 Units by mouth daily. Reported on 05/08/2015    . HYDROcodone-acetaminophen (NORCO/VICODIN) 5-325 MG tablet Take 1 tablet by mouth every 6 (six) hours as needed. 40 tablet 0  . hydrocortisone-pramoxine (ANALPRAM-HC)  2.5-1 % rectal cream Place 1 application rectally 3 (three) times daily. (Patient taking differently: Place 1 application rectally as needed. ) 30 g 1  . Multiple Vitamin (MULTIVITAMIN) capsule Take 1 capsule by mouth daily.      . Omega-3 Fatty Acids (FISH OIL TRIPLE STRENGTH) 1400 MG CAPS Take 1 capsule by mouth every morning.    Marland Kitchen omeprazole (PRILOSEC) 20 MG capsule Take 1 capsule (20 mg total) by mouth 2 (two) times daily before a meal. 60 capsule 5  . Probiotic Product (DIGESTIVE ADVANTAGE) CAPS Take 1 capsule by mouth daily. Reported on 05/08/2015    . Propylene Glycol (SYSTANE BALANCE) 0.6 % SOLN Apply 1 drop to eye 2 (two) times daily. Reported on 05/08/2015    . TURMERIC PO Take by mouth daily.     No current facility-administered medications for this visit.    Allergies:  Cephalexin and Penicillins   Social History: The patient  reports that she has never smoked. She has never used smokeless tobacco. She reports that she does not drink alcohol or use drugs.   ROS:  Please see the history of present illness. Otherwise, complete review of systems is positive for none.  All other systems are reviewed and negative.   Physical Exam: VS:  BP 136/72   Pulse 73   Ht 5\' 3"  (1.6 m)   Wt 177 lb (80.3 kg)   SpO2 99%   BMI 31.35 kg/m ,  BMI Body mass index is 31.35 kg/m.  Wt Readings from Last 3 Encounters:  01/22/16 177 lb (80.3 kg)  11/05/15 174 lb 6.4 oz (79.1 kg)  06/12/15 173 lb 8 oz (78.7 kg)    General: Overweight woman, appears comfortable at rest. HEENT: Conjunctiva and lids normal, oropharynx clear with moist mucosa. Neck: Supple, no elevated JVP or carotid bruits, no thyromegaly. Lungs: Clear to auscultation, nonlabored breathing at rest. Cardiac: Regular rate and rhythm, no S3, 2/6 systolic murmur, no pericardial rub. Abdomen: Soft, nontender,bowel sounds present, no guarding or rebound. Extremities: No pitting edema, distal pulses 2+.  ECG: I personally reviewed the  tracing from 04/19/2015 which showed sinus tachycardia.  Recent Labwork: 04/19/2015: Hemoglobin 14.1 06/12/2015: ALT 27; AST 19; BUN 13; Creatinine, Ser 0.72; Platelets 216; Potassium 5.7; Sodium 146; TSH 1.950     Component Value Date/Time   CHOL 203 (H) 06/12/2015 0929   TRIG 95 06/12/2015 0929   HDL 62 06/12/2015 0929   CHOLHDL 3.3 06/12/2015 0929   CHOLHDL 3.3 05/01/2007 0420   VLDL 6 05/01/2007 0420   LDLCALC 122 (H) 06/12/2015 0929    Other Studies Reviewed Today:  Echocardiogram 05/11/2015: Study Conclusions  - Left ventricle: The cavity size was normal. Wall thickness was  normal. Systolic function was vigorous. The estimated ejection  fraction was in the range of 65% to 70%. Wall motion was normal;  there were no regional wall motion abnormalities. - Aortic valve: Valve area (VTI): 2 cm^2. Valve area (Vmax): 2.18  cm^2. - Technically adequate study.  Assessment and Plan:  Palpitations, patient satisfied with current frequency and intensity and prefers not to take any specific medications at this time. She has had previously documented brief episodes of SVT. If symptoms escalate we can certainly pursue additional treatment options. Continue observation for now.  Current medicines were reviewed with the patient today.   Disposition: Follow-up in one year.  Signed, Satira Sark, MD, [facility] 01/22/2016 10:53 AM    [place] [facility] [place]. [address], [place], [place] [address] Phone: [phone]; Fax: [phone]

## 2016-01-22 ENCOUNTER — Ambulatory Visit (INDEPENDENT_AMBULATORY_CARE_PROVIDER_SITE_OTHER): Payer: BC Managed Care – PPO | Admitting: Cardiology

## 2016-01-22 ENCOUNTER — Encounter: Payer: Self-pay | Admitting: Cardiology

## 2016-01-22 VITALS — BP 136/72 | HR 73 | Ht 63.0 in | Wt 177.0 lb

## 2016-01-22 DIAGNOSIS — R002 Palpitations: Secondary | ICD-10-CM | POA: Diagnosis not present

## 2016-01-22 NOTE — Patient Instructions (Signed)
Your physician wants you to follow-up in: 1 year Dr McDowell You will receive a reminder letter in the mail two months in advance. If you don't receive a letter, please call our office to schedule the follow-up appointment.    Your physician recommends that you continue on your current medications as directed. Please refer to the Current Medication list given to you today.    Thank you for choosing [facility] !         

## 2016-01-31 ENCOUNTER — Other Ambulatory Visit: Payer: Self-pay | Admitting: Nurse Practitioner

## 2016-01-31 DIAGNOSIS — G8929 Other chronic pain: Secondary | ICD-10-CM

## 2016-01-31 DIAGNOSIS — R1013 Epigastric pain: Secondary | ICD-10-CM

## 2016-01-31 DIAGNOSIS — K219 Gastro-esophageal reflux disease without esophagitis: Secondary | ICD-10-CM

## 2016-09-19 ENCOUNTER — Ambulatory Visit (INDEPENDENT_AMBULATORY_CARE_PROVIDER_SITE_OTHER): Payer: BC Managed Care – PPO | Admitting: Adult Health

## 2016-09-19 ENCOUNTER — Encounter: Payer: Self-pay | Admitting: Adult Health

## 2016-09-19 ENCOUNTER — Encounter (INDEPENDENT_AMBULATORY_CARE_PROVIDER_SITE_OTHER): Payer: Self-pay

## 2016-09-19 ENCOUNTER — Other Ambulatory Visit (HOSPITAL_COMMUNITY)
Admission: RE | Admit: 2016-09-19 | Discharge: 2016-09-19 | Disposition: A | Discharge status: Home / Self Care | Payer: BC Managed Care – PPO | Source: Ambulatory Visit | Attending: Adult Health | Admitting: Adult Health

## 2016-09-19 VITALS — BP 110/60 | HR 80 | Ht 63.0 in | Wt 175.0 lb

## 2016-09-19 DIAGNOSIS — Z01419 Encounter for gynecological examination (general) (routine) without abnormal findings: Secondary | ICD-10-CM

## 2016-09-19 DIAGNOSIS — R51 Headache: Secondary | ICD-10-CM

## 2016-09-19 DIAGNOSIS — Z1212 Encounter for screening for malignant neoplasm of rectum: Secondary | ICD-10-CM

## 2016-09-19 DIAGNOSIS — N8189 Other female genital prolapse: Secondary | ICD-10-CM

## 2016-09-19 DIAGNOSIS — N816 Rectocele: Secondary | ICD-10-CM

## 2016-09-19 DIAGNOSIS — G8929 Other chronic pain: Secondary | ICD-10-CM

## 2016-09-19 DIAGNOSIS — Z1211 Encounter for screening for malignant neoplasm of colon: Secondary | ICD-10-CM

## 2016-09-19 LAB — HEMOCCULT GUIAC POC 1CARD (OFFICE): Fecal Occult Blood, POC: NEGATIVE

## 2016-09-19 MED ORDER — HYDROCODONE-ACETAMINOPHEN 5-325 MG PO TABS: 1.0000 | ORAL_TABLET | Freq: Four times a day (QID) | ORAL | 0 refills | Status: DC | PRN

## 2016-09-19 NOTE — Addendum Note (Signed)
Addended by: Diona Fanti A on: 09/19/2016 01:56 PM   Modules accepted: Orders

## 2016-09-19 NOTE — Progress Notes (Signed)
Patient ID: Ruth Warner, female   DOB: 08-Nov-1955, 61 y.o.   MRN: [id number] History of Present Illness: Ruth Warner is a 61 year old white female, married, retired now, in for well woman gyn exam and pap. PCP is Dr Gerarda Fraction.   Current Medications, Allergies, Past Medical History, Past Surgical History, Family History and Social History were reviewed in [facility] record.     Review of Systems:  Patient denies any daily headaches, hearing loss, fatigue, blurred vision, shortness of breath, chest pain, abdominal pain, problems with bowel movements, urination(dribbles at times), or intercourse. No joint pain or mood swings.Occasional back pain.   Physical Exam:BP 110/60 (BP Location: Left Arm, Patient Position: Sitting, Cuff Size: Small)   Pulse 80   Ht 5\' 3"  (1.6 m)   Wt 175 lb (79.4 kg)   BMI 31.00 kg/m  General:  Well developed, well nourished, no acute distress Skin:  Warm and dry Neck:  Midline trachea, normal thyroid, good ROM, no lymphadenopathy Lungs; Clear to auscultation bilaterally Breast:  No dominant palpable mass, retraction, or nipple discharge Cardiovascular: Regular rate and rhythm Abdomen:  Soft, non tender, no hepatosplenomegaly Pelvic:  External genitalia is normal in appearance, no lesions.  The vagina is normal in appearance. Urethra has no lesions or masses. The cervix is smooth, pap with HPV performed.  Uterus is felt to be normal size, shape, and contour.+pelvic relaxation.   No adnexal masses or tenderness noted.Bladder is non tender, no masses felt. Rectal: Good sphincter tone, no polyps, + hemorrhoids felt.  Hemoccult negative.+rectocele Extremities/musculoskeletal:  No swelling or varicosities noted, no clubbing or cyanosis Psych:  No mood changes, alert and cooperative,seems happy PHQ 2 score 0.   Impression: 1. Encounter for gynecological examination with Papanicolaou smear of cervix   2. Pelvic relaxation   3. Rectocele   4. Chronic  intractable headache, unspecified headache type   5. Screening for colorectal cancer       Plan: Physical in 1 year, pap in 3 if normal Mammogram yearly Call when back from vacation for labs(Check CBC,CMP,TSH and lipids,A1c) Refilled Norco 5/325 mg 1 every 6 hours prn pain, #40 no refills

## 2016-09-23 LAB — CYTOLOGY - PAP
Diagnosis: NEGATIVE
HPV: NOT DETECTED

## 2016-10-06 ENCOUNTER — Other Ambulatory Visit: Payer: BC Managed Care – PPO | Admitting: Adult Health

## 2016-11-25 ENCOUNTER — Other Ambulatory Visit: Payer: Self-pay | Admitting: Gastroenterology

## 2016-11-25 DIAGNOSIS — G8929 Other chronic pain: Secondary | ICD-10-CM

## 2016-11-25 DIAGNOSIS — K219 Gastro-esophageal reflux disease without esophagitis: Secondary | ICD-10-CM

## 2016-11-25 DIAGNOSIS — R1013 Epigastric pain: Secondary | ICD-10-CM

## 2016-12-22 ENCOUNTER — Ambulatory Visit (HOSPITAL_COMMUNITY)
Admission: RE | Admit: 2016-12-22 | Discharge: 2016-12-22 | Disposition: A | Discharge status: Home / Self Care | Payer: BC Managed Care – PPO | Source: Ambulatory Visit | Attending: Family Medicine | Admitting: Family Medicine

## 2016-12-22 ENCOUNTER — Other Ambulatory Visit (HOSPITAL_COMMUNITY): Payer: Self-pay | Admitting: Family Medicine

## 2016-12-22 DIAGNOSIS — J40 Bronchitis, not specified as acute or chronic: Secondary | ICD-10-CM

## 2016-12-24 ENCOUNTER — Other Ambulatory Visit: Payer: Self-pay | Admitting: Obstetrics and Gynecology

## 2016-12-24 DIAGNOSIS — Z1231 Encounter for screening mammogram for malignant neoplasm of breast: Secondary | ICD-10-CM

## 2017-01-05 ENCOUNTER — Ambulatory Visit (HOSPITAL_COMMUNITY)
Admission: RE | Admit: 2017-01-05 | Discharge: 2017-01-05 | Disposition: A | Discharge status: Home / Self Care | Payer: BC Managed Care – PPO | Source: Ambulatory Visit | Attending: Obstetrics and Gynecology | Admitting: Obstetrics and Gynecology

## 2017-01-05 DIAGNOSIS — Z1231 Encounter for screening mammogram for malignant neoplasm of breast: Secondary | ICD-10-CM | POA: Diagnosis not present

## 2017-02-03 NOTE — Progress Notes (Signed)
Cardiology Office Note  Date: 02/04/2017   ID: Ruth Warner, DOB June 29, 1955, MRN [id number]  PCP: Redmond School, MD  Primary Cardiologist: Rozann Lesches, MD   Chief Complaint  Patient presents with  . Palpitations    History of Present Illness: Ruth Warner is a 61 y.o. female last seen in October 2017.  She presents for a routine follow-up visit.  Reports occasional, typically brief palpitations, no associated chest pain, lightheadedness, or syncope.  She is now retired.  She is exercising at the gym 3 mornings a week for 45 minutes at a time.  She was evaluated by Dr. Hamilton Capri with the [facility] practice in May 2016 at which time she had a normal treadmill test.  I personally reviewed her ECG today which shows a sinus rhythm with R' in lead V1.  She does not report any other major health changes.  Past Medical History:  Diagnosis Date  . Abdominal pain, chronic, epigastric   . Chronic headaches   . GERD (gastroesophageal reflux disease)   . History of concussion    Staples, hit a shelf  . IBS (irritable bowel syndrome)   . Normal cardiac stress test    February 2009 and May 2016 ([facility])  . Palpitations   . Pelvic relaxation 2015  . Rectocele 2015  . S/P colonoscopy 2001   Dr. Gala Romney: normal. Repeat 10 years  . S/P endoscopy 2001   Dr. Gala Romney: normal    Past Surgical History:  Procedure Laterality Date  . COLONOSCOPY  07/30/2010   Rourk: Single diminutive hyperplastic polyp in the mid descending colon status post cold biopsy removal  . ESOPHAGOGASTRODUODENOSCOPY ENDOSCOPY     With RMR, unable to remember date.  . TONSILLECTOMY      Current Outpatient Medications  Medication Sig Dispense Refill  . Ascorbic Acid (VITAMIN C) 1000 MG tablet Take 1,000 mg by mouth daily. Reported on 05/08/2015    . aspirin EC 81 MG tablet Take 324 mg by mouth once as needed for mild pain. Reported on 05/08/2015    . Cholecalciferol (VITAMIN D) 2000 UNITS tablet Take 2,000  Units by mouth daily. Reported on 05/08/2015    . HYDROcodone-acetaminophen (NORCO/VICODIN) 5-325 MG tablet Take 1 tablet by mouth every 6 (six) hours as needed. 40 tablet 0  . hydrocortisone-pramoxine (ANALPRAM-HC) 2.5-1 % rectal cream Place 1 application rectally 3 (three) times daily. (Patient taking differently: Place 1 application rectally as needed. ) 30 g 1  . Multiple Vitamin (MULTIVITAMIN) capsule Take 1 capsule by mouth daily.      . Omega-3 Fatty Acids (FISH OIL TRIPLE STRENGTH) 1400 MG CAPS Take 1 capsule by mouth every morning.    Marland Kitchen omeprazole (PRILOSEC) 20 MG capsule TAKE 1 CAPSULE BY MOUTH TWICE DAILY BEFORE MEALS. 60 capsule 11  . Probiotic Product (DIGESTIVE ADVANTAGE) CAPS Take 1 capsule by mouth daily. Reported on 05/08/2015    . Propylene Glycol (SYSTANE BALANCE) 0.6 % SOLN Apply 1 drop to eye 2 (two) times daily. Reported on 05/08/2015    . TURMERIC PO Take by mouth daily.     No current facility-administered medications for this visit.    Allergies:  Cephalexin and Penicillins   Social History: The patient  reports that  has never smoked. she has never used smokeless tobacco. She reports that she does not drink alcohol or use drugs.   ROS:  Please see the history of present illness. Otherwise, complete review of systems is positive for none.  All other systems  are reviewed and negative.   Physical Exam: VS:  BP 124/74 (BP Location: Right Arm)   Pulse 75   Ht 5\' 3"  (1.6 m)   Wt 174 lb (78.9 kg)   SpO2 98%   BMI 30.82 kg/m , BMI Body mass index is 30.82 kg/m.  Wt Readings from Last 3 Encounters:  02/04/17 174 lb (78.9 kg)  09/19/16 175 lb (79.4 kg)  01/22/16 177 lb (80.3 kg)    General: Overweight woman, appears comfortable at rest. HEENT: Conjunctiva and lids normal, oropharynx clear. Neck: Supple, no elevated JVP or carotid bruits, no thyromegaly. Lungs: Clear to auscultation, nonlabored breathing at rest. Cardiac: Regular rate and rhythm, no S3 or significant  systolic murmur, no pericardial rub. Abdomen: Soft, nontender, bowel sounds present, no guarding or rebound. Extremities: No pitting edema, distal pulses 2+.  ECG: I personally reviewed the tracing from 04/19/2015 which showed sinus tachycardia.  Recent Labwork:    Component Value Date/Time   CHOL 203 (H) 06/12/2015 0929   TRIG 95 06/12/2015 0929   HDL 62 06/12/2015 0929   CHOLHDL 3.3 06/12/2015 0929   CHOLHDL 3.3 05/01/2007 0420   VLDL 6 05/01/2007 0420   LDLCALC 122 (H) 06/12/2015 0929    Other Studies Reviewed Today:  Echocardiogram 05/11/2015: Study Conclusions  - Left ventricle: The cavity size was normal. Wall thickness was   normal. Systolic function was vigorous. The estimated ejection   fraction was in the range of 65% to 70%. Wall motion was normal;   there were no regional wall motion abnormalities. - Aortic valve: Valve area (VTI): 2 cm^2. Valve area (Vmax): 2.18   cm^2. - Technically adequate study.  Event recorder 05/14/2015: 30 day event recorder. Sinus rhythm and sinus tachycardia noted. Rare PACs and brief atrial runs of 5-10 beats and 10-20 beats - regular and not suggestive of atrial fibrillation. Reported symptoms and fast heart beat and fluttering did not specifically correlate with a specific finding.  Assessment and Plan:  History of palpitations and briefly documented SVT per previous cardiac monitor.  She reports no progression in symptoms and her ECG is stable.  We have discussed continuing with observation for now.  She is not on any specific medications for this.  Current medicines were reviewed with the patient today.   Orders Placed This Encounter  Procedures  . EKG 12-Lead    Disposition: Follow-up in 1 year.  Signed, Satira Sark, MD, [facility] 02/04/2017 8:47 AM    [place] [facility] [place]. [address] [place], [place] [address] Phone: [phone]; Fax: [phone]

## 2017-02-04 ENCOUNTER — Ambulatory Visit: Payer: BC Managed Care – PPO | Admitting: Cardiology

## 2017-02-04 ENCOUNTER — Encounter: Payer: Self-pay | Admitting: Cardiology

## 2017-02-04 VITALS — BP 124/74 | HR 75 | Ht 63.0 in | Wt 174.0 lb

## 2017-02-04 DIAGNOSIS — I471 Supraventricular tachycardia: Secondary | ICD-10-CM

## 2017-02-04 NOTE — Patient Instructions (Signed)

## 2017-03-12 ENCOUNTER — Ambulatory Visit (INDEPENDENT_AMBULATORY_CARE_PROVIDER_SITE_OTHER): Payer: BC Managed Care – PPO | Admitting: Otolaryngology

## 2017-03-12 DIAGNOSIS — J343 Hypertrophy of nasal turbinates: Secondary | ICD-10-CM

## 2017-03-12 DIAGNOSIS — J310 Chronic rhinitis: Secondary | ICD-10-CM | POA: Diagnosis not present

## 2017-04-23 ENCOUNTER — Ambulatory Visit (INDEPENDENT_AMBULATORY_CARE_PROVIDER_SITE_OTHER): Payer: BC Managed Care – PPO | Admitting: Otolaryngology

## 2017-04-23 DIAGNOSIS — J343 Hypertrophy of nasal turbinates: Secondary | ICD-10-CM | POA: Diagnosis not present

## 2017-04-23 DIAGNOSIS — J310 Chronic rhinitis: Secondary | ICD-10-CM

## 2017-04-23 DIAGNOSIS — R0982 Postnasal drip: Secondary | ICD-10-CM | POA: Diagnosis not present

## 2017-05-25 ENCOUNTER — Telehealth: Payer: Self-pay | Admitting: Adult Health

## 2017-05-27 ENCOUNTER — Ambulatory Visit: Payer: BC Managed Care – PPO | Admitting: Adult Health

## 2017-05-28 ENCOUNTER — Encounter (INDEPENDENT_AMBULATORY_CARE_PROVIDER_SITE_OTHER): Payer: Self-pay

## 2017-05-28 ENCOUNTER — Encounter: Payer: Self-pay | Admitting: Adult Health

## 2017-05-28 ENCOUNTER — Ambulatory Visit: Payer: BC Managed Care – PPO | Admitting: Adult Health

## 2017-05-28 VITALS — BP 120/70 | HR 97 | Ht 63.0 in | Wt 176.6 lb

## 2017-05-28 DIAGNOSIS — G8929 Other chronic pain: Secondary | ICD-10-CM

## 2017-05-28 DIAGNOSIS — R51 Headache: Secondary | ICD-10-CM

## 2017-05-28 MED ORDER — HYDROCODONE-ACETAMINOPHEN 5-325 MG PO TABS: 1.0000 | ORAL_TABLET | Freq: Four times a day (QID) | ORAL | 0 refills | Status: DC | PRN

## 2017-05-28 NOTE — Progress Notes (Signed)
Subjective:     Patient ID: Ruth Warner, female   DOB: 14-Oct-1955, 62 y.o.   MRN: [id number]  HPI Ruth Warner is a 62 year old white female, married, PM in for refill on Norco,she has headaches but they are better with age, and she had last refill about 8 months ago, of 82.  Review of Systems +headaches, but are less often worse if traveling  Reviewed past medical,surgical, social and family history. Reviewed medications and allergies.     Objective:   Physical Exam BP 120/70 (BP Location: Right Arm, Patient Position: Sitting, Cuff Size: Small)   Pulse 97   Ht 5\' 3"  (1.6 m)   Wt 176 lb 9.6 oz (80.1 kg)   BMI 31.28 kg/m    Skin warm and dry. Neck: mid line trachea, normal thyroid, good ROM, no lymphadenopathy noted. Lungs: clear to ausculation bilaterally. Cardiovascular: regular rate and rhythm. Assessment:     1. Chronic intractable headache, unspecified headache type       Plan:     Meds ordered this encounter  Medications  . HYDROcodone-acetaminophen (NORCO/VICODIN) 5-325 MG tablet    Sig: Take 1 tablet by mouth every 6 (six) hours as needed.    Dispense:  40 tablet    Refill:  0    Order Specific Question:   Supervising Provider    Answer:   Tania Ade H [[id number]]  Return in 4 months for physical

## 2017-09-14 ENCOUNTER — Ambulatory Visit (HOSPITAL_COMMUNITY)
Admission: RE | Admit: 2017-09-14 | Discharge: 2017-09-14 | Disposition: A | Discharge status: Home / Self Care | Payer: BC Managed Care – PPO | Source: Ambulatory Visit | Attending: Physician Assistant | Admitting: Physician Assistant

## 2017-09-14 ENCOUNTER — Other Ambulatory Visit (HOSPITAL_COMMUNITY): Payer: Self-pay | Admitting: Physician Assistant

## 2017-09-14 DIAGNOSIS — M7731 Calcaneal spur, right foot: Secondary | ICD-10-CM | POA: Diagnosis not present

## 2017-09-14 DIAGNOSIS — M79671 Pain in right foot: Secondary | ICD-10-CM

## 2017-09-14 DIAGNOSIS — X58XXXA Exposure to other specified factors, initial encounter: Secondary | ICD-10-CM | POA: Insufficient documentation

## 2017-09-14 DIAGNOSIS — S92351A Displaced fracture of fifth metatarsal bone, right foot, initial encounter for closed fracture: Secondary | ICD-10-CM | POA: Diagnosis not present

## 2017-09-21 ENCOUNTER — Encounter: Payer: Self-pay | Admitting: Orthopedic Surgery

## 2017-09-21 ENCOUNTER — Ambulatory Visit: Payer: BC Managed Care – PPO | Admitting: Orthopedic Surgery

## 2017-09-21 VITALS — BP 145/70 | HR 67 | Ht 63.0 in | Wt 171.0 lb

## 2017-09-21 DIAGNOSIS — S93601A Unspecified sprain of right foot, initial encounter: Secondary | ICD-10-CM | POA: Diagnosis not present

## 2017-09-21 DIAGNOSIS — S93492A Sprain of other ligament of left ankle, initial encounter: Secondary | ICD-10-CM

## 2017-09-21 NOTE — Progress Notes (Signed)
NEW PATIENT OFFICE VISI  Chief Complaint  Patient presents with  . Foot Injury    right foot injury 09/14/17    62 year old female fell injured her right and left foot and ankle respectively.  She complains mainly of dorsal lateral right foot pain she says the ankle has improved  The patient fell going up some steps x-ray showed an avulsion of the fifth metatarsal at the proximal phalanx however, the patient's primary pain areas on the dorsum of the foot on the right, she said there was a significant amount of swelling on initial injury.  She can ambulate in the boot without pain but has pain when walking without the boot.  The pain is dull now less severe than at initial injury.  Date of injury June 24   Review of Systems  Constitutional: Negative for chills, fever and weight loss.  Respiratory: Negative for shortness of breath.   Cardiovascular: Negative for chest pain.  Neurological: Negative for tingling.     Past Medical History:  Diagnosis Date  . Abdominal pain, chronic, epigastric   . Chronic headaches   . GERD (gastroesophageal reflux disease)   . History of concussion    Staples, hit a shelf  . IBS (irritable bowel syndrome)   . Normal cardiac stress test    February 2009 and May 2016 ([facility])  . Palpitations   . Pelvic relaxation 2015  . Rectocele 2015  . S/P colonoscopy 2001   Dr. Gala Romney: normal. Repeat 10 years  . S/P endoscopy 2001   Dr. Gala Romney: normal    Past Surgical History:  Procedure Laterality Date  . COLONOSCOPY  07/30/2010   Rourk: Single diminutive hyperplastic polyp in the mid descending colon status post cold biopsy removal  . ESOPHAGOGASTRODUODENOSCOPY N/A 02/12/2015   [id number] EGD  . ESOPHAGOGASTRODUODENOSCOPY ENDOSCOPY     With RMR, unable to remember date.  . TONSILLECTOMY      Family History  Problem Relation Age of Onset  . Colon cancer Paternal Grandfather   . Heart attack Mother   . Diabetes Mother   . Diabetes Father   .  Heart attack Father   . Alzheimer's disease Father    Social History   Tobacco Use  . Smoking status: Never Smoker  . Smokeless tobacco: Never Used  Substance Use Topics  . Alcohol use: No    Alcohol/week: 0.0 oz  . Drug use: No    Allergies  Allergen Reactions  . Cephalexin Hives    Patient is not familiar with this allergy  . Penicillins     Has patient had a PCN reaction causing immediate rash, facial/tongue/throat swelling, SOB or lightheadedness with hypotension: Yes Has patient had a PCN reaction causing severe rash involving mucus membranes or skin necrosis: No Has patient had a PCN reaction that required hospitalization No Has patient had a PCN reaction occurring within the last 10 years: Yes If all of the above answers are "NO", then may proceed with Cephalosporin use.     Current Meds  Medication Sig  . aspirin EC 81 MG tablet Take 324 mg by mouth once as needed for mild pain. Reported on 05/08/2015  . Multiple Vitamin (MULTIVITAMIN) capsule Take 1 capsule by mouth daily.    Marland Kitchen omeprazole (PRILOSEC) 20 MG capsule TAKE 1 CAPSULE BY MOUTH TWICE DAILY BEFORE MEALS.    BP (!) 145/70   Pulse 67   Ht 5\' 3"  (1.6 m)   Wt 171 lb (77.6 kg)   BMI 30.29  kg/m   Physical Exam  Constitutional: She is oriented to person, place, and time. She appears well-developed and well-nourished.  Neurological: She is alert and oriented to person, place, and time.  Psychiatric: She has a normal mood and affect. Judgment normal.  Vitals reviewed.   Ortho Exam  Left ankle tenderness over the anterior talofibular ligament no swelling full range of motion drawer test normal no atrophy skin is normal with no rash pulses are normal sensation is intact  When we look at the left foot it is bruised on the bottom or plantar aspect and across the midfoot there is minimal tenderness at the proximal fifth metatarsal she has pain with range of motion of the tarsometatarsal joint but no instability  no atrophy is noted the skin is bruised but intact pulses are good sensation is normal    MEDICAL DECISION SECTION  Xrays were done at [facility] shows no displacement of the TMT joint the avulsion at the proximal fifth metatarsal is noted   Encounter Diagnoses  Name Primary?  . Sprain of right foot, initial encounter Yes  . Sprain of anterior talofibular ligament of left ankle, initial encounter     PLAN: (Rx., injectx, surgery, frx, mri/ct) Weightbearing as tolerated in the cam walker for 6 weeks  Left ankle does not need any treatment  No orders of the defined types were placed in this encounter.   Arther Abbott, MD  09/21/2017 3:11 PM

## 2017-09-21 NOTE — Patient Instructions (Signed)
Wear cam walker full-time when ambulating

## 2017-11-09 ENCOUNTER — Ambulatory Visit (INDEPENDENT_AMBULATORY_CARE_PROVIDER_SITE_OTHER): Payer: BC Managed Care – PPO | Admitting: Orthopedic Surgery

## 2017-11-09 ENCOUNTER — Encounter: Payer: Self-pay | Admitting: Orthopedic Surgery

## 2017-11-09 VITALS — BP 125/67 | HR 82 | Ht 63.0 in | Wt 171.0 lb

## 2017-11-09 DIAGNOSIS — S93492D Sprain of other ligament of left ankle, subsequent encounter: Secondary | ICD-10-CM | POA: Diagnosis not present

## 2017-11-09 DIAGNOSIS — S93601D Unspecified sprain of right foot, subsequent encounter: Secondary | ICD-10-CM

## 2017-11-09 NOTE — Progress Notes (Signed)
Chief Complaint  Patient presents with  . Follow-up    Right foot DOI 09/14/17   Encounter Diagnoses  Name Primary?  . Sprain of right foot, subsequent encounter Yes  . Sprain of anterior talofibular ligament of left ankle, subsequent encounter    62 year old female fell injured her right and left foot and ankle respectively.  She is here for follow-up regarding the avulsion fracture from the base of the fifth metatarsal the right foot and the sprain of the ankle of the left foot  She reports improvement in pain on the left side with very minimal symptoms at this time and complains of pain in the metatarsal phalangeal joint region of the right foot primarily with painful gait when she is out of the cam walker.  Overall she has made significant improvements is ready to come out of her cam walker if I agree  We treated her with weightbearing as tolerated in the cam walker for 6 weeks    Review of Systems  Neurological: Positive for tingling.    Physical Exam  Constitutional: She is oriented to person, place, and time. She appears well-developed and well-nourished.  Neurological: She is alert and oriented to person, place, and time. Gait abnormal.  Slight limp in the boot looks to be more likely secondary to boot height versus shoe height  Psychiatric: She has a normal mood and affect. Judgment normal.  Vitals reviewed.  On the left side we have full range of motion without swelling mild tenderness on the medial side of the joint near the deltoid ligament  On the right side we have tenderness in the metatarsal phalangeal joints of all 5 digits with painful flexion extension especially flexion at the joint  Fifth metatarsal nontender  Encounter Diagnoses  Name Primary?  . Sprain of right foot, subsequent encounter Yes  . Sprain of anterior talofibular ligament of left ankle, subsequent encounter     Recommend Epson salt soaks Gradual return to normal shoewear Toe exercises  with a towel curl Follow-up PRN

## 2017-11-09 NOTE — Patient Instructions (Signed)
Epson salt soaks  Towel curl toe exercises  Shoe wear as comfortable  Exercise bike until walking at home without pain

## 2017-12-09 ENCOUNTER — Encounter: Payer: Self-pay | Admitting: Internal Medicine

## 2017-12-09 ENCOUNTER — Other Ambulatory Visit: Payer: Self-pay | Admitting: Gastroenterology

## 2017-12-09 DIAGNOSIS — R1013 Epigastric pain: Secondary | ICD-10-CM

## 2017-12-09 DIAGNOSIS — K219 Gastro-esophageal reflux disease without esophagitis: Secondary | ICD-10-CM

## 2017-12-09 DIAGNOSIS — G8929 Other chronic pain: Secondary | ICD-10-CM

## 2017-12-09 NOTE — Telephone Encounter (Signed)
Patient has not been seen in over two years.   Needs OV.   Limited refills provided.

## 2017-12-09 NOTE — Telephone Encounter (Signed)
PATIENT SCHEDULED AND LETTER SENT  °

## 2018-02-08 ENCOUNTER — Ambulatory Visit: Payer: BC Managed Care – PPO | Admitting: Cardiology

## 2018-02-10 ENCOUNTER — Ambulatory Visit: Payer: BC Managed Care – PPO | Admitting: Cardiology

## 2018-02-10 ENCOUNTER — Encounter: Payer: Self-pay | Admitting: Cardiology

## 2018-02-10 VITALS — BP 124/68 | HR 62 | Ht 63.0 in | Wt 177.0 lb

## 2018-02-10 DIAGNOSIS — I471 Supraventricular tachycardia: Secondary | ICD-10-CM | POA: Diagnosis not present

## 2018-02-10 NOTE — Patient Instructions (Signed)
Medication Instructions:  Your physician recommends that you continue on your current medications as directed. Please refer to the Current Medication list given to you today.  If you need a refill on your cardiac medications before your next appointment, please call your pharmacy.   Lab work: none If you have labs (blood work) drawn today and your tests are completely normal, you will receive your results only by: Marland Kitchen MyChart Message (if you have MyChart) OR . A paper copy in the mail If you have any lab test that is abnormal or we need to change your treatment, we will call you to review the results.  Testing/Procedures: none  Follow-Up: At [facility], you and your health needs are our priority.  As part of our continuing mission to provide you with exceptional heart care, we have created designated Provider Care Teams.  These Care Teams include your primary Cardiologist (physician) and Advanced Practice Providers (APPs -  Physician Assistants and Nurse Practitioners) who all work together to provide you with the care you need, when you need it. You will need a follow up appointment in 1 years.  Please call our office 2 months in advance to schedule this appointment.  You may see Rozann Lesches, MD or one of the following Advanced Practice Providers on your designated Care Team:   Bernerd Pho, PA-C [facility]) . Ermalinda Barrios, PA-C ([place])  Any Other Special Instructions Will Be Listed Below (If Applicable). none

## 2018-02-10 NOTE — Progress Notes (Signed)
Cardiology Office Note  Date: 02/10/2018   ID: Ruth Warner, DOB 10-28-1955, MRN [id number]  PCP: Redmond School, MD  Primary Cardiologist: Rozann Lesches, MD   Chief Complaint  Patient presents with  . Cardiac follow-up    History of Present Illness: Ruth Warner is a 62 y.o. female last seen in November 2018.  She presents for a routine visit.  Still reports intermittent palpitations but no prolonged episodes, no dizziness or syncope.  She has had no chest pain with activity.  Just got back to exercising regularly after a long hiatus after an orthopedic injury.  I personally reviewed her ECG today which shows sinus rhythm with small R' in lead V1.  Past Medical History:  Diagnosis Date  . Abdominal pain, chronic, epigastric   . Chronic headaches   . GERD (gastroesophageal reflux disease)   . History of concussion    [employer], hit a shelf  . IBS (irritable bowel syndrome)   . Normal cardiac stress test    February 2009 and May 2016 ([facility])  . Palpitations   . Pelvic relaxation 2015  . Rectocele 2015  . S/P colonoscopy 2001   Dr. Gala Romney: normal. Repeat 10 years  . S/P endoscopy 2001   Dr. Gala Romney: normal    Past Surgical History:  Procedure Laterality Date  . COLONOSCOPY  07/30/2010   Rourk: Single diminutive hyperplastic polyp in the mid descending colon status post cold biopsy removal  . ESOPHAGOGASTRODUODENOSCOPY N/A 02/12/2015   GGY:IRSWNI EGD  . ESOPHAGOGASTRODUODENOSCOPY ENDOSCOPY     With RMR, unable to remember date.  . TONSILLECTOMY      Current Outpatient Medications  Medication Sig Dispense Refill  . Ascorbic Acid (VITAMIN C) 1000 MG tablet Take 1,000 mg by mouth daily. Reported on 05/08/2015    . aspirin EC 81 MG tablet Take 324 mg by mouth once as needed for mild pain. Reported on 05/08/2015    . Cholecalciferol (VITAMIN D) 2000 UNITS tablet Take 2,000 Units by mouth daily. Reported on 05/08/2015    . GARLIC PO Take by mouth daily.    . Omega-3  Fatty Acids (FISH OIL TRIPLE STRENGTH) 1400 MG CAPS Take 1 capsule by mouth every morning.    Marland Kitchen omeprazole (PRILOSEC) 20 MG capsule TAKE 1 CAPSULE BY MOUTH TWICE DAILY BEFORE MEALS. 60 capsule 2  . Propylene Glycol (SYSTANE BALANCE) 0.6 % SOLN Apply 1 drop to eye 2 (two) times daily. Reported on 05/08/2015     No current facility-administered medications for this visit.    Allergies:  Cephalexin and Penicillins   Social History: The patient  reports that she has never smoked. She has never used smokeless tobacco. She reports that she does not drink alcohol or use drugs.   ROS:  Please see the history of present illness. Otherwise, complete review of systems is positive for none.  All other systems are reviewed and negative.   Physical Exam: VS:  BP 124/68 (BP Location: Left Arm)   Pulse 62   Ht 5\' 3"  (1.6 m)   Wt 177 lb (80.3 kg)   SpO2 94%   BMI 31.35 kg/m , BMI Body mass index is 31.35 kg/m.  Wt Readings from Last 3 Encounters:  02/10/18 177 lb (80.3 kg)  11/09/17 171 lb (77.6 kg)  09/21/17 171 lb (77.6 kg)    General: Patient appears comfortable at rest. HEENT: Conjunctiva and lids normal, oropharynx clear. Neck: Supple, no elevated JVP or carotid bruits, no thyromegaly. Lungs: Clear to auscultation,  nonlabored breathing at rest. Cardiac: Regular rate and rhythm, no S3 or significant systolic murmur. Abdomen: Soft, nontender, bowel sounds present. Extremities: No pitting edema, distal pulses 2+.  ECG: I personally reviewed the tracing from 02/04/2017 which showed sinus rhythm with R' in lead V1.  Recent Labwork: No results found for requested labs within last 8760 hours.     Component Value Date/Time   CHOL 203 (H) 06/12/2015 0929   TRIG 95 06/12/2015 0929   HDL 62 06/12/2015 0929   CHOLHDL 3.3 06/12/2015 0929   CHOLHDL 3.3 05/01/2007 0420   VLDL 6 05/01/2007 0420   LDLCALC 122 (H) 06/12/2015 0929    Other Studies Reviewed Today:  Echocardiogram 05/11/2015: Study  Conclusions  - Left ventricle: The cavity size was normal. Wall thickness was normal. Systolic function was vigorous. The estimated ejection fraction was in the range of 65% to 70%. Wall motion was normal; there were no regional wall motion abnormalities. - Aortic valve: Valve area (VTI): 2 cm^2. Valve area (Vmax): 2.18 cm^2. - Technically adequate study.  Event recorder 05/14/2015: 30 day event recorder. Sinus rhythm and sinus tachycardia noted. Rare PACs and brief atrial runs of 5-10 beats and 10-20 beats - regular and not suggestive of atrial fibrillation. Reported symptoms and fast heart beat and fluttering did not specifically correlate with a specific finding.  Assessment and Plan:  Stable, intermittent palpitations with previously documented brief episodes of SVT.  Does not report progression in symptoms and I reviewed her ECG.  Continue with observation for now.  Current medicines were reviewed with the patient today.   Orders Placed This Encounter  Procedures  . EKG 12-Lead    Disposition: Follow-up in 1 year.  Signed, Satira Sark, MD, [facility] 02/10/2018 2:02 PM    [facility] at [facility] [address] [place], [place] [address] Phone: [phone]; Fax: [phone]

## 2018-02-16 ENCOUNTER — Other Ambulatory Visit: Payer: Self-pay | Admitting: Obstetrics & Gynecology

## 2018-02-16 DIAGNOSIS — Z1231 Encounter for screening mammogram for malignant neoplasm of breast: Secondary | ICD-10-CM

## 2018-03-08 ENCOUNTER — Ambulatory Visit: Payer: BC Managed Care – PPO | Admitting: Gastroenterology

## 2018-03-08 ENCOUNTER — Telehealth: Payer: Self-pay | Admitting: Gastroenterology

## 2018-03-08 ENCOUNTER — Encounter: Payer: Self-pay | Admitting: Gastroenterology

## 2018-03-08 VITALS — BP 123/74 | HR 69 | Temp 97.8°F | Ht 63.0 in | Wt 174.0 lb

## 2018-03-08 DIAGNOSIS — K580 Irritable bowel syndrome with diarrhea: Secondary | ICD-10-CM | POA: Diagnosis not present

## 2018-03-08 DIAGNOSIS — R1013 Epigastric pain: Secondary | ICD-10-CM | POA: Diagnosis not present

## 2018-03-08 DIAGNOSIS — G8929 Other chronic pain: Secondary | ICD-10-CM

## 2018-03-08 DIAGNOSIS — R1011 Right upper quadrant pain: Secondary | ICD-10-CM | POA: Diagnosis not present

## 2018-03-08 DIAGNOSIS — K219 Gastro-esophageal reflux disease without esophagitis: Secondary | ICD-10-CM

## 2018-03-08 MED ORDER — OMEPRAZOLE 20 MG PO CPDR: DELAYED_RELEASE_CAPSULE | ORAL | 11 refills | Status: DC

## 2018-03-08 NOTE — Patient Instructions (Signed)
1. Continue omeprazole 20mg  once to twice daily before a meal. You can try to wean down to once daily if tolerated.  2. Please have your labs done at your convenience.  3. Please call if you decide to pursue gallbladder ultrasound.

## 2018-03-08 NOTE — Assessment & Plan Note (Signed)
Overall doing reasonably well.  Some postprandial loose stools, gas.  She is never been screened for celiac, recently noted increased symptoms with gluten products.  Check serologies.

## 2018-03-08 NOTE — Progress Notes (Signed)
cc'd to pcp 

## 2018-03-08 NOTE — Progress Notes (Signed)
      Primary Care Physician: Redmond School, MD  Primary Gastroenterologist:  Garfield Cornea, MD   Chief Complaint  Patient presents with  . Gastroesophageal Reflux    HPI: Ruth Warner is a 62 y.o. female here for follow-up.  She has a history of GERD and IBS.  Last seen in August 2017. Next colonoscopy due in 2022.  Overall doing okay. Noticed some new pain in ruq with meals. Not bad but different than her typical epig/luq pain and burning with meals. Doesn't happen all the time. Usually with heavy meal. No n/v. Will start within minutes of a meal. Some pp loose stools and lower abd cramping, not as bad as in the past. No melena, brbpr. Usually within an hour of the meal, symptoms are resolved.   Tried twice this year to wean off omeprazole but after 3 days hurting.   Current Outpatient Medications  Medication Sig Dispense Refill  . Ascorbic Acid (VITAMIN C) 1000 MG tablet Take 1,000 mg by mouth daily. Reported on 05/08/2015    . aspirin EC 81 MG tablet Take 324 mg by mouth once as needed for mild pain. Reported on 05/08/2015    . Cholecalciferol (VITAMIN D) 2000 UNITS tablet Take 2,000 Units by mouth daily. Reported on 05/08/2015    . GARLIC PO Take by mouth daily.    . Omega-3 Fatty Acids (FISH OIL TRIPLE STRENGTH) 1400 MG CAPS Take 1 capsule by mouth every morning.    Marland Kitchen omeprazole (PRILOSEC) 20 MG capsule TAKE 1 CAPSULE BY MOUTH TWICE DAILY BEFORE MEALS. 60 capsule 2  . Propylene Glycol (SYSTANE BALANCE) 0.6 % SOLN Apply 1 drop to eye 2 (two) times daily. Reported on 05/08/2015     No current facility-administered medications for this visit.     Allergies as of 03/08/2018 - Review Complete 03/08/2018  Allergen Reaction Noted  . Cephalexin Hives 04/19/2015  . Dexilant [dexlansoprazole]  03/08/2018  . Penicillins      ROS:  General: Negative for anorexia, weight loss, fever, chills, fatigue, weakness. ENT: Negative for hoarseness, difficulty swallowing , nasal  congestion. CV: Negative for chest pain, angina, palpitations, dyspnea on exertion, peripheral edema.  Respiratory: Negative for dyspnea at rest, dyspnea on exertion, cough, sputum, wheezing.  GI: See history of present illness. GU:  Negative for dysuria, hematuria, urinary incontinence, urinary frequency, nocturnal urination.  Endo: Negative for unusual weight change.    Physical Examination:   BP 123/74   Pulse 69   Temp 97.8 F (36.6 C) (Oral)   Ht 5\' 3"  (1.6 m)   Wt 174 lb (78.9 kg)   BMI 30.82 kg/m   General: Well-nourished, well-developed in no acute distress.  Eyes: No icterus. Mouth: Oropharyngeal mucosa moist and pink , no lesions erythema or exudate. Lungs: Clear to auscultation bilaterally.  Heart: Regular rate and rhythm, no murmurs rubs or gallops.  Abdomen: Bowel sounds are normal, nontender, nondistended, no hepatosplenomegaly or masses, no abdominal bruits or hernia , no rebound or guarding.   Extremities: No lower extremity edema. No clubbing or deformities. Neuro: Alert and oriented x 4   Skin: Warm and dry, no jaundice.   Psych: Alert and cooperative, normal mood and affect.

## 2018-03-08 NOTE — Telephone Encounter (Signed)
Please NIC for OV in 2 years.

## 2018-03-08 NOTE — Assessment & Plan Note (Addendum)
Overall doing fairly well.  She is had some vague right upper quadrant pain occurring postprandially at times.  Typical reflux well controlled but she does have to take Prilosec every day.  Occasionally misses her evening dose but does fairly well.  Discussed potentially trying to wean back to once daily if tolerated.  Her gallbladder remains in situ so I did offer ultrasound gallbladder but at this time she wants to try to monitor symptoms and watch her diet more closely.  She will call let me know if she decides to pursue gallbladder ultrasound.  Return to the office in two years.

## 2018-03-08 NOTE — Telephone Encounter (Signed)
ON RECALL  °

## 2018-03-09 LAB — IGA: IgA/Immunoglobulin A, Serum: 105 mg/dL (ref 87–352)

## 2018-03-09 LAB — HEPATIC FUNCTION PANEL
ALT: 22 IU/L (ref 0–32)
AST: 19 IU/L (ref 0–40)
Albumin: 4.5 g/dL (ref 3.6–4.8)
Alkaline Phosphatase: 83 IU/L (ref 39–117)
Bilirubin Total: 0.4 mg/dL (ref 0.0–1.2)
Bilirubin, Direct: 0.1 mg/dL (ref 0.00–0.40)
Total Protein: 6.8 g/dL (ref 6.0–8.5)

## 2018-03-09 LAB — TISSUE TRANSGLUTAMINASE, IGA: Transglutaminase IgA: <2 U/mL (ref 0–3)

## 2018-03-10 ENCOUNTER — Other Ambulatory Visit: Payer: Self-pay | Admitting: Obstetrics & Gynecology

## 2018-03-10 ENCOUNTER — Ambulatory Visit (HOSPITAL_COMMUNITY)
Admission: RE | Admit: 2018-03-10 | Discharge: 2018-03-10 | Disposition: A | Discharge status: Home / Self Care | Payer: BC Managed Care – PPO | Source: Ambulatory Visit | Attending: Obstetrics & Gynecology | Admitting: Obstetrics & Gynecology

## 2018-03-10 DIAGNOSIS — Z1231 Encounter for screening mammogram for malignant neoplasm of breast: Secondary | ICD-10-CM | POA: Insufficient documentation

## 2018-03-10 DIAGNOSIS — R928 Other abnormal and inconclusive findings on diagnostic imaging of breast: Secondary | ICD-10-CM

## 2018-03-16 ENCOUNTER — Encounter (HOSPITAL_COMMUNITY): Payer: Self-pay

## 2018-03-16 ENCOUNTER — Ambulatory Visit (HOSPITAL_COMMUNITY)
Admission: RE | Admit: 2018-03-16 | Discharge: 2018-03-16 | Disposition: A | Discharge status: Home / Self Care | Payer: BC Managed Care – PPO | Source: Ambulatory Visit | Attending: Obstetrics & Gynecology | Admitting: Obstetrics & Gynecology

## 2018-03-16 DIAGNOSIS — R928 Other abnormal and inconclusive findings on diagnostic imaging of breast: Secondary | ICD-10-CM | POA: Diagnosis not present

## 2018-07-05 ENCOUNTER — Other Ambulatory Visit: Payer: BC Managed Care – PPO | Admitting: Adult Health

## 2018-09-10 ENCOUNTER — Other Ambulatory Visit: Payer: BC Managed Care – PPO | Admitting: Adult Health

## 2018-09-29 ENCOUNTER — Other Ambulatory Visit: Payer: BC Managed Care – PPO | Admitting: Adult Health

## 2018-10-08 ENCOUNTER — Other Ambulatory Visit: Payer: BC Managed Care – PPO | Admitting: Adult Health

## 2018-10-13 ENCOUNTER — Ambulatory Visit (INDEPENDENT_AMBULATORY_CARE_PROVIDER_SITE_OTHER): Payer: BC Managed Care – PPO | Admitting: Adult Health

## 2018-10-13 ENCOUNTER — Encounter: Payer: Self-pay | Admitting: Adult Health

## 2018-10-13 ENCOUNTER — Other Ambulatory Visit: Payer: Self-pay

## 2018-10-13 VITALS — BP 123/71 | HR 68 | Ht 63.0 in | Wt 179.3 lb

## 2018-10-13 DIAGNOSIS — N8189 Other female genital prolapse: Secondary | ICD-10-CM

## 2018-10-13 DIAGNOSIS — Z1212 Encounter for screening for malignant neoplasm of rectum: Secondary | ICD-10-CM | POA: Diagnosis not present

## 2018-10-13 DIAGNOSIS — Z01419 Encounter for gynecological examination (general) (routine) without abnormal findings: Secondary | ICD-10-CM | POA: Diagnosis not present

## 2018-10-13 DIAGNOSIS — E7800 Pure hypercholesterolemia, unspecified: Secondary | ICD-10-CM | POA: Insufficient documentation

## 2018-10-13 DIAGNOSIS — Z1211 Encounter for screening for malignant neoplasm of colon: Secondary | ICD-10-CM | POA: Diagnosis not present

## 2018-10-13 DIAGNOSIS — R7309 Other abnormal glucose: Secondary | ICD-10-CM | POA: Insufficient documentation

## 2018-10-13 LAB — HEMOCCULT GUIAC POC 1CARD (OFFICE): Fecal Occult Blood, POC: NEGATIVE

## 2018-10-13 NOTE — Progress Notes (Addendum)
Patient ID: Ruth Warner, female   DOB: Aug 27, 1955, 63 y.o.   MRN: [id number] History of Present Illness: Ruth Warner is a 63 year old white female, married, G2P2 in for a well woman gyn,exam,she had a normal pap with negative HPV 09/19/16. PCP is Dr Gerarda Fraction.    Current Medications, Allergies, Past Medical History, Past Surgical History, Family History and Social History were reviewed in [facility] record.     Review of Systems: Patient denies any headaches, hearing loss, fatigue, blurred vision, shortness of breath, chest pain, abdominal pain, problems with bowel movements, or intercourse. No joint pain or mood swings. Has noticed eye brows and lashes thinning and will pee when stand after wipes, just a small amount. No spotting since period stopped.  Physical Exam:BP 123/71 (BP Location: Left Arm, Patient Position: Sitting, Cuff Size: Normal)   Pulse 68   Ht 5\' 3"  (1.6 m)   Wt 179 lb 4.8 oz (81.3 kg)   BMI 31.76 kg/m  General:  Well developed, well nourished, no acute distress Skin:  Warm and dry Neck:  Midline trachea, normal thyroid, good ROM, no lymphadenopathy,no carotid bruits heard  Lungs; Clear to auscultation bilaterally Breast:  No dominant palpable mass, retraction, or nipple discharge Cardiovascular: Regular rate and rhythm Abdomen:  Soft, non tender, no hepatosplenomegaly Pelvic:  External genitalia is normal in appearance, no lesions.  The vagina is normal in appearance. Urethra has no lesions or masses.Has mild cystocele. The cervix is bulbous. +pelvic relaxation. Uterus is felt to be normal size, shape, and contour.  No adnexal masses or tenderness noted.Bladder is non tender, no masses felt. Rectal: Good sphincter tone, no polyps, or hemorrhoids felt.  Hemoccult negative. Extremities/musculoskeletal:  No swelling or varicosities noted, no clubbing or cyanosis Psych:  No mood changes, alert and cooperative,seems happy Fall risk is low. PHQ 9 score  0. Examination chaperoned by Diona Fanti CMA. Pt aware has mild cystocele and pelvic relaxation, try peeing,stand then pee again, may need pessary in time.  Impression: 1. Encounter for well woman exam with routine gynecological exam   2. Screening for colorectal cancer   3. Elevated cholesterol   4. Elevated hemoglobin A1c   5. Pelvic relaxation       Plan: Check CBC,CMP,TSH and lipids,A1c Pap and physical in 1 year Mammogram yearly Colonoscopy pr GI, last one was May 2012.

## 2018-10-14 LAB — CBC
Hematocrit: 42.8 % (ref 34.0–46.6)
Hemoglobin: 14.4 g/dL (ref 11.1–15.9)
MCH: 29.5 pg (ref 26.6–33.0)
MCHC: 33.6 g/dL (ref 31.5–35.7)
MCV: 88 fL (ref 79–97)
Platelets: 216 10*3/uL (ref 150–450)
RBC: 4.88 x10E6/uL (ref 3.77–5.28)
RDW: 12.8 % (ref 11.7–15.4)
WBC: 5.3 10*3/uL (ref 3.4–10.8)

## 2018-10-14 LAB — COMPREHENSIVE METABOLIC PANEL
ALT: 30 IU/L (ref 0–32)
AST: 20 IU/L (ref 0–40)
Albumin/Globulin Ratio: 2 (ref 1.2–2.2)
Albumin: 4.6 g/dL (ref 3.8–4.8)
Alkaline Phosphatase: 77 IU/L (ref 39–117)
BUN/Creatinine Ratio: 14 (ref 12–28)
BUN: 10 mg/dL (ref 8–27)
Bilirubin Total: 0.5 mg/dL (ref 0.0–1.2)
CO2: 25 mmol/L (ref 20–29)
Calcium: 9.8 mg/dL (ref 8.7–10.3)
Chloride: 102 mmol/L (ref 96–106)
Creatinine, Ser: 0.73 mg/dL (ref 0.57–1.00)
GFR calc Af Amer: 101 mL/min/{1.73_m2} (ref >59)
GFR calc non Af Amer: 88 mL/min/{1.73_m2} (ref >59)
Globulin, Total: 2.3 g/dL (ref 1.5–4.5)
Glucose: 99 mg/dL (ref 65–99)
Potassium: 5 mmol/L (ref 3.5–5.2)
Sodium: 141 mmol/L (ref 134–144)
Total Protein: 6.9 g/dL (ref 6.0–8.5)

## 2018-10-14 LAB — LIPID PANEL
Chol/HDL Ratio: 3.6 ratio (ref 0.0–4.4)
Cholesterol, Total: 165 mg/dL (ref 100–199)
HDL: 46 mg/dL (ref >39)
LDL Calculated: 99 mg/dL (ref 0–99)
Triglycerides: 100 mg/dL (ref 0–149)
VLDL Cholesterol Cal: 20 mg/dL (ref 5–40)

## 2018-10-14 LAB — HEMOGLOBIN A1C
Est. average glucose Bld gHb Est-mCnc: 117 mg/dL
Hgb A1c MFr Bld: 5.7 % — ABNORMAL HIGH (ref 4.8–5.6)

## 2018-10-14 LAB — TSH: TSH: 1.85 u[IU]/mL (ref 0.450–4.500)

## 2018-10-21 ENCOUNTER — Other Ambulatory Visit: Payer: BC Managed Care – PPO | Admitting: Adult Health

## 2019-02-02 ENCOUNTER — Telehealth: Payer: Self-pay | Admitting: Adult Health

## 2019-02-02 MED ORDER — HYDROCORT-PRAMOXINE (PERIANAL) 2.5-1 % EX CREA: 1.0000 "application " | TOPICAL_CREAM | Freq: Three times a day (TID) | CUTANEOUS | 3 refills | Status: DC

## 2019-02-02 NOTE — Addendum Note (Signed)
Addended by: Derrek Monaco A on: 02/02/2019 01:18 PM   Modules accepted: Orders

## 2019-02-02 NOTE — Telephone Encounter (Signed)
Can you call in some Hydrocortisone Acetate 2.5% Pramoxine HCI 1% Cream for Lexis's Hemmorrhoids.

## 2019-02-02 NOTE — Telephone Encounter (Signed)
Refilled analpram HC

## 2019-04-06 ENCOUNTER — Ambulatory Visit: Payer: BC Managed Care – PPO | Attending: Internal Medicine

## 2019-04-06 ENCOUNTER — Other Ambulatory Visit: Payer: Self-pay

## 2019-04-06 DIAGNOSIS — Z20822 Contact with and (suspected) exposure to covid-19: Secondary | ICD-10-CM

## 2019-04-07 LAB — NOVEL CORONAVIRUS, NAA: SARS-CoV-2, NAA: DETECTED — AB

## 2019-04-15 ENCOUNTER — Other Ambulatory Visit (HOSPITAL_COMMUNITY): Payer: Self-pay | Admitting: Internal Medicine

## 2019-04-15 ENCOUNTER — Ambulatory Visit (HOSPITAL_COMMUNITY)
Admission: RE | Admit: 2019-04-15 | Discharge: 2019-04-15 | Disposition: A | Discharge status: Home / Self Care | Payer: BC Managed Care – PPO | Source: Ambulatory Visit | Attending: Internal Medicine | Admitting: Internal Medicine

## 2019-04-15 ENCOUNTER — Other Ambulatory Visit: Payer: Self-pay

## 2019-04-15 DIAGNOSIS — R05 Cough: Secondary | ICD-10-CM | POA: Insufficient documentation

## 2019-04-15 DIAGNOSIS — R059 Cough, unspecified: Secondary | ICD-10-CM

## 2019-04-17 ENCOUNTER — Other Ambulatory Visit: Payer: Self-pay

## 2019-04-17 ENCOUNTER — Emergency Department (HOSPITAL_COMMUNITY): Payer: BC Managed Care – PPO

## 2019-04-17 ENCOUNTER — Emergency Department (HOSPITAL_COMMUNITY)
Admission: EM | Admit: 2019-04-17 | Discharge: 2019-04-17 | Disposition: A | Discharge status: Home / Self Care | Payer: BC Managed Care – PPO | Attending: Emergency Medicine | Admitting: Emergency Medicine

## 2019-04-17 ENCOUNTER — Encounter (HOSPITAL_COMMUNITY): Payer: Self-pay | Admitting: *Deleted

## 2019-04-17 DIAGNOSIS — E876 Hypokalemia: Secondary | ICD-10-CM | POA: Diagnosis not present

## 2019-04-17 DIAGNOSIS — Z79899 Other long term (current) drug therapy: Secondary | ICD-10-CM | POA: Diagnosis not present

## 2019-04-17 DIAGNOSIS — U071 COVID-19: Secondary | ICD-10-CM | POA: Diagnosis not present

## 2019-04-17 DIAGNOSIS — Z7982 Long term (current) use of aspirin: Secondary | ICD-10-CM | POA: Insufficient documentation

## 2019-04-17 DIAGNOSIS — J189 Pneumonia, unspecified organism: Secondary | ICD-10-CM | POA: Insufficient documentation

## 2019-04-17 DIAGNOSIS — R05 Cough: Secondary | ICD-10-CM | POA: Diagnosis present

## 2019-04-17 DIAGNOSIS — J1282 Pneumonia due to coronavirus disease 2019: Secondary | ICD-10-CM

## 2019-04-17 LAB — CBC WITH DIFFERENTIAL/PLATELET
Abs Immature Granulocytes: 0.02 10*3/uL (ref 0.00–0.07)
Basophils Absolute: 0 10*3/uL (ref 0.0–0.1)
Basophils Relative: 0 %
Eosinophils Absolute: 0 10*3/uL (ref 0.0–0.5)
Eosinophils Relative: 0 %
HCT: 42.6 % (ref 36.0–46.0)
Hemoglobin: 14.2 g/dL (ref 12.0–15.0)
Immature Granulocytes: 0 %
Lymphocytes Relative: 18 %
Lymphs Abs: 1.1 10*3/uL (ref 0.7–4.0)
MCH: 29.8 pg (ref 26.0–34.0)
MCHC: 33.3 g/dL (ref 30.0–36.0)
MCV: 89.5 fL (ref 80.0–100.0)
Monocytes Absolute: 0.5 10*3/uL (ref 0.1–1.0)
Monocytes Relative: 8 %
Neutro Abs: 4.3 10*3/uL (ref 1.7–7.7)
Neutrophils Relative %: 74 %
Platelets: 209 10*3/uL (ref 150–400)
RBC: 4.76 MIL/uL (ref 3.87–5.11)
RDW: 13.2 % (ref 11.5–15.5)
WBC: 5.8 10*3/uL (ref 4.0–10.5)
nRBC: 0 % (ref 0.0–0.2)

## 2019-04-17 LAB — URINALYSIS, ROUTINE W REFLEX MICROSCOPIC
Bilirubin Urine: NEGATIVE
Glucose, UA: NEGATIVE mg/dL
Hgb urine dipstick: NEGATIVE
Ketones, ur: NEGATIVE mg/dL
Leukocytes,Ua: NEGATIVE
Nitrite: NEGATIVE
Protein, ur: NEGATIVE mg/dL
Specific Gravity, Urine: 1.01 (ref 1.005–1.030)
pH: 7 (ref 5.0–8.0)

## 2019-04-17 LAB — BASIC METABOLIC PANEL
Anion gap: 13 (ref 5–15)
BUN: 10 mg/dL (ref 8–23)
CO2: 24 mmol/L (ref 22–32)
Calcium: 9.2 mg/dL (ref 8.9–10.3)
Chloride: 100 mmol/L (ref 98–111)
Creatinine, Ser: 0.73 mg/dL (ref 0.44–1.00)
GFR calc Af Amer: >60 mL/min (ref >60)
GFR calc non Af Amer: >60 mL/min (ref >60)
Glucose, Bld: 131 mg/dL — ABNORMAL HIGH (ref 70–99)
Potassium: 3.1 mmol/L — ABNORMAL LOW (ref 3.5–5.1)
Sodium: 137 mmol/L (ref 135–145)

## 2019-04-17 MED ORDER — ALBUTEROL SULFATE HFA 108 (90 BASE) MCG/ACT IN AERS
2.0000 | INHALATION_SPRAY | Freq: Once | RESPIRATORY_TRACT | Status: AC
  Administered 2019-04-17: 2 via RESPIRATORY_TRACT
  Filled 2019-04-17: qty 6.7

## 2019-04-17 MED ORDER — SODIUM CHLORIDE 0.9 % IV BOLUS
1000.0000 mL | Freq: Once | INTRAVENOUS | Status: AC
  Administered 2019-04-17: 1000 mL via INTRAVENOUS

## 2019-04-17 MED ORDER — BENZONATATE 100 MG PO CAPS: 200.0000 mg | ORAL_CAPSULE | Freq: Three times a day (TID) | ORAL | 0 refills | Status: DC | PRN

## 2019-04-17 MED ORDER — BENZONATATE 100 MG PO CAPS
200.0000 mg | ORAL_CAPSULE | Freq: Once | ORAL | Status: AC
  Administered 2019-04-17: 13:00:00 200 mg via ORAL
  Filled 2019-04-17: qty 2

## 2019-04-17 MED ORDER — POTASSIUM CHLORIDE CRYS ER 20 MEQ PO TBCR: 20.0000 meq | EXTENDED_RELEASE_TABLET | Freq: Two times a day (BID) | ORAL | 0 refills | Status: DC

## 2019-04-17 MED ORDER — HYDROCOD POLST-CPM POLST ER 10-8 MG/5ML PO SUER: 5.0000 mL | Freq: Two times a day (BID) | ORAL | 0 refills | Status: DC | PRN

## 2019-04-17 NOTE — ED Notes (Signed)
Pt ambulatory in room. O2 sats remained 96-99% on RA. Pt denies any increased SOB while ambulating.

## 2019-04-17 NOTE — Discharge Instructions (Addendum)
You will need to maintain home quarantine until you have been fever free for 24 hours (not taking antipyretics like tylenol or motrin).  Finish your prescription for the doxycycline that your primary doctor prescribed.  Use the Tessalon Perls as prescribed for help with your cough.  If you need any additional cough relief you may use the Tussionex that was prescribed.  Use caution however as Tussionex can cause drowsiness.  Do not drive within 4 hours of taking this medication.  Continue to push fluids to help maintain hydration.  Take Tylenol or Motrin for fever reduction as needed.  Your lab tests are reassuring today.  Your chest x-ray is consistent with Covid pneumonia.  However since your oxygen levels are normal, you do not need to be hospitalized for your infection.  However, please get rechecked for any worsening symptoms including or shortness of breath or weakness.  You may try using the albuterol inhaler you were given taking 2 puffs every 4 hours which can also help with cough and shortness of breath.

## 2019-04-17 NOTE — ED Triage Notes (Signed)
Patient presents to the ED with cough since being covid positive 14 days ago. Patient called her PCP for the cough, and had an antibiotic prescribed on Tuesday.  Patient reports worsening cough and now has diarrhea.

## 2019-04-17 NOTE — ED Provider Notes (Signed)
[facility] EMERGENCY DEPARTMENT Provider Note   CSN: [id number] Arrival date & time: 04/17/19  0944     History Chief Complaint  Patient presents with  . Cough    Ruth Warner is a 64 y.o. female with past medical history as outlined below, significant for testing positive for COVID-19 on January 13, presenting with persistent symptoms including diarrhea which has been persistent since onset of this illness, although she denies having any diarrhea today.  Typically has been having 4-5 episodes of watery stools daily.  She has had a moderate nonproductive cough during this illness which has become progressively worse this week.  She had me visit with her PCP early in the week and she was placed on 10-day course of doxycycline and also prescribed a prednisone taper which she completed yesterday.  She denies shortness of breath or wheezing, she is occasionally coughing up white sputum.  She is also been spiking fevers to 101 which have responded to Tylenol.  She has been able to tolerate p.o. fluids and has been pushing Gatorade and water.  Denies urinary complaints.  Also denies chest pain or shortness of breath.  She has taken Imodium early in the course of her illness which did not improve her diarrhea.  She has found no other alleviators for her cough symptom.  The history is provided by the patient.  Cough Associated symptoms: fever   Associated symptoms: no chest pain, no headaches, no rash, no shortness of breath, no sore throat and no wheezing        Past Medical History:  Diagnosis Date  . Abdominal pain, chronic, epigastric   . Chronic headaches   . GERD (gastroesophageal reflux disease)   . History of concussion    Staples, hit a shelf  . IBS (irritable bowel syndrome)   . Normal cardiac stress test    February 2009 and May 2016 ([facility])  . Palpitations   . Pelvic relaxation 2015  . Rectocele 2015  . S/P colonoscopy 2001   Dr. Gala Romney: normal. Repeat 10 years  . S/P  endoscopy 2001   Dr. Gala Romney: normal    Patient Active Problem List   Diagnosis Date Noted  . Elevated hemoglobin A1c 10/13/2018  . Elevated cholesterol 10/13/2018  . Screening for colorectal cancer 10/13/2018  . Encounter for well woman exam with routine gynecological exam 10/13/2018  . RUQ pain 03/08/2018  . Abdominal pain, chronic, epigastric 06/19/2014  . Pelvic relaxation 04/12/2013  . Chronic headaches 04/12/2013  . Rectocele 04/12/2013  . Screening for colon cancer 07/23/2010  . GERD 12/25/2008  . IRRITABLE BOWEL SYNDROME 12/25/2008  . PERIMENOPAUSAL SYNDROME 12/25/2008    Past Surgical History:  Procedure Laterality Date  . COLONOSCOPY  07/30/2010   Rourk: Single diminutive hyperplastic polyp in the mid descending colon status post cold biopsy removal  . ESOPHAGOGASTRODUODENOSCOPY N/A 02/12/2015   [id number] EGD  . ESOPHAGOGASTRODUODENOSCOPY ENDOSCOPY     With RMR, unable to remember date.  . TONSILLECTOMY       OB History    Gravida  2   Para  2   Term      Preterm      AB      Living  2     SAB      TAB      Ectopic      Multiple      Live Births  2           Family History  Problem Relation  Age of Onset  . Colon cancer Paternal Grandfather   . Heart attack Mother   . Diabetes Mother   . Diabetes Father   . Heart attack Father   . Alzheimer's disease Father     Social History   Tobacco Use  . Smoking status: Never Smoker  . Smokeless tobacco: Never Used  Substance Use Topics  . Alcohol use: No    Alcohol/week: 0.0 standard drinks  . Drug use: No    Home Medications Prior to Admission medications   Medication Sig Start Date End Date Taking? Authorizing Provider  Ascorbic Acid (VITAMIN C) 1000 MG tablet Take 1,000 mg by mouth daily. Reported on 05/08/2015   Yes [provider]  aspirin EC 81 MG tablet Take 324 mg by mouth once as needed for mild pain. Reported on 05/08/2015   Yes [provider]    Cholecalciferol (VITAMIN D) 2000 UNITS tablet Take 2,000 Units by mouth daily. Reported on 05/08/2015   Yes [provider]  doxycycline (VIBRA-TABS) 100 MG tablet Take 100 mg by mouth 2 (two) times daily. 04/12/19  Yes [provider]  GARLIC PO Take by mouth daily.   Yes [provider]  methylPREDNISolone (MEDROL DOSEPAK) 4 MG TBPK tablet Take 1 tablet by mouth as directed. 6,5,4,3,2,1 04/12/19  Yes [provider]  omeprazole (PRILOSEC) 20 MG capsule TAKE 1 CAPSULE BY MOUTH TWICE DAILY BEFORE MEALS. 03/08/18  Yes Mahala Menghini, PA-C  benzonatate (TESSALON) 100 MG capsule Take 2 capsules (200 mg total) by mouth 3 (three) times daily as needed for cough. 04/17/19   Evalee Jefferson, PA-C  chlorpheniramine-HYDROcodone (TUSSIONEX PENNKINETIC ER) 10-8 MG/5ML SUER Take 5 mLs by mouth every 12 (twelve) hours as needed for cough. 04/17/19   Evalee Jefferson, PA-C  hydrocortisone-pramoxine [facility]) 2.5-1 % rectal cream Place 1 application rectally 3 (three) times daily. Patient not taking: Reported on 04/17/2019 02/02/19   Derrek Monaco A, NP  potassium chloride SA (KLOR-CON) 20 MEQ tablet Take 1 tablet (20 mEq total) by mouth 2 (two) times daily. 04/17/19   Evalee Jefferson, PA-C    Allergies    Cephalexin, Dexilant [dexlansoprazole], and Penicillins  Review of Systems   Review of Systems  Constitutional: Positive for fever.  HENT: Negative for congestion and sore throat.   Eyes: Negative.   Respiratory: Positive for cough. Negative for chest tightness, shortness of breath and wheezing.   Cardiovascular: Negative for chest pain, palpitations and leg swelling.  Gastrointestinal: Positive for diarrhea. Negative for abdominal pain, nausea and vomiting.  Genitourinary: Negative.   Musculoskeletal: Negative for arthralgias, joint swelling and neck pain.  Skin: Negative.  Negative for rash and wound.  Neurological: Negative for dizziness, weakness, light-headedness,  numbness and headaches.  Psychiatric/Behavioral: Negative.     Physical Exam Updated Vital Signs BP (!) 111/91   Pulse 81   Temp 99.3 F (37.4 C) (Oral)   Resp 14   Ht 5\' 3"  (1.6 m)   Wt 81.6 kg   SpO2 96%   BMI 31.89 kg/m   Physical Exam Vitals and nursing note reviewed.  Constitutional:      Appearance: She is well-developed.  HENT:     Head: Normocephalic and atraumatic.  Eyes:     Conjunctiva/sclera: Conjunctivae normal.  Cardiovascular:     Rate and Rhythm: Normal rate and regular rhythm.     Heart sounds: Normal heart sounds.  Pulmonary:     Effort: Pulmonary effort is normal. No respiratory distress.  Breath sounds: Normal breath sounds. No wheezing.     Comments: Speaking in fragmented sentences interrupted by dry sounding cough. Abdominal:     General: Bowel sounds are normal. There is no distension.     Palpations: Abdomen is soft.     Tenderness: There is no abdominal tenderness. There is no guarding.  Musculoskeletal:        General: No tenderness. Normal range of motion.     Cervical back: Normal range of motion.     Right lower leg: No edema.     Left lower leg: No edema.  Skin:    General: Skin is warm and dry.  Neurological:     Mental Status: She is alert.     ED Results / Procedures / Treatments   Labs (all labs ordered are listed, but only abnormal results are displayed) Labs Reviewed  BASIC METABOLIC PANEL - Abnormal; Notable for the following components:      Result Value   Potassium 3.1 (*)    Glucose, Bld 131 (*)    All other components within normal limits  URINALYSIS, ROUTINE W REFLEX MICROSCOPIC - Abnormal; Notable for the following components:   APPearance HAZY (*)    All other components within normal limits  CBC WITH DIFFERENTIAL/PLATELET    EKG None  Radiology DG Chest 2 View  Result Date: 04/15/2019 CLINICAL DATA:  Cough. EXAM: CHEST - 2 VIEW COMPARISON:  December 22, 2016 FINDINGS: Mild atelectasis and/or infiltrate  is seen within the left lung base. There is no evidence of a pleural effusion or pneumothorax. The heart size and mediastinal contours are within normal limits. The visualized skeletal structures are unremarkable. IMPRESSION: 1. Mild left basilar atelectasis and/or infiltrate. Electronically Signed   By: Virgina Norfolk M.D.   On: 04/15/2019 19:49   DG Chest Portable 1 View  Result Date: 04/17/2019 CLINICAL DATA:  Cough, fever, COVID-19 positive EXAM: PORTABLE CHEST 1 VIEW COMPARISON:  04/15/2019 FINDINGS: There are bilateral interstitial and patchy alveolar airspace opacities. There is no pleural effusion or pneumothorax. The heart and mediastinal contours are unremarkable. There is no acute osseous abnormality. IMPRESSION: Bilateral interstitial and patchy alveolar airspace opacities concerning for multilobar pneumonia including atypical viral pneumonia. Electronically Signed   By: Kathreen Devoid   On: 04/17/2019 10:35    Procedures Procedures (including critical care time)  Medications Ordered in ED Medications  albuterol (VENTOLIN HFA) 108 (90 Base) MCG/ACT inhaler 2 puff (2 puffs Inhalation Given 04/17/19 1017)  sodium chloride 0.9 % bolus 1,000 mL (0 mLs Intravenous Stopped 04/17/19 1213)  benzonatate (TESSALON) capsule 200 mg (200 mg Oral Given 04/17/19 1316)    ED Course  I have reviewed the triage vital signs and the nursing notes.  Pertinent labs & imaging results that were available during my care of the patient were reviewed by me and considered in my medical decision making (see chart for details).    MDM Rules/Calculators/A&P                      Patient who is now 2 weeks into her COVID-19 infection with persistent diarrhea and a dry persistent cough.  Labs and chest x-ray were reviewed and discussed with patient.  She does have mild hypokalemia which was treated.  She was given IV fluids, also given a dose of albuterol per MDI.  This did not improve her cough symptom.  Added  Tessalon Perles.  She is currently on doxycycline by her PCP, she  was advised to complete this antibiotic.  She finished a prednisone taper yesterday.  She endorsed getting very little sleep last night secondary to the disruption from her cough.  Added Tussionex if needed for additional cough relief.  Encouraged continued fluids, rest.  Strict return precautions were discussed.  She was ambulated in her room with a pulse ox and she had no complaints of shortness of breath or desaturation. Final Clinical Impression(s) / ED Diagnoses Final diagnoses:  Pneumonia due to COVID-19 virus  Hypokalemia    Rx / DC Orders ED Discharge Orders         Ordered    benzonatate (TESSALON) 100 MG capsule  3 times daily PRN,   Status:  Discontinued     04/17/19 1246    chlorpheniramine-HYDROcodone (TUSSIONEX PENNKINETIC ER) 10-8 MG/5ML SUER  Every 12 hours PRN,   Status:  Discontinued     04/17/19 1246    benzonatate (TESSALON) 100 MG capsule  3 times daily PRN     04/17/19 1320    chlorpheniramine-HYDROcodone (TUSSIONEX PENNKINETIC ER) 10-8 MG/5ML SUER  Every 12 hours PRN     04/17/19 1320    potassium chloride SA (KLOR-CON) 20 MEQ tablet  2 times daily     04/17/19 1336           Evalee Jefferson, Hershal Coria 04/17/19 1348    Dorie Rank, MD 04/19/19 1239

## 2019-04-26 ENCOUNTER — Telehealth: Payer: Self-pay | Admitting: Cardiology

## 2019-04-26 NOTE — Telephone Encounter (Signed)

## 2019-04-28 NOTE — Progress Notes (Signed)
Virtual Visit via Telephone Note   This visit type was conducted due to national recommendations for restrictions regarding the COVID-19 Pandemic (e.g. social distancing) in an effort to limit this patient's exposure and mitigate transmission in our community.  Due to her co-morbid illnesses, this patient is at least at moderate risk for complications without adequate follow up.  This format is felt to be most appropriate for this patient at this time.  The patient did not have access to video technology/had technical difficulties with video requiring transitioning to audio format only (telephone).  All issues noted in this document were discussed and addressed.  No physical exam could be performed with this format.  Please refer to the patient's chart for her  consent to telehealth for [facility].   Date:  04/29/2019   ID:  Ruth Warner, DOB 26-May-1955, MRN [id number]  Patient Location: Home Provider Location: Home  PCP:  Redmond School, MD  Cardiologist:  Rozann Lesches, MD Electrophysiologist:  None   Evaluation Performed:  Follow-Up Visit  Chief Complaint:  Cardiac follow-up  History of Present Illness:    Ruth Warner is a 64 y.o. female last seen in November 2019.  We had difficulty making a video connection and ultimately spoke by phone today.  She wanted to check in regarding recent symptoms.  She was diagnosed with COVID-19 in mid January, initially mild symptoms, but ultimately diagnosis of pneumonia in late January, treated with steroids and antibiotics.  She feels better in terms of breathing status, not hypoxic now, no further coughing.  She has however had orthostatic fatigue and elevated heart rate.  She states that her heart rate can be in the 60s to 70s while seated, and then jumps up to around 110 when she is standing.  She has only very brief episodes with a heart rate might reach 170 for a second or so based on her watch, no sustained tachypalpitations however.   She has had no syncope with this.  At this time she does not have a blood pressure cuff at home.  She states that she saw her PCP recently, no additional testing performed.  I did review her lab work done at ER visit in late January as outlined below.  I reviewed her current medications which are outlined below.  Past Medical History:  Diagnosis Date  . Abdominal pain, chronic, epigastric   . Chronic headaches   . GERD (gastroesophageal reflux disease)   . History of concussion    Staples, hit a shelf  . IBS (irritable bowel syndrome)   . Normal cardiac stress test    February 2009 and May 2016 ([facility])  . Palpitations   . Pelvic relaxation 2015  . Rectocele 2015  . S/P colonoscopy 2001   Dr. Gala Romney: normal. Repeat 10 years  . S/P endoscopy 2001   Dr. Gala Romney: normal   Past Surgical History:  Procedure Laterality Date  . COLONOSCOPY  07/30/2010   Rourk: Single diminutive hyperplastic polyp in the mid descending colon status post cold biopsy removal  . ESOPHAGOGASTRODUODENOSCOPY N/A 02/12/2015   [id number] EGD  . ESOPHAGOGASTRODUODENOSCOPY ENDOSCOPY     With RMR, unable to remember date.  . TONSILLECTOMY       Current Meds  Medication Sig  . Ascorbic Acid (VITAMIN C) 1000 MG tablet Take 1,000 mg by mouth daily. Reported on 05/08/2015  . aspirin EC 81 MG tablet Take 324 mg by mouth once as needed for mild pain. Reported on 05/08/2015  . benzonatate (  TESSALON) 100 MG capsule Take 2 capsules (200 mg total) by mouth 3 (three) times daily as needed for cough.  . Cholecalciferol (VITAMIN D) 2000 UNITS tablet Take 2,000 Units by mouth daily. Reported on 05/08/2015  . GARLIC PO Take by mouth daily.  . hydrocortisone-pramoxine (ANALPRAM-HC) 2.5-1 % rectal cream Place 1 application rectally 3 (three) times daily.  Marland Kitchen omeprazole (PRILOSEC) 20 MG capsule TAKE 1 CAPSULE BY MOUTH TWICE DAILY BEFORE MEALS.  Marland Kitchen zinc gluconate 50 MG tablet Take 50 mg by mouth daily.     Allergies:   Cephalexin,  Dexilant [dexlansoprazole], and Penicillins   Social History   Tobacco Use  . Smoking status: Never Smoker  . Smokeless tobacco: Never Used  Substance Use Topics  . Alcohol use: No    Alcohol/week: 0.0 standard drinks  . Drug use: No     Family Hx: The patient's family history includes Alzheimer's disease in her father; Colon cancer in her paternal grandfather; Diabetes in her father and mother; Heart attack in her father and mother.  ROS:   No fevers or chills, no further cough.  Appetite stable although she did lose 7 pounds since January.  Prior CV studies:   The following studies were reviewed today:  Echocardiogram 05/11/2015: Study Conclusions  - Left ventricle: The cavity size was normal. Wall thickness was normal. Systolic function was vigorous. The estimated ejection fraction was in the range of 65% to 70%. Wall motion was normal; there were no regional wall motion abnormalities. - Aortic valve: Valve area (VTI): 2 cm^2. Valve area (Vmax): 2.18 cm^2. - Technically adequate study.  Event recorder 05/14/2015: 30 day event recorder. Sinus rhythm and sinus tachycardia noted. Rare PACs and brief atrial runs of 5-10 beats and 10-20 beats - regular and not suggestive of atrial fibrillation. Reported symptoms and fast heart beat and fluttering did not specifically correlate with a specific finding.  Labs/Other Tests and Data Reviewed:    EKG:  An ECG dated 02/10/2018 was personally reviewed today and demonstrated:  Sinus rhythm with small R' in lead V1.  Recent Labs: 10/13/2018: ALT 30; TSH 1.850 04/17/2019: BUN 10; Creatinine, Ser 0.73; Hemoglobin 14.2; Platelets 209; Potassium 3.1; Sodium 137   Recent Lipid Panel Lab Results  Component Value Date/Time   CHOL 165 10/13/2018 09:55 AM   TRIG 100 10/13/2018 09:55 AM   HDL 46 10/13/2018 09:55 AM   CHOLHDL 3.6 10/13/2018 09:55 AM   CHOLHDL 3.3 05/01/2007 04:20 AM   LDLCALC 99 10/13/2018 09:55 AM    Wt  Readings from Last 3 Encounters:  04/29/19 173 lb (78.5 kg)  04/17/19 180 lb (81.6 kg)  10/13/18 179 lb 4.8 oz (81.3 kg)     Objective:    Vital Signs:  Pulse (!) 111   Ht 5\' 3"  (1.6 m)   Wt 173 lb (78.5 kg)   SpO2 96%   BMI 30.65 kg/m    While speaking with the patient she rechecked her heart rate at rest, noted to be in the 90s. Also reported oxygen saturation 97% on room air. Patient spoke in full sentences, not obviously short of breath. No audible wheezing or coughing. Speech pattern normal.  ASSESSMENT & PLAN:    1.  Orthostatic fatigue and elevated heart rate, new problem noted in the setting of diagnosis of COVID-19 and recent pneumonia.  She states that she has generally done better in terms of respiratory symptoms, but subsequently developed the feelings of orthostasis.  Her weight is down 7 pounds  from January, there could be a component of dehydration.  There have been some reported instances of autonomic dysfunction similar to POTS with COVID-19.  We are arranging for her to get a blood pressure cuff at home as this information would be useful in making further determinations.  She will hydrate more effectively, I also mentioned getting some over-the-counter compression stockings to see if this would help at least in the short-term.  We will arrange an office follow-up visit for reevaluation in the next few weeks.  2.  History of PSVT.  It does not sound like she has had any sustained episodes based on discussion of her recent heart rate measurements. She does not report any rapid palpitations and has had no syncope.  We will continue observation for now.   Time:   Today, I have spent 14 minutes with the patient with telehealth technology discussing the above problems.     Medication Adjustments/Labs and Tests Ordered: Current medicines are reviewed at length with the patient today.  Concerns regarding medicines are outlined above.   Tests Ordered: No orders of the  defined types were placed in this encounter.   Medication Changes: No orders of the defined types were placed in this encounter.   Follow Up:  In Person 3 weeks in the [place] office.  Signed, Rozann Lesches, MD  04/29/2019 9:18 AM    [place]

## 2019-04-29 ENCOUNTER — Encounter: Payer: Self-pay | Admitting: Cardiology

## 2019-04-29 ENCOUNTER — Telehealth (INDEPENDENT_AMBULATORY_CARE_PROVIDER_SITE_OTHER): Payer: BC Managed Care – PPO | Admitting: Cardiology

## 2019-04-29 ENCOUNTER — Telehealth: Payer: Self-pay | Admitting: Licensed Clinical Social Worker

## 2019-04-29 VITALS — HR 111 | Ht 63.0 in | Wt 173.0 lb

## 2019-04-29 DIAGNOSIS — G909 Disorder of the autonomic nervous system, unspecified: Secondary | ICD-10-CM | POA: Diagnosis not present

## 2019-04-29 DIAGNOSIS — I471 Supraventricular tachycardia: Secondary | ICD-10-CM | POA: Diagnosis not present

## 2019-04-29 DIAGNOSIS — I951 Orthostatic hypotension: Secondary | ICD-10-CM

## 2019-04-29 NOTE — Patient Instructions (Addendum)
Medication Instructions:  Your physician recommends that you continue on your current medications as directed. Please refer to the Current Medication list given to you today.  *If you need a refill on your cardiac medications before your next appointment, please call your pharmacy*  Lab Work: None today If you have labs (blood work) drawn today and your tests are completely normal, you will receive your results only by: Marland Kitchen MyChart Message (if you have MyChart) OR . A paper copy in the mail If you have any lab test that is abnormal or we need to change your treatment, we will call you to review the results.  Testing/Procedures: None today  Follow-Up: At [facility], you and your health needs are our priority.  As part of our continuing mission to provide you with exceptional heart care, we have created designated Provider Care Teams.  These Care Teams include your primary Cardiologist (physician) and Advanced Practice Providers (APPs -  Physician Assistants and Nurse Practitioners) who all work together to provide you with the care you need, when you need it.  Your next appointment:   2-3 week(s)  The format for your next appointment:   In Person  Provider:   Bernerd Pho, PA-C  Other Instructions Please stay hydrated  Try over the counter compression knee-high's for leg swelling   Thank you for choosing [facility] !

## 2019-04-29 NOTE — Telephone Encounter (Signed)
CSW referred to assist patient with obtaining a BP cuff. CSW contacted patient to inform cuff will be delivered to home. Patient grateful for support and assistance. CSW available as needed. Jackie Yerlin Gasparyan, LCSW, CCSW-MCS [phone]  

## 2019-05-04 ENCOUNTER — Other Ambulatory Visit: Payer: Self-pay | Admitting: Gastroenterology

## 2019-05-04 DIAGNOSIS — K219 Gastro-esophageal reflux disease without esophagitis: Secondary | ICD-10-CM

## 2019-05-04 DIAGNOSIS — G8929 Other chronic pain: Secondary | ICD-10-CM

## 2019-05-04 NOTE — Telephone Encounter (Signed)
I have refilled medication but needs office visit prior to any additional ones.

## 2019-05-05 ENCOUNTER — Encounter: Payer: Self-pay | Admitting: Internal Medicine

## 2019-05-05 NOTE — Telephone Encounter (Signed)
SCHEDULED PATIENT FOR APPOINTMENT

## 2019-05-24 ENCOUNTER — Ambulatory Visit: Payer: BC Managed Care – PPO | Admitting: Student

## 2019-05-24 ENCOUNTER — Encounter: Payer: Self-pay | Admitting: Student

## 2019-05-24 VITALS — BP 138/68 | HR 84 | Temp 98.7°F | Ht 63.0 in | Wt 174.0 lb

## 2019-05-24 DIAGNOSIS — I471 Supraventricular tachycardia: Secondary | ICD-10-CM | POA: Diagnosis not present

## 2019-05-24 DIAGNOSIS — Z79899 Other long term (current) drug therapy: Secondary | ICD-10-CM | POA: Diagnosis not present

## 2019-05-24 DIAGNOSIS — R002 Palpitations: Secondary | ICD-10-CM

## 2019-05-24 NOTE — Progress Notes (Signed)
Cardiology Office Note    Date:  05/24/2019   ID:  Ruth Warner, DOB 1955/08/06, MRN [id number]  PCP:  Redmond School, MD  Cardiologist: Rozann Lesches, MD    Chief Complaint  Patient presents with  . Follow-up    palpitations    History of Present Illness:    Ruth Warner is a 64 y.o. female with past medical history of pSVT and GERD who presents to the office today for 72-month follow-up.   She most recently had a telehealth visit with Dr. Domenic Polite on 04/29/2019 and reported having been diagnosed with COVID-19 the previous month and also developed PNA. She had been experiencing palpitations and HR was becoming elevated with standing but only lasting for seconds at a time. Weight had declined by 7 lbs so it was thought her symptoms might be secondary to orthostasis. It was recommended she increase her hydration and follow BP at home with in-office follow-up recommended.  In talking with the patient today, she reports she was minimally active for 5-6 weeks following her COVID diagnosis due to dyspnea and fatigue. Her husband was hospitalized at [facility] for 5 days then returned home. Prior to her COVID diagnosis, she had overall been doing well and experienced minimal palpitations. At the time of her last visit, her symptoms were more intense and she reports her HR was increasing into the 130's with minimal activity. This did improve with increased hydration but is still occurring. Palpitations can occur at rest or with positional changes. No associated lightheadedness, dizziness or syncope. She has experienced worsening dyspnea but says this is starting to improve. Denies any orthopnea, PND or edema. She does have an iWatch and monitors her HR throughout the day. She has been taking ASA 325mg  daily per her PCP's recommendations given COVID.     Past Medical History:  Diagnosis Date  . Abdominal pain, chronic, epigastric   . Chronic headaches   . GERD (gastroesophageal reflux disease)   .  History of concussion    Staples, hit a shelf  . IBS (irritable bowel syndrome)   . Normal cardiac stress test    February 2009 and May 2016 ([facility])  . Palpitations   . Pelvic relaxation 2015  . Rectocele 2015  . S/P colonoscopy 2001   Dr. Gala Romney: normal. Repeat 10 years  . S/P endoscopy 2001   Dr. Gala Romney: normal    Past Surgical History:  Procedure Laterality Date  . COLONOSCOPY  07/30/2010   Rourk: Single diminutive hyperplastic polyp in the mid descending colon status post cold biopsy removal  . ESOPHAGOGASTRODUODENOSCOPY N/A 02/12/2015   [id number] EGD  . ESOPHAGOGASTRODUODENOSCOPY ENDOSCOPY     With RMR, unable to remember date.  . TONSILLECTOMY      Current Medications: Outpatient Medications Prior to Visit  Medication Sig Dispense Refill  . Ascorbic Acid (VITAMIN C) 1000 MG tablet Take 1,000 mg by mouth daily. Reported on 05/08/2015    . aspirin 325 MG EC tablet Take 325 mg by mouth daily.    . Cholecalciferol (VITAMIN D) 2000 UNITS tablet Take 2,000 Units by mouth daily. Reported on 05/08/2015    . GARLIC PO Take by mouth daily.    . hydrocortisone-pramoxine (ANALPRAM-HC) 2.5-1 % rectal cream Place 1 application rectally 3 (three) times daily. 30 g 3  . Lactobacillus (PROBIOTIC ACIDOPHILUS PO) Take by mouth.    Marland Kitchen omeprazole (PRILOSEC) 20 MG capsule TAKE 1 CAPSULE BY MOUTH TWICE DAILY BEFORE MEALS. 60 capsule 0  . zinc gluconate 50  MG tablet Take 50 mg by mouth daily.    Marland Kitchen aspirin EC 81 MG tablet Take 324 mg by mouth once as needed for mild pain. Reported on 05/08/2015    . benzonatate (TESSALON) 100 MG capsule Take 2 capsules (200 mg total) by mouth 3 (three) times daily as needed for cough. 30 capsule 0   No facility-administered medications prior to visit.     Allergies:   Cephalexin, Dexilant [dexlansoprazole], and Penicillins   Social History   Socioeconomic History  . Marital status: Married    Spouse name: Not on file  . Number of children: 2  . Years of  education: Not on file  . Highest education level: Not on file  Occupational History  . Occupation: retired    Fish farm manager: [place]: [employer]  Tobacco Use  . Smoking status: Never Smoker  . Smokeless tobacco: Never Used  Substance and Sexual Activity  . Alcohol use: No    Alcohol/week: 0.0 standard drinks  . Drug use: No  . Sexual activity: Yes    Birth control/protection: Post-menopausal, Other-see comments    Comment: vasectomy  Other Topics Concern  . Not on file  Social History Narrative  . Not on file   Social Determinants of Health   Financial Resource Strain:   . Difficulty of Paying Living Expenses: Not on file  Food Insecurity:   . Worried About Charity fundraiser in the Last Year: Not on file  . Ran Out of Food in the Last Year: Not on file  Transportation Needs:   . Lack of Transportation (Medical): Not on file  . Lack of Transportation (Non-Medical): Not on file  Physical Activity:   . Days of Exercise per Week: Not on file  . Minutes of Exercise per Session: Not on file  Stress:   . Feeling of Stress : Not on file  Social Connections:   . Frequency of Communication with Friends and Family: Not on file  . Frequency of Social Gatherings with Friends and Family: Not on file  . Attends Religious Services: Not on file  . Active Member of Clubs or Organizations: Not on file  . Attends Archivist Meetings: Not on file  . Marital Status: Not on file     Family History:  The patient's family history includes Alzheimer's disease in her father; Colon cancer in her paternal grandfather; Diabetes in her father and mother; Heart attack in her father and mother.   Review of Systems:   Please see the history of present illness.     General:  No chills, fever, night sweats or weight changes.  Cardiovascular:  No chest pain, edema, orthopnea, paroxysmal nocturnal dyspnea. Positive for palpitations and dyspnea  on exertion.  Dermatological: No rash, lesions/masses Respiratory: No cough, dyspnea Urologic: No hematuria, dysuria Abdominal:   No nausea, vomiting, diarrhea, bright red blood per rectum, melena, or hematemesis Neurologic:  No visual changes, wkns, changes in mental status. All other systems reviewed and are otherwise negative except as noted above.   Physical Exam:    VS:  BP 138/68   Pulse 84   Temp 98.7 F (37.1 C)   Ht 5\' 3"  (1.6 m)   Wt 174 lb (78.9 kg)   SpO2 99%   BMI 30.82 kg/m    General: Well developed, well nourished,female appearing in no acute distress. Head: Normocephalic, atraumatic, sclera non-icteric.  Neck: No carotid bruits. JVD not elevated.  Lungs: Respirations regular and unlabored, without wheezes or rales.  Heart: Regular rate and rhythm. No S3 or S4.  No murmur, no rubs, or gallops appreciated. Abdomen: Soft, non-tender, non-distended. No obvious abdominal masses. Msk:  Strength and tone appear normal for age. No obvious joint deformities or effusions. Extremities: No clubbing or cyanosis. No lower extremity edema.  Distal pedal pulses are 2+ bilaterally. Neuro: Alert and oriented X 3. Moves all extremities spontaneously. No focal deficits noted. Psych:  Responds to questions appropriately with a normal affect. Skin: No rashes or lesions noted  Wt Readings from Last 3 Encounters:  05/24/19 174 lb (78.9 kg)  04/29/19 173 lb (78.5 kg)  04/17/19 180 lb (81.6 kg)     Studies/Labs Reviewed:   EKG:  EKG is not ordered today.    Recent Labs: 10/13/2018: ALT 30; TSH 1.850 04/17/2019: BUN 10; Creatinine, Ser 0.73; Hemoglobin 14.2; Platelets 209; Potassium 3.1; Sodium 137   Lipid Panel    Component Value Date/Time   CHOL 165 10/13/2018 0955   TRIG 100 10/13/2018 0955   HDL 46 10/13/2018 0955   CHOLHDL 3.6 10/13/2018 0955   CHOLHDL 3.3 05/01/2007 0420   VLDL 6 05/01/2007 0420   LDLCALC 99 10/13/2018 0955    Additional studies/ records that were  reviewed today include:   Echocardiogram: 04/2015 Study Conclusions   - Left ventricle: The cavity size was normal. Wall thickness was  normal. Systolic function was vigorous. The estimated ejection  fraction was in the range of 65% to 70%. Wall motion was normal;  there were no regional wall motion abnormalities.  - Aortic valve: Valve area (VTI): 2 cm^2. Valve area (Vmax): 2.18  cm^2.  - Technically adequate study.   Event Monitor: 04/2015 30 day event recorder. Sinus rhythm and sinus tachycardia noted. Rare PACs and brief atrial runs of 5-10 beats and 10-20 beats - regular and not suggestive of atrial fibrillation. Reported symptoms and fast heart beat and fluttering did not specifically correlate with a specific finding.  Assessment:    1. Palpitations   2. PSVT (paroxysmal supraventricular tachycardia) ([place])   3. Medication management      Plan:   In order of problems listed above:  1. Palpitations/History of pSVT - she has a history of SVT and developed worsening palpitations following her recent COVID-19 diagnosis. I checked orthostatics today and BP dropped from 138/68 to 124/64 with no significant change in pulse so not diagnostic of orthostasis but I did recommend she increase her hydration. No significant change in HR so POTS less likely.  - given her symptoms acutely worsened following COVID-19, will obtain a follow-up echocardiogram to ensure no significant structural abnormalities. Will obtain routine labs to include CBC, BMET and TSH as she has not had recent labs with her PCP. Would hold off on medical therapy for now given gradual improvement in symptoms. We reviewed ways to gradually increase her activity at home as I suspect deconditioning due to her recent illness is playing a role as well, but I encouraged her to wait until her echo has resulted prior to resuming routine exercise.     Medication Adjustments/Labs and Tests Ordered: Current medicines are  reviewed at length with the patient today.  Concerns regarding medicines are outlined above.  Medication changes, Labs and Tests ordered today are listed in the Patient Instructions below. Patient Instructions  Medication Instructions:  Your physician recommends that you continue on your current medications as directed. Please refer to the Current Medication list  given to you today.  *If you need a refill on your cardiac medications before your next appointment, please call your pharmacy*   Lab Work: Your physician recommends that you return for lab work in: The Day of your Echo.   If you have labs (blood work) drawn today and your tests are completely normal, you will receive your results only by: Marland Kitchen MyChart Message (if you have MyChart) OR . A paper copy in the mail If you have any lab test that is abnormal or we need to change your treatment, we will call you to review the results.   Testing/Procedures: Your physician has requested that you have an echocardiogram. Echocardiography is a painless test that uses sound waves to create images of your heart. It provides your doctor with information about the size and shape of your heart and how well your heart's chambers and valves are working. This procedure takes approximately one hour. There are no restrictions for this procedure.     Follow-Up: At [facility], you and your health needs are our priority.  As part of our continuing mission to provide you with exceptional heart care, we have created designated Provider Care Teams.  These Care Teams include your primary Cardiologist (physician) and Advanced Practice Providers (APPs -  Physician Assistants and Nurse Practitioners) who all work together to provide you with the care you need, when you need it.  We recommend signing up for the patient portal called "MyChart".  Sign up information is provided on this After Visit Summary.  MyChart is used to connect with patients for Virtual  Visits (Telemedicine).  Patients are able to view lab/test results, encounter notes, upcoming appointments, etc.  Non-urgent messages can be sent to your provider as well.   To learn more about what you can do with MyChart, go to [email or web address].    Your next appointment:   1 year(s)  The format for your next appointment:   In Person  Provider:   Rozann Lesches, MD   Other Instructions Thank you for choosing [facility]!       Signed, Erma Heritage, PA-C  05/24/2019 7:09 PM    [place] [address] [place], [place] [address] Phone: [phone] Fax: [phone]

## 2019-05-24 NOTE — Patient Instructions (Signed)
Medication Instructions:  Your physician recommends that you continue on your current medications as directed. Please refer to the Current Medication list given to you today.  *If you need a refill on your cardiac medications before your next appointment, please call your pharmacy*   Lab Work: Your physician recommends that you return for lab work in: The Day of your Echo.   If you have labs (blood work) drawn today and your tests are completely normal, you will receive your results only by: Marland Kitchen MyChart Message (if you have MyChart) OR . A paper copy in the mail If you have any lab test that is abnormal or we need to change your treatment, we will call you to review the results.   Testing/Procedures: Your physician has requested that you have an echocardiogram. Echocardiography is a painless test that uses sound waves to create images of your heart. It provides your doctor with information about the size and shape of your heart and how well your heart's chambers and valves are working. This procedure takes approximately one hour. There are no restrictions for this procedure.     Follow-Up: At [facility], you and your health needs are our priority.  As part of our continuing mission to provide you with exceptional heart care, we have created designated Provider Care Teams.  These Care Teams include your primary Cardiologist (physician) and Advanced Practice Providers (APPs -  Physician Assistants and Nurse Practitioners) who all work together to provide you with the care you need, when you need it.  We recommend signing up for the patient portal called "MyChart".  Sign up information is provided on this After Visit Summary.  MyChart is used to connect with patients for Virtual Visits (Telemedicine).  Patients are able to view lab/test results, encounter notes, upcoming appointments, etc.  Non-urgent messages can be sent to your provider as well.   To learn more about what you can do with  MyChart, go to [email or web address].    Your next appointment:   1 year(s)  The format for your next appointment:   In Person  Provider:   Rozann Lesches, MD   Other Instructions Thank you for choosing [facility]!

## 2019-05-30 ENCOUNTER — Other Ambulatory Visit (HOSPITAL_COMMUNITY)
Admission: RE | Admit: 2019-05-30 | Discharge: 2019-05-30 | Disposition: A | Discharge status: Home / Self Care | Payer: BC Managed Care – PPO | Source: Ambulatory Visit | Attending: Student | Admitting: Student

## 2019-05-30 ENCOUNTER — Ambulatory Visit (HOSPITAL_COMMUNITY)
Admission: RE | Admit: 2019-05-30 | Discharge: 2019-05-30 | Disposition: A | Discharge status: Home / Self Care | Payer: BC Managed Care – PPO | Source: Ambulatory Visit | Attending: Student | Admitting: Student

## 2019-05-30 ENCOUNTER — Other Ambulatory Visit: Payer: Self-pay

## 2019-05-30 DIAGNOSIS — R002 Palpitations: Secondary | ICD-10-CM

## 2019-05-30 LAB — CBC
HCT: 44 % (ref 36.0–46.0)
Hemoglobin: 14.2 g/dL (ref 12.0–15.0)
MCH: 30.4 pg (ref 26.0–34.0)
MCHC: 32.3 g/dL (ref 30.0–36.0)
MCV: 94.2 fL (ref 80.0–100.0)
Platelets: 208 10*3/uL (ref 150–400)
RBC: 4.67 MIL/uL (ref 3.87–5.11)
RDW: 13.7 % (ref 11.5–15.5)
WBC: 5.2 10*3/uL (ref 4.0–10.5)
nRBC: 0 % (ref 0.0–0.2)

## 2019-05-30 LAB — BASIC METABOLIC PANEL
Anion gap: 8 (ref 5–15)
BUN: 11 mg/dL (ref 8–23)
CO2: 29 mmol/L (ref 22–32)
Calcium: 9.3 mg/dL (ref 8.9–10.3)
Chloride: 104 mmol/L (ref 98–111)
Creatinine, Ser: 0.65 mg/dL (ref 0.44–1.00)
GFR calc Af Amer: >60 mL/min (ref >60)
GFR calc non Af Amer: >60 mL/min (ref >60)
Glucose, Bld: 110 mg/dL — ABNORMAL HIGH (ref 70–99)
Potassium: 4.2 mmol/L (ref 3.5–5.1)
Sodium: 141 mmol/L (ref 135–145)

## 2019-05-30 LAB — TSH: TSH: 1.599 u[IU]/mL (ref 0.350–4.500)

## 2019-05-30 NOTE — Progress Notes (Signed)
*  PRELIMINARY RESULTS* Echocardiogram 2D Echocardiogram has been performed.  Ruth Warner 05/30/2019, 10:22 AM

## 2019-06-15 ENCOUNTER — Ambulatory Visit: Payer: BC Managed Care – PPO | Admitting: Gastroenterology

## 2019-06-15 ENCOUNTER — Encounter: Payer: Self-pay | Admitting: Gastroenterology

## 2019-06-15 ENCOUNTER — Other Ambulatory Visit: Payer: Self-pay

## 2019-06-15 VITALS — BP 133/76 | HR 73 | Temp 97.1°F | Ht 63.0 in | Wt 176.6 lb

## 2019-06-15 DIAGNOSIS — R1013 Epigastric pain: Secondary | ICD-10-CM | POA: Diagnosis not present

## 2019-06-15 DIAGNOSIS — K589 Irritable bowel syndrome without diarrhea: Secondary | ICD-10-CM | POA: Diagnosis not present

## 2019-06-15 DIAGNOSIS — K219 Gastro-esophageal reflux disease without esophagitis: Secondary | ICD-10-CM

## 2019-06-15 DIAGNOSIS — G8929 Other chronic pain: Secondary | ICD-10-CM

## 2019-06-15 MED ORDER — OMEPRAZOLE 20 MG PO CPDR: DELAYED_RELEASE_CAPSULE | ORAL | 3 refills | Status: DC

## 2019-06-15 NOTE — Patient Instructions (Signed)
We will see you back in 2 years. Please call if any changes in your symptoms. You can continue Prilosec twice a day for now. If worsening or frequent pain, please call.  We will triage you for a colonoscopy next year!  I am glad you are doing better!   It was a pleasure to see you today. I want to create trusting relationships with patients to provide genuine, compassionate, and quality care. I value your feedback. If you receive a survey regarding your visit,  I greatly appreciate you taking time to fill this out.   Annitta Needs, PhD, ANP-BC [facility]

## 2019-06-15 NOTE — Progress Notes (Signed)
Referring Provider: Redmond School, MD Primary Care Physician:  Redmond School, MD Primary GI: Dr. Gala Romney   Chief Complaint  Patient presents with  . Gastroesophageal Reflux  . Irritable Bowel Syndrome    diarrhea    HPI:   Ruth Warner is a 64 y.o. female presenting today with a history of GERD and IBS. Due for colonoscopy in 2022.   Had [place] in Jan 2021. Main symptoms were abdominal pain, diarrhea. Developed pneumonia 10 days in. Recovering well.   BM every day. Will have episodes of cramping, looser stool, then feel better. Will happen once a week. Most of the time occurs after breakfast. Chronic. Seldom has constipation and relieved within 24 hours. Years ago would take an anti-spasmodic. Now symptoms are so quick that she is over the episode before needing anything.   Prilosec BID. Thought about weaning down to once per day but since Covid has had worsening reflux. Would like to stay on twice a day for right now. Lost weight during Covid now gaining back. No dysphagia.   Sometimes has pain in epigastric area but not related to bowel movements. No postprandial pain. No significant nausea. Pain off and on after Covid. Improved now. Very seldom NSAIDs. Only tylenol. Feels like probiotic is helping a lot. Epigastric pain with Covid but much improved.    Past Medical History:  Diagnosis Date  . Abdominal pain, chronic, epigastric   . Chronic headaches   . GERD (gastroesophageal reflux disease)   . History of concussion    Staples, hit a shelf  . IBS (irritable bowel syndrome)   . Normal cardiac stress test    February 2009 and May 2016 ([facility])  . Palpitations   . Pelvic relaxation 2015  . Rectocele 2015  . S/P colonoscopy 2001   Dr. Gala Romney: normal. Repeat 10 years  . S/P endoscopy 2001   Dr. Gala Romney: normal    Past Surgical History:  Procedure Laterality Date  . COLONOSCOPY  07/30/2010   Rourk: Single diminutive hyperplastic polyp in the mid descending colon  status post cold biopsy removal  . ESOPHAGOGASTRODUODENOSCOPY N/A 02/12/2015   [id number] EGD  . ESOPHAGOGASTRODUODENOSCOPY ENDOSCOPY     With RMR, unable to remember date.  . TONSILLECTOMY      Current Outpatient Medications  Medication Sig Dispense Refill  . Ascorbic Acid (VITAMIN C) 1000 MG tablet Take 1,000 mg by mouth daily. Reported on 05/08/2015    . aspirin EC 81 MG tablet Take 81 mg by mouth daily.    . Cholecalciferol (VITAMIN D) 2000 UNITS tablet Take 2,000 Units by mouth daily. Reported on 05/08/2015    . GARLIC PO Take by mouth daily.    . hydrocortisone-pramoxine (ANALPRAM-HC) 2.5-1 % rectal cream Place 1 application rectally 3 (three) times daily. (Patient taking differently: Place 1 application rectally as needed. ) 30 g 3  . Lactobacillus (PROBIOTIC ACIDOPHILUS PO) Take by mouth.    . Omega-3 Fatty Acids (FISH OIL) 500 MG CAPS Take by mouth daily.    Marland Kitchen omeprazole (PRILOSEC) 20 MG capsule Take 30 minutes before breakfast 90 capsule 3  . zinc gluconate 50 MG tablet Take 50 mg by mouth daily.     No current facility-administered medications for this visit.    Allergies as of 06/15/2019 - Review Complete 06/15/2019  Allergen Reaction Noted  . Cephalexin Hives 04/19/2015  . Dexilant [dexlansoprazole]  03/08/2018  . Penicillins      Family History  Problem Relation Age of Onset  .  Colon cancer Paternal Grandfather   . Heart attack Mother   . Diabetes Mother   . Diabetes Father   . Heart attack Father   . Alzheimer's disease Father   . Colon polyps Father        not pre-cancerous     Social History   Socioeconomic History  . Marital status: Married    Spouse name: Not on file  . Number of children: 2  . Years of education: Not on file  . Highest education level: Not on file  Occupational History  . 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 Smoking status: Never Smoker  .  Smokeless tobacco: Never Used  Substance and Sexual Activity  . Alcohol use: No    Alcohol/week: 0.0 standard drinks  . Drug use: No  . Sexual activity: Yes    Birth control/protection: Post-menopausal, Other-see comments    Comment: vasectomy  Other Topics Concern  . Not on file  Social History Narrative  . Not on file   Social Determinants of Health   Financial Resource Strain:   . Difficulty of Paying Living Expenses:   Food Insecurity:   . Worried About Charity fundraiser in the Last Year:   . Arboriculturist in the Last Year:   Transportation Needs:   . Film/video editor (Medical):   Marland Kitchen Lack of Transportation (Non-Medical):   Physical Activity:   . Days of Exercise per Week:   . Minutes of Exercise per Session:   Stress:   . Feeling of Stress :   Social Connections:   . Frequency of Communication with Friends and Family:   . Frequency of Social Gatherings with Friends and Family:   . Attends Religious Services:   . Active Member of Clubs or Organizations:   . Attends Archivist Meetings:   Marland Kitchen Marital Status:     Review of Systems: Gen: Denies fever, chills, anorexia. Denies fatigue, weakness, weight loss.  CV: Denies chest pain, palpitations, syncope, peripheral edema, and claudication. Resp: Denies dyspnea at rest, cough, wheezing, coughing up blood, and pleurisy. GI: see HPI Derm: Denies rash, itching, dry skin Psych: Denies depression, anxiety, memory loss, confusion. No homicidal or suicidal ideation.  Heme: Denies bruising, bleeding, and enlarged lymph nodes.  Physical Exam: BP 133/76   Pulse 73   Temp (!) 97.1 F (36.2 C) (Temporal)   Ht 5\' 3"  (1.6 m)   Wt 176 lb 9.6 oz (80.1 kg)   BMI 31.28 kg/m  General:   Alert and oriented. No distress noted. Pleasant and cooperative.  Head:  Normocephalic and atraumatic. Eyes:  Conjuctiva clear without scleral icterus. Mouth:  Mask in place Lungs: Clear bilaterally Cardiac: S1 S2 present, soft  systolic murmur  Abdomen:  +BS, soft, non-tender and non-distended. No rebound or guarding. No HSM or masses noted. Msk:  Symmetrical without gross deformities. Normal posture. Extremities:  Without edema. Neurologic:  Alert and  oriented x4 Psych:  Alert and cooperative. Normal mood and affect.  ASSESSMENT: Ruth Warner is a 64 y.o. female presenting today with a history of GERD, IBS, and recently recovering from [place] in Jan 2021.   She notes severe diarrhea during her time with COVID, now improved and back to baseline. Vague epigastric discomfort also at time of COVID also improved significantly. She has no concerning lower or upper GI signs/symptoms at this time, doing well  with Prilosec BID.   Screening colonoscopy due in 2022.    PLAN:   Refilled Prilosec for BID X 1 year. We discussed weaning down to once per day as tolerated.  Screening colonoscopy 2022: we will triage for this  Return in 2 years or sooner if needed   Annitta Needs, PhD, [facility]

## 2019-12-09 IMAGING — US US BREAST*R* LIMITED INC AXILLA
1 series · 5 of 5 positions shown · non-contrast
Comparison: Previous exam(s).

CLINICAL DATA: Screening recall for possible right breast mass.

EXAM:
DIGITAL DIAGNOSTIC UNILATERAL RIGHT MAMMOGRAM WITH CAD AND TOMO
RIGHT BREAST ULTRASOUND

[Series 1: us breast*right* limited inc axilla · 0.07mm/px · 5 of 5 slices shown]
[im 1/5]
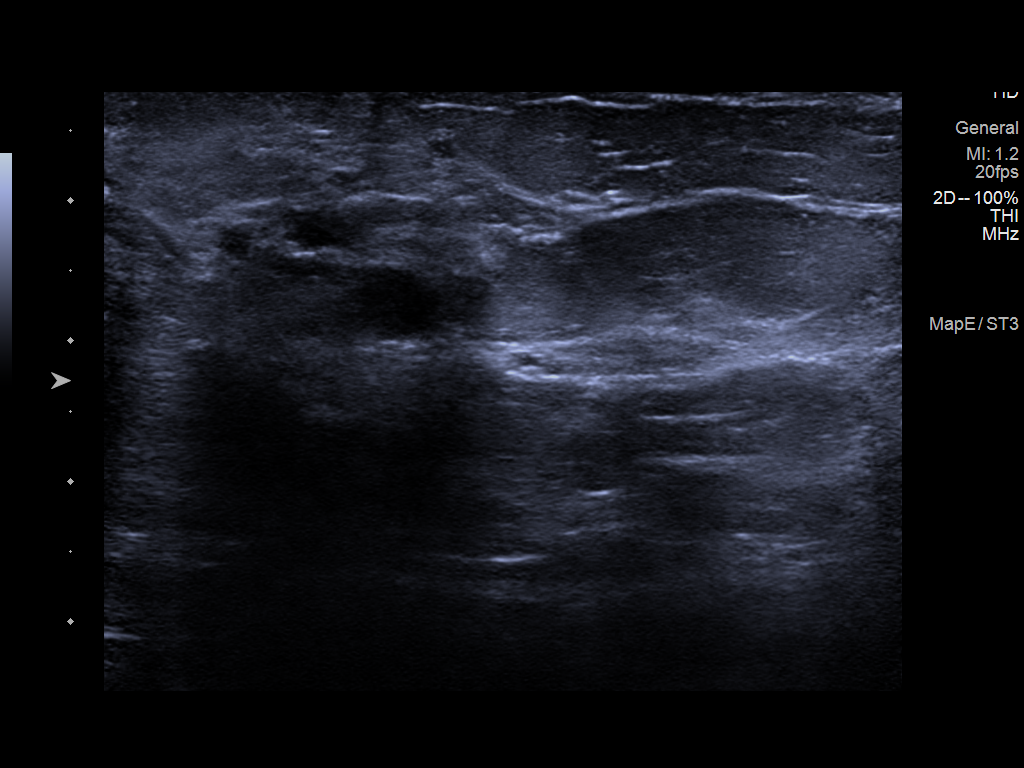
[im 2/5]
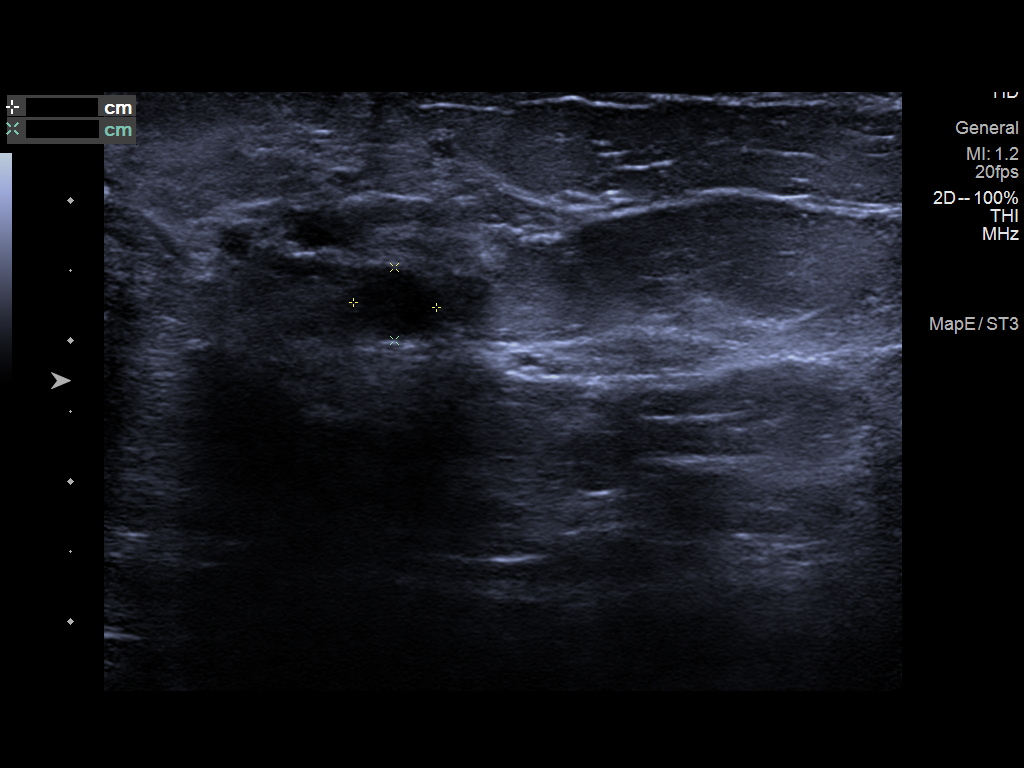
[im 3/5]
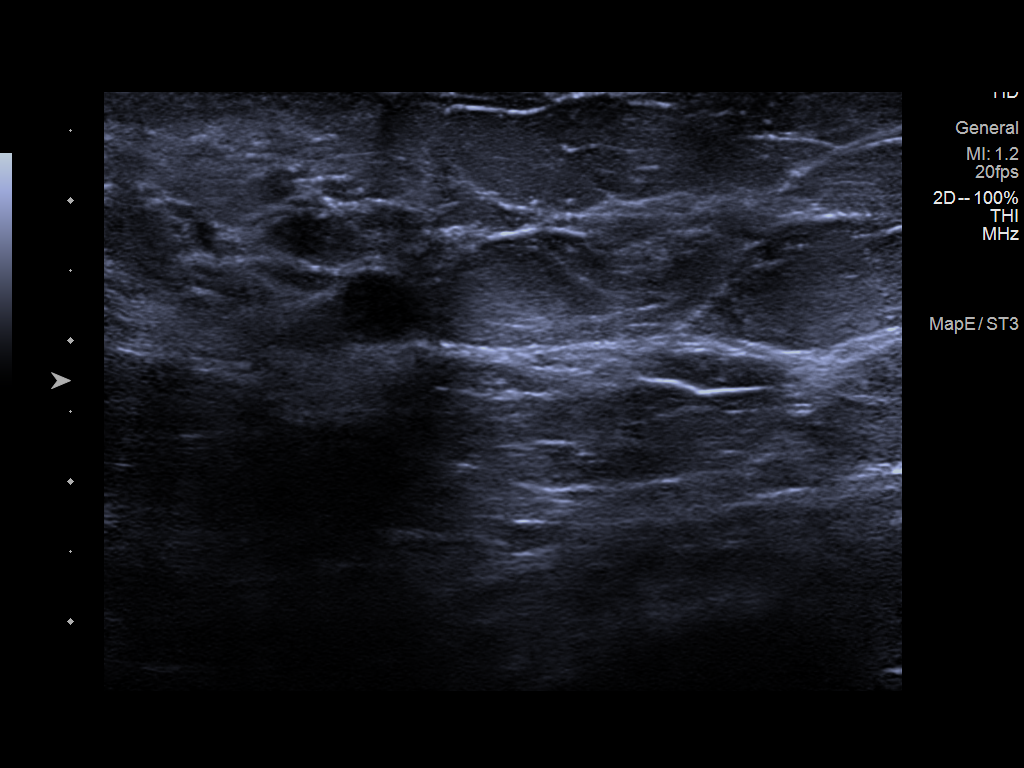
[im 4/5]
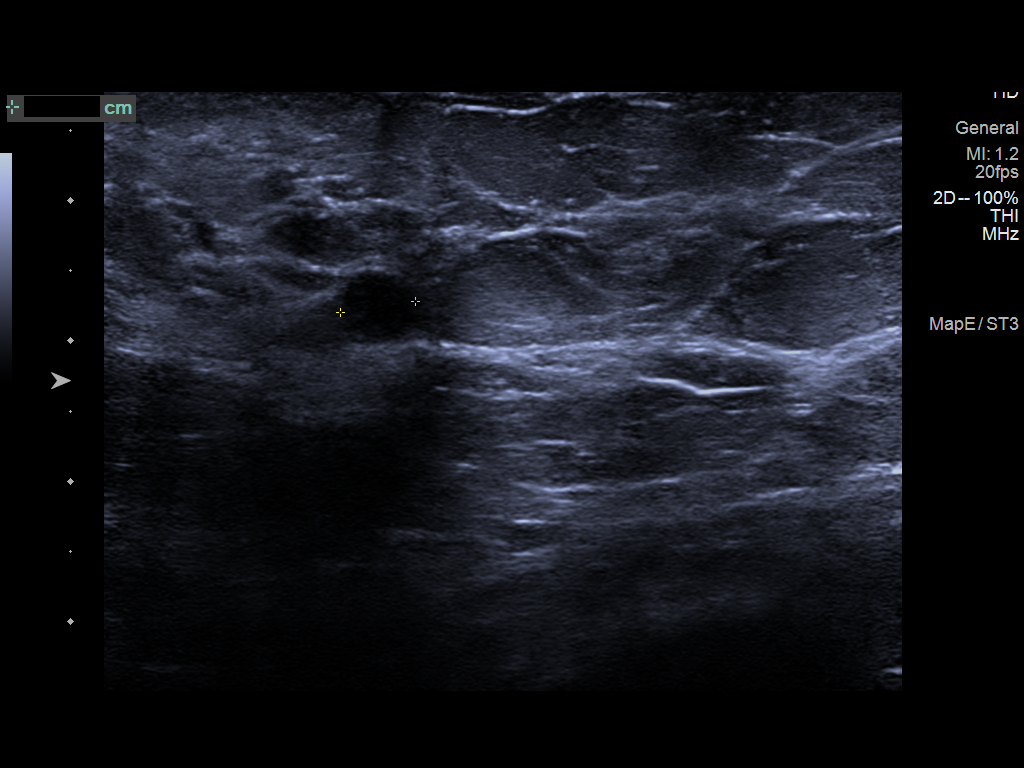
[im 5/5]
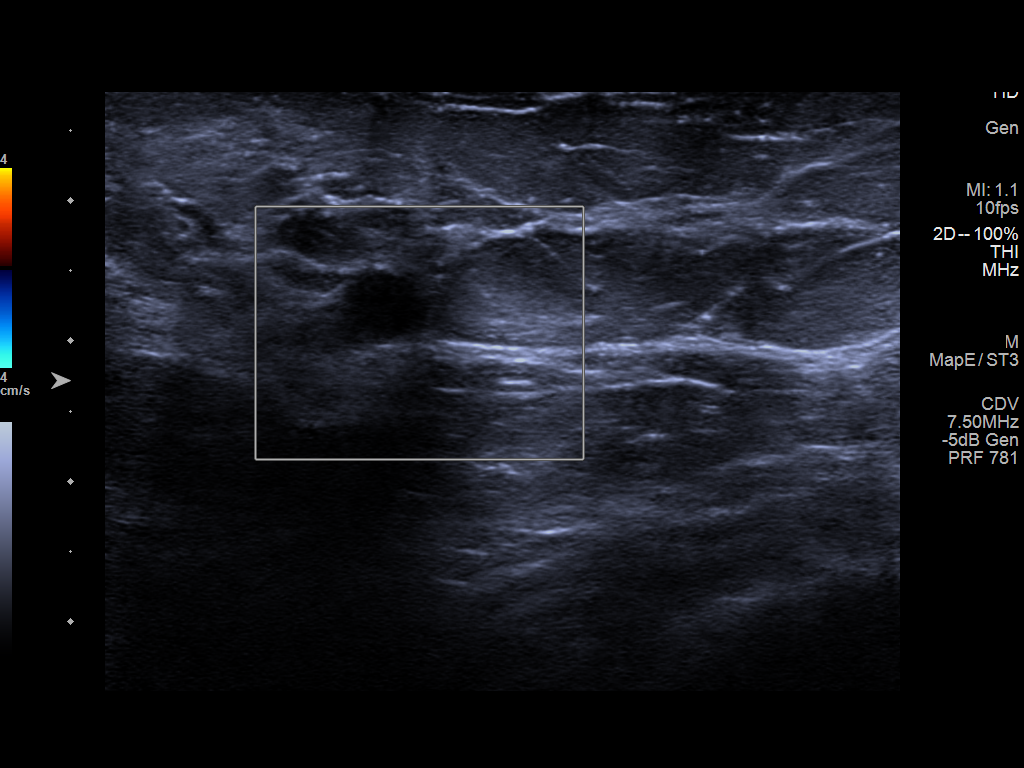

[5 of 5 positions shown; findings below may reference images not displayed]

ACR Breast Density Category b: There are scattered areas of
fibroglandular density.
FINDINGS: Spot compression tomograms were performed of the right breast. The
initially questioned possible right breast mass appears to resolve
on the additional imaging with findings suggestive of an area of
overlapping fibroglandular tissue.

Mammographic images were processed with CAD.

Targeted ultrasound of the lower and retroareolar right breast was
performed. There is a cyst at 4 o'clock 1 cm from the nipple
measuring 0.5 x 0.5 x 0.6 cm. No suspicious masses or abnormality
seen.
IMPRESSION: No findings of malignancy in the right breast.

RECOMMENDATION:
Screening mammogram in one year.(Code:2T-P-HC7)

I have discussed the findings and recommendations with the patient.
Results were also provided in writing at the conclusion of the
visit. If applicable, a reminder letter will be sent to the patient
regarding the next appointment.

BI-RADS CATEGORY  2: Benign.

## 2019-12-27 ENCOUNTER — Other Ambulatory Visit (HOSPITAL_COMMUNITY): Payer: Self-pay | Admitting: Obstetrics & Gynecology

## 2019-12-27 DIAGNOSIS — Z1231 Encounter for screening mammogram for malignant neoplasm of breast: Secondary | ICD-10-CM

## 2020-01-02 ENCOUNTER — Ambulatory Visit (HOSPITAL_COMMUNITY)
Admission: RE | Admit: 2020-01-02 | Discharge: 2020-01-02 | Disposition: A | Discharge status: Home / Self Care | Payer: PPO | Source: Ambulatory Visit | Attending: Obstetrics & Gynecology | Admitting: Obstetrics & Gynecology

## 2020-01-02 ENCOUNTER — Other Ambulatory Visit: Payer: Self-pay

## 2020-01-02 DIAGNOSIS — Z1231 Encounter for screening mammogram for malignant neoplasm of breast: Secondary | ICD-10-CM | POA: Diagnosis not present

## 2020-01-04 ENCOUNTER — Other Ambulatory Visit (HOSPITAL_COMMUNITY): Payer: Self-pay | Admitting: Obstetrics & Gynecology

## 2020-01-04 DIAGNOSIS — R928 Other abnormal and inconclusive findings on diagnostic imaging of breast: Secondary | ICD-10-CM

## 2020-01-10 ENCOUNTER — Ambulatory Visit (HOSPITAL_COMMUNITY)
Admission: RE | Admit: 2020-01-10 | Discharge: 2020-01-10 | Disposition: A | Discharge status: Home / Self Care | Payer: PPO | Source: Ambulatory Visit | Attending: Obstetrics & Gynecology | Admitting: Obstetrics & Gynecology

## 2020-01-10 ENCOUNTER — Other Ambulatory Visit: Payer: Self-pay

## 2020-01-10 DIAGNOSIS — R928 Other abnormal and inconclusive findings on diagnostic imaging of breast: Secondary | ICD-10-CM | POA: Diagnosis not present

## 2020-01-10 DIAGNOSIS — R922 Inconclusive mammogram: Secondary | ICD-10-CM | POA: Diagnosis not present

## 2020-01-12 DIAGNOSIS — L271 Localized skin eruption due to drugs and medicaments taken internally: Secondary | ICD-10-CM | POA: Diagnosis not present

## 2020-01-12 DIAGNOSIS — D225 Melanocytic nevi of trunk: Secondary | ICD-10-CM | POA: Diagnosis not present

## 2020-01-12 DIAGNOSIS — Z1283 Encounter for screening for malignant neoplasm of skin: Secondary | ICD-10-CM | POA: Diagnosis not present

## 2020-01-12 DIAGNOSIS — L820 Inflamed seborrheic keratosis: Secondary | ICD-10-CM | POA: Diagnosis not present

## 2020-01-31 ENCOUNTER — Other Ambulatory Visit: Payer: BC Managed Care – PPO | Admitting: Adult Health

## 2020-01-31 DIAGNOSIS — M79672 Pain in left foot: Secondary | ICD-10-CM | POA: Diagnosis not present

## 2020-01-31 DIAGNOSIS — M722 Plantar fascial fibromatosis: Secondary | ICD-10-CM | POA: Diagnosis not present

## 2020-04-27 DIAGNOSIS — Z20828 Contact with and (suspected) exposure to other viral communicable diseases: Secondary | ICD-10-CM | POA: Diagnosis not present

## 2020-05-16 ENCOUNTER — Other Ambulatory Visit: Payer: Self-pay | Admitting: Gastroenterology

## 2020-05-16 DIAGNOSIS — K219 Gastro-esophageal reflux disease without esophagitis: Secondary | ICD-10-CM

## 2020-05-16 DIAGNOSIS — G8929 Other chronic pain: Secondary | ICD-10-CM

## 2020-05-29 DIAGNOSIS — K589 Irritable bowel syndrome without diarrhea: Secondary | ICD-10-CM | POA: Diagnosis not present

## 2020-05-29 DIAGNOSIS — E7849 Other hyperlipidemia: Secondary | ICD-10-CM | POA: Diagnosis not present

## 2020-05-29 DIAGNOSIS — K219 Gastro-esophageal reflux disease without esophagitis: Secondary | ICD-10-CM | POA: Diagnosis not present

## 2020-05-29 DIAGNOSIS — Z1389 Encounter for screening for other disorder: Secondary | ICD-10-CM | POA: Diagnosis not present

## 2020-05-29 DIAGNOSIS — Z0000 Encounter for general adult medical examination without abnormal findings: Secondary | ICD-10-CM | POA: Diagnosis not present

## 2020-05-29 DIAGNOSIS — R7309 Other abnormal glucose: Secondary | ICD-10-CM | POA: Diagnosis not present

## 2020-05-29 DIAGNOSIS — Z6830 Body mass index (BMI) 30.0-30.9, adult: Secondary | ICD-10-CM | POA: Diagnosis not present

## 2020-05-31 ENCOUNTER — Other Ambulatory Visit: Payer: Self-pay

## 2020-05-31 ENCOUNTER — Other Ambulatory Visit (HOSPITAL_COMMUNITY)
Admission: RE | Admit: 2020-05-31 | Discharge: 2020-05-31 | Disposition: A | Discharge status: Home / Self Care | Payer: PPO | Source: Ambulatory Visit | Attending: Adult Health | Admitting: Adult Health

## 2020-05-31 ENCOUNTER — Ambulatory Visit: Payer: PPO | Admitting: Adult Health

## 2020-05-31 ENCOUNTER — Encounter: Payer: Self-pay | Admitting: Adult Health

## 2020-05-31 VITALS — BP 119/77 | HR 74 | Ht 63.0 in | Wt 181.0 lb

## 2020-05-31 DIAGNOSIS — Z1211 Encounter for screening for malignant neoplasm of colon: Secondary | ICD-10-CM | POA: Diagnosis not present

## 2020-05-31 DIAGNOSIS — Z01419 Encounter for gynecological examination (general) (routine) without abnormal findings: Secondary | ICD-10-CM

## 2020-05-31 DIAGNOSIS — K649 Unspecified hemorrhoids: Secondary | ICD-10-CM | POA: Diagnosis not present

## 2020-05-31 DIAGNOSIS — N8189 Other female genital prolapse: Secondary | ICD-10-CM

## 2020-05-31 DIAGNOSIS — N816 Rectocele: Secondary | ICD-10-CM | POA: Diagnosis not present

## 2020-05-31 LAB — HEMOCCULT GUIAC POC 1CARD (OFFICE): Fecal Occult Blood, POC: NEGATIVE

## 2020-05-31 MED ORDER — HYDROCORT-PRAMOXINE (PERIANAL) 2.5-1 % EX CREA: 1.0000 "application " | TOPICAL_CREAM | CUTANEOUS | 3 refills | Status: AC | PRN

## 2020-05-31 NOTE — Progress Notes (Signed)
Patient ID: Ruth Warner, female   DOB: Jul 07, 1955, 65 y.o.   MRN: [id number] History of Present Illness: Ruth Warner is a 65 year old white female,married, PM in for a pap and pelvic exam. Had physical and labs with PCP. PCP is Dr Gerarda Fraction.    Current Medications, Allergies, Past Medical History, Past Surgical History, Family History and Social History were reviewed in [facility] record.     Review of Systems: Patient denies any headaches, hearing loss, fatigue, blurred vision, shortness of breath, chest pain, abdominal pain, problems with bowel movements, urination(occasional dribble), or intercourse. No joint pain or mood swings.    Physical Exam:BP 119/77 (BP Location: Left Arm, Patient Position: Sitting, Cuff Size: Normal)   Pulse 74   Ht 5\' 3"  (1.6 m)   Wt 181 lb (82.1 kg)   BMI 32.06 kg/m  General:  Well developed, well nourished, no acute distress Skin:  Warm and dry Neck:  Midline trachea, normal thyroid, good ROM, no lymphadenopathy,no carotid bruits heard  Lungs; Clear to auscultation bilaterally Cardiovascular: Regular rate and rhythm Pelvic:  External genitalia is normal in appearance, no lesions.  The vagina is pale with fair moisture and loss of rugae.+pelvic relaxtion, mild cystocele. Urethra has no lesions or masses. The cervix is smooth, pap with HRHPV genotyping performed, +bleeding with EC brush.  Uterus is felt to be normal size, shape, and contour.  No adnexal masses or tenderness noted.Bladder is non tender, no masses felt. Rectal: Good sphincter tone, no polyps, + hemorrhoids felt.  Hemoccult negative.+rectocele Extremities/musculoskeletal:  No swelling or varicosities noted, no clubbing or cyanosis Psych:  No mood changes, alert and cooperative,seems happy AA is 0 Fall risk is low PHQ 9 score is 0 GAD 7 score is 0  [facility] - 05/31/20 0854      Pregnancy Intention Screening   Does the patient want to become pregnant in the next year? N/A     Does the patient's partner want to become pregnant in the next year? N/A      Contraception Wrap Up   Current Method Vasectomy   PM   End Method Vasectomy   PM   Contraception Counseling Provided No         Co-exam with Tinnie Gens NP student  Impression and Plan: 1. Encounter for well woman exam with routine gynecological exam Pap in 3 years Pelvic in 1 year Physical with PCP Labs with PCP Mammogram yearly Colonoscopy per GI  2. Pelvic relaxation  3. Encounter for screening fecal occult blood testing  4. Rectocele   5. Hemorrhoids, unspecified hemorrhoid type Will refill Analpram HC Meds ordered this encounter  Medications  . hydrocortisone-pramoxine (ANALPRAM-HC) 2.5-1 % rectal cream    Sig: Place 1 application rectally as needed.    Dispense:  30 g    Refill:  3    Order Specific Question:   Supervising Provider    Answer:   Tania Ade H [[id number]]

## 2020-06-04 LAB — CYTOLOGY - PAP
Comment: NEGATIVE
Diagnosis: NEGATIVE
Diagnosis: REACTIVE
High risk HPV: NEGATIVE

## 2020-06-25 ENCOUNTER — Encounter: Payer: Self-pay | Admitting: Internal Medicine

## 2020-11-23 ENCOUNTER — Other Ambulatory Visit: Payer: Self-pay | Admitting: Gastroenterology

## 2020-11-23 DIAGNOSIS — R1013 Epigastric pain: Secondary | ICD-10-CM

## 2020-11-23 DIAGNOSIS — G8929 Other chronic pain: Secondary | ICD-10-CM

## 2020-11-23 DIAGNOSIS — K219 Gastro-esophageal reflux disease without esophagitis: Secondary | ICD-10-CM

## 2020-11-23 MED ORDER — OMEPRAZOLE 20 MG PO CPDR: DELAYED_RELEASE_CAPSULE | ORAL | 1 refills | Status: DC

## 2021-01-09 IMAGING — DX DG CHEST 1V PORT
1 series · 1 of 1 positions shown · non-contrast
Comparison: 04/15/2019

CLINICAL DATA: Cough, fever, [id number] positive

EXAM:
PORTABLE CHEST 1 VIEW

[chest ap]
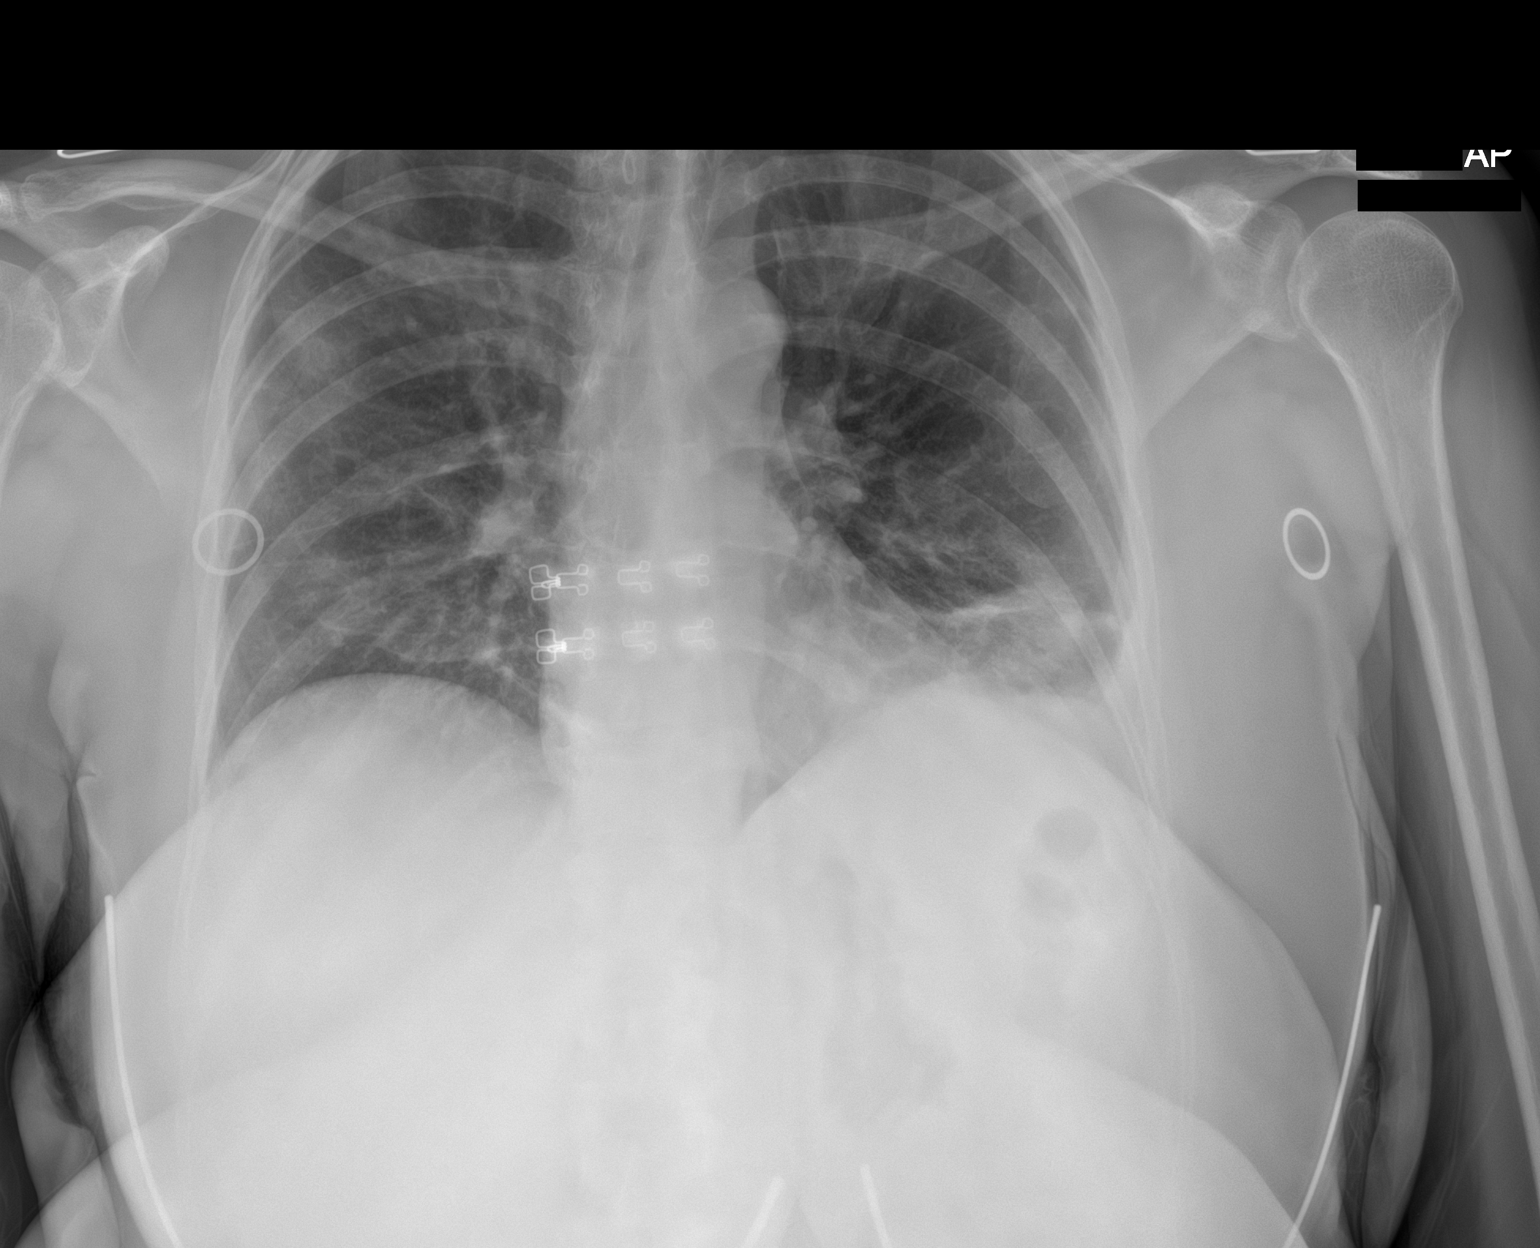

[1 of 1 positions shown; findings below may reference images not displayed]

FINDINGS: There are bilateral interstitial and patchy alveolar airspace
opacities. There is no pleural effusion or pneumothorax. The heart
and mediastinal contours are unremarkable.

There is no acute osseous abnormality.
IMPRESSION: Bilateral interstitial and patchy alveolar airspace opacities
concerning for multilobar pneumonia including atypical viral
pneumonia.

## 2021-01-14 DIAGNOSIS — J069 Acute upper respiratory infection, unspecified: Secondary | ICD-10-CM | POA: Diagnosis not present

## 2021-01-28 ENCOUNTER — Encounter: Payer: Self-pay | Admitting: Gastroenterology

## 2021-01-28 ENCOUNTER — Encounter: Payer: Self-pay | Admitting: *Deleted

## 2021-01-28 ENCOUNTER — Ambulatory Visit: Payer: PPO | Admitting: Gastroenterology

## 2021-01-28 ENCOUNTER — Other Ambulatory Visit: Payer: Self-pay

## 2021-01-28 VITALS — BP 134/79 | HR 88 | Temp 97.3°F | Ht 63.0 in | Wt 180.2 lb

## 2021-01-28 DIAGNOSIS — K580 Irritable bowel syndrome with diarrhea: Secondary | ICD-10-CM | POA: Diagnosis not present

## 2021-01-28 DIAGNOSIS — R1013 Epigastric pain: Secondary | ICD-10-CM | POA: Diagnosis not present

## 2021-01-28 DIAGNOSIS — G8929 Other chronic pain: Secondary | ICD-10-CM | POA: Diagnosis not present

## 2021-01-28 DIAGNOSIS — Z1211 Encounter for screening for malignant neoplasm of colon: Secondary | ICD-10-CM | POA: Diagnosis not present

## 2021-01-28 DIAGNOSIS — K219 Gastro-esophageal reflux disease without esophagitis: Secondary | ICD-10-CM | POA: Diagnosis not present

## 2021-01-28 MED ORDER — PEG 3350-KCL-NA BICARB-NACL 420 G PO SOLR: ORAL | 0 refills | Status: DC

## 2021-01-28 MED ORDER — OMEPRAZOLE 20 MG PO CPDR: DELAYED_RELEASE_CAPSULE | ORAL | 3 refills | Status: DC

## 2021-01-28 NOTE — Patient Instructions (Addendum)
Continue omeprazole 20mg  daily before breakfast.  Try avoiding HIGH FODMAP foods that can cause excessive gas production. You can also add over the counter Gas-X as per package label for when you feel bloated. If gas/bloating does not improve or becomes more frequent, please let me know. Colonoscopy to be scheduled. See separate instructions.   Low-FODMAP Eating Plan FODMAP stands for fermentable oligosaccharides, disaccharides, monosaccharides, and polyols. These are sugars that are hard for some people to digest. A low-FODMAP eating plan may help some people who have irritable bowel syndrome (IBS) and certain other bowel (intestinal) diseases to manage their symptoms. This meal plan can be complicated to follow. Work with a diet and nutrition specialist (dietitian) to make a low-FODMAP eating plan that is right for you. A dietitian can help make sure that you get enough nutrition from this diet. What are tips for following this plan? Reading food labels Check labels for hidden FODMAPs such as: High-fructose syrup. Honey. Agave. Natural fruit flavors. Onion or garlic powder. Choose low-FODMAP foods that contain 3-4 grams of fiber per serving. Check food labels for serving sizes. Eat only one serving at a time to make sure FODMAP levels stay low. Shopping Shop with a list of foods that are recommended on this diet and make a meal plan. Meal planning Follow a low-FODMAP eating plan for up to 6 weeks, or as told by your health care provider or dietitian. To follow the eating plan: Eliminate high-FODMAP foods from your diet completely. Choose only low-FODMAP foods to eat. You will do this for 2-6 weeks. Gradually reintroduce high-FODMAP foods into your diet one at a time. Most people should wait a few days before introducing the next new high-FODMAP food into their meal plan. Your dietitian can recommend how quickly you may reintroduce foods. Keep a daily record of what and how much you eat and  drink. Make note of any symptoms that you have after eating. Review your daily record with a dietitian regularly to identify which foods you can eat and which foods you should avoid. General tips Drink enough fluid each day to keep your urine pale yellow. Avoid processed foods. These often have added sugar and may be high in FODMAPs. Avoid most dairy products, whole grains, and sweeteners. Work with a dietitian to make sure you get enough fiber in your diet. Avoid high FODMAP foods at meals to manage symptoms. Recommended foods Fruits Bananas, oranges, tangerines, lemons, limes, blueberries, raspberries, strawberries, grapes, cantaloupe, honeydew melon, kiwi, papaya, passion fruit, and pineapple. Limited amounts of dried cranberries, banana chips, and shredded coconut. Vegetables Eggplant, zucchini, cucumber, peppers, green beans, bean sprouts, lettuce, arugula, kale, Swiss chard, spinach, collard greens, bok choy, summer squash, potato, and tomato. Limited amounts of corn, carrot, and sweet potato. Green parts of scallions. Grains Gluten-free grains, such as rice, oats, buckwheat, quinoa, corn, polenta, and millet. Gluten-free pasta, bread, or cereal. Rice noodles. Corn tortillas. Meats and other proteins Unseasoned beef, pork, poultry, or fish. Eggs. Berniece Salines. Tofu (firm) and tempeh. Limited amounts of nuts and seeds, such as almonds, walnuts, [place] nuts, pecans, peanuts, nut butters, pumpkin seeds, chia seeds, and sunflower seeds. Dairy Lactose-free milk, yogurt, and kefir. Lactose-free cottage cheese and ice cream. Non-dairy milks, such as almond, coconut, hemp, and rice milk. Non-dairy yogurt. Limited amounts of goat cheese, brie, mozzarella, parmesan, swiss, and other hard cheeses. Fats and oils Butter-free spreads. Vegetable oils, such as olive, canola, and sunflower oil. Seasoning and other foods Artificial sweeteners with names that do  not end in "ol," such as aspartame, saccharine, and  stevia. Maple syrup, white table sugar, raw sugar, brown sugar, and molasses. Mayonnaise, soy sauce, and tamari. Fresh basil, coriander, parsley, rosemary, and thyme. Beverages Water and mineral water. Sugar-sweetened soft drinks. Small amounts of orange juice or cranberry juice. Black and green tea. Most dry wines. Coffee. The items listed above may not be a complete list of foods and beverages you can eat. Contact a dietitian for more information. Foods to avoid Fruits Fresh, dried, and juiced forms of apple, pear, watermelon, peach, plum, cherries, apricots, blackberries, boysenberries, figs, nectarines, and mango. Avocado. Vegetables Chicory root, artichoke, asparagus, cabbage, snow peas, Brussels sprouts, broccoli, sugar snap peas, mushrooms, celery, and cauliflower. Onions, garlic, leeks, and the white part of scallions. Grains Wheat, including kamut, durum, and semolina. Barley and bulgur. Couscous. Wheat-based cereals. Wheat noodles, bread, crackers, and pastries. Meats and other proteins Fried or fatty meat. Sausage. Cashews and pistachios. Soybeans, baked beans, black beans, chickpeas, kidney beans, fava beans, navy beans, lentils, black-eyed peas, and split peas. Dairy Milk, yogurt, ice cream, and soft cheese. Cream and sour cream. Milk-based sauces. Custard. Buttermilk. Soy milk. Seasoning and other foods Any sugar-free gum or candy. Foods that contain artificial sweeteners such as sorbitol, mannitol, isomalt, or xylitol. Foods that contain honey, high-fructose corn syrup, or agave. Bouillon, vegetable stock, beef stock, and chicken stock. Garlic and onion powder. Condiments made with onion, such as hummus, chutney, pickles, relish, salad dressing, and salsa. Tomato paste. Beverages Chicory-based drinks. Coffee substitutes. Chamomile tea. Fennel tea. Sweet or fortified wines such as port or sherry. Diet soft drinks made with isomalt, mannitol, maltitol, sorbitol, or xylitol. Apple,  pear, and mango juice. Juices with high-fructose corn syrup. The items listed above may not be a complete list of foods and beverages you should avoid. Contact a dietitian for more information. Summary FODMAP stands for fermentable oligosaccharides, disaccharides, monosaccharides, and polyols. These are sugars that are hard for some people to digest. A low-FODMAP eating plan is a short-term diet that helps to ease symptoms of certain bowel diseases. The eating plan usually lasts up to 6 weeks. After that, high-FODMAP foods are reintroduced gradually and one at a time. This can help you find out which foods may be causing symptoms. A low-FODMAP eating plan can be complicated. It is best to work with a dietitian who has experience with this type of plan. This information is not intended to replace advice given to you by your health care provider. Make sure you discuss any questions you have with your health care provider. Document Revised: 07/28/2019 Document Reviewed: 07/28/2019 Elsevier Patient Education  [employer].

## 2021-01-28 NOTE — Progress Notes (Signed)
Primary Care Physician: Redmond School, MD  Primary Gastroenterologist:  Garfield Cornea, MD   Chief Complaint  Patient presents with   Gastroesophageal Reflux    Doing okay   Irritable Bowel Syndrome    Changes between diarrhea/constipation, occas abd pain, no nausea, no vomiting    HPI: Ruth Warner is a 65 y.o. female here for follow-up.  Patient last seen in March 2021.  History of GERD and IBS.  Due for screening colonoscopy this year.  Overall doing well. BM at least 2 times per day.  Does have some postprandial loose stools at times but self-limiting.  Rarely has constipation.  No melena or rectal bleeding.  Has noted some increased flatulence and belching.  Takes probiotic every day.  We will switch to different brands periodically.  Sometimes feels bloated especially in the upper abdomen after meals.  Notices it a lot when she eats apples. Take celebrex for flares of plantar fascitiis. Omeprazole every day. Does not tolerate trying to come off.  Previously had negative celiac serologies.  EGD November 2016 unremarkable.  Colonoscopy May 2012 with single hyperplastic polyp in the mid descending colon removed.  Current Outpatient Medications  Medication Sig Dispense Refill   Ascorbic Acid (VITAMIN C) 1000 MG tablet Take 1,000 mg by mouth daily. Reported on 05/08/2015     celecoxib (CELEBREX) 200 MG capsule Take 200 mg by mouth daily. As needed     Cholecalciferol (VITAMIN D) 2000 UNITS tablet Take 2,000 Units by mouth daily. Reported on 05/15/9796     GARLIC PO Take by mouth daily.     hydrocortisone-pramoxine (ANALPRAM-HC) 2.5-1 % rectal cream Place 1 application rectally as needed. 30 g 3   Lactobacillus (PROBIOTIC ACIDOPHILUS PO) Take by mouth. Once daily     Omega-3 Fatty Acids (FISH OIL) 500 MG CAPS Take by mouth daily.     omeprazole (PRILOSEC) 20 MG capsule TAKE 1 CAPSULE BY MOUTH DAILY 30 MINUTES BEFORE BREAKFAST. 90 capsule 1   zinc gluconate 50 MG tablet Take 50  mg by mouth daily. (Patient not taking: Reported on 01/28/2021)     No current facility-administered medications for this visit.    Allergies as of 01/28/2021 - Review Complete 01/28/2021  Allergen Reaction Noted   Cephalexin Hives 04/19/2015   Dexilant [dexlansoprazole]  03/08/2018   Penicillins     Past Medical History:  Diagnosis Date   Abdominal pain, chronic, epigastric    Chronic headaches    GERD (gastroesophageal reflux disease)    History of concussion    [employer], hit a shelf   IBS (irritable bowel syndrome)    Normal cardiac stress test    February 2009 and May 2016 ([facility])   Palpitations    Pelvic relaxation 2015   Rectocele 2015   S/P colonoscopy 2001   Dr. Gala Romney: normal. Repeat 10 years   S/P endoscopy 2001   Dr. Gala Romney: normal   Past Surgical History:  Procedure Laterality Date   COLONOSCOPY  07/30/2010   [place]: Single diminutive hyperplastic polyp in the mid descending colon status post cold biopsy removal   ESOPHAGOGASTRODUODENOSCOPY N/A 02/12/2015   XQJ:JHERDE EGD   ESOPHAGOGASTRODUODENOSCOPY ENDOSCOPY     With [facility], unable to remember date.   TONSILLECTOMY     Family History  Problem Relation Age of Onset   Colon cancer Paternal Grandfather    Heart attack Mother    Diabetes Mother    Diabetes Father    Heart attack Father  Alzheimer's disease Father    Colon polyps Father        not pre-cancerous    Social History   Tobacco Use   Smoking status: Never   Smokeless tobacco: Never  Vaping Use   Vaping Use: Never used  Substance Use Topics   Alcohol use: No    Alcohol/week: 0.0 standard drinks   Drug use: No    ROS:  General: Negative for anorexia, weight loss, fever, chills, fatigue, weakness. ENT: Negative for hoarseness, difficulty swallowing , nasal congestion. CV: Negative for chest pain, angina, palpitations, dyspnea on exertion, peripheral edema.  Respiratory: Negative for dyspnea at rest, dyspnea on exertion, cough, sputum,  wheezing.  GI: See history of present illness. GU:  Negative for dysuria, hematuria, urinary incontinence, urinary frequency, nocturnal urination.  Endo: Negative for unusual weight change.    Physical Examination:   BP 134/79   Pulse 88   Temp (!) 97.3 F (36.3 C)   Ht 5\' 3"  (1.6 m)   Wt 180 lb 3.2 oz (81.7 kg)   BMI 31.92 kg/m   General: Well-nourished, well-developed in no acute distress.  Eyes: No icterus. Mouth: masked Lungs: Clear to auscultation bilaterally.  Heart: Regular rate and rhythm, no murmurs rubs or gallops.  Abdomen: Bowel sounds are normal, nontender, nondistended, no hepatosplenomegaly or masses, no abdominal bruits or hernia , no rebound or guarding.   Extremities: No lower extremity edema. No clubbing or deformities. Neuro: Alert and oriented x 4   Skin: Warm and dry, no jaundice.   Psych: Alert and cooperative, normal mood and affect.   Assessment:  GERD: Continues to require omeprazole 20 mg daily.  If she stops medication, within 3 days she has recurrent significant reflux symptoms.  EGD previously negative for Barrett's.  No alarm symptoms at this time.  IBS: Predominantly diarrhea, seldom constipation.  Describes symptoms as self-limiting.  Abdominal bloating/gas: Occasional upper abdominal bloating, excessive gas/belching.  No abdominal pain.  Takes probiotic daily.  With next switch, look for different strain.  Trial of Gas-X as needed.  Avoid high FODMAP foods  Screening colonoscopy: Due at this time.   Plan: Omeprazole 20 mg daily before breakfast, #90, 3 refills provided Avoid high FODMAP foods.  Trial of Gas-X as needed for bloating and gas. Monitor bloating/gas, if symptoms do not prove or become more progressive, she will let us know. Screening colonoscopy in the near future with conscious sedation with Dr. Gala Romney. ASA II.  I have discussed the risks, alternatives, benefits with regards to but not limited to the risk of reaction to  medication, bleeding, infection, perforation and the patient is agreeable to proceed. Written consent to be obtained.

## 2021-03-06 ENCOUNTER — Ambulatory Visit (HOSPITAL_COMMUNITY)
Admission: RE | Admit: 2021-03-06 | Discharge: 2021-03-06 | Disposition: A | Discharge status: Home / Self Care | Payer: PPO | Attending: Internal Medicine | Admitting: Internal Medicine

## 2021-03-06 ENCOUNTER — Encounter (HOSPITAL_COMMUNITY): Payer: Self-pay | Admitting: Internal Medicine

## 2021-03-06 ENCOUNTER — Encounter (HOSPITAL_COMMUNITY)
Admission: RE | Disposition: A | Discharge status: Home / Self Care | Payer: Self-pay | Source: Home / Self Care | Attending: Internal Medicine

## 2021-03-06 ENCOUNTER — Other Ambulatory Visit: Payer: Self-pay

## 2021-03-06 DIAGNOSIS — K621 Rectal polyp: Secondary | ICD-10-CM | POA: Insufficient documentation

## 2021-03-06 DIAGNOSIS — Z1211 Encounter for screening for malignant neoplasm of colon: Secondary | ICD-10-CM | POA: Diagnosis not present

## 2021-03-06 DIAGNOSIS — K635 Polyp of colon: Secondary | ICD-10-CM | POA: Insufficient documentation

## 2021-03-06 DIAGNOSIS — Z139 Encounter for screening, unspecified: Secondary | ICD-10-CM | POA: Diagnosis not present

## 2021-03-06 HISTORY — PX: COLONOSCOPY: SHX5424

## 2021-03-06 HISTORY — PX: POLYPECTOMY: SHX5525

## 2021-03-06 SURGERY — COLONOSCOPY
Anesthesia: Moderate Sedation

## 2021-03-06 MED ORDER — MEPERIDINE HCL 100 MG/ML IJ SOLN
INTRAMUSCULAR | Status: DC | PRN
  Administered 2021-03-06: 40 mg

## 2021-03-06 MED ORDER — SODIUM CHLORIDE 0.9 % IV SOLN: INTRAVENOUS | Status: DC

## 2021-03-06 MED ORDER — ONDANSETRON HCL 4 MG/2ML IJ SOLN
INTRAMUSCULAR | Status: AC
  Filled 2021-03-06: qty 2

## 2021-03-06 MED ORDER — MIDAZOLAM HCL 5 MG/5ML IJ SOLN
INTRAMUSCULAR | Status: AC
  Filled 2021-03-06: qty 10

## 2021-03-06 MED ORDER — MEPERIDINE HCL 50 MG/ML IJ SOLN
INTRAMUSCULAR | Status: AC
  Filled 2021-03-06: qty 1

## 2021-03-06 MED ORDER — ONDANSETRON HCL 4 MG/2ML IJ SOLN
INTRAMUSCULAR | Status: DC | PRN
  Administered 2021-03-06: 4 mg via INTRAVENOUS

## 2021-03-06 MED ORDER — MIDAZOLAM HCL 5 MG/5ML IJ SOLN
INTRAMUSCULAR | Status: DC | PRN
  Administered 2021-03-06 (×2): 2 mg via INTRAVENOUS
  Administered 2021-03-06 (×2): 1 mg via INTRAVENOUS

## 2021-03-06 NOTE — Discharge Instructions (Signed)
°  Colonoscopy Discharge Instructions  Read the instructions outlined below and refer to this sheet in the next few weeks. These discharge instructions provide you with general information on caring for yourself after you leave the hospital. Your doctor may also give you specific instructions. While your treatment has been planned according to the most current medical practices available, unavoidable complications occasionally occur. If you have any problems or questions after discharge, call Dr. Gala Romney at [phone]. ACTIVITY You may resume your regular activity, but move at a slower pace for the next 24 hours.  Take frequent rest periods for the next 24 hours.  Walking will help get rid of the air and reduce the bloated feeling in your belly (abdomen).  No driving for 24 hours (because of the medicine (anesthesia) used during the test).   Do not sign any important legal documents or operate any machinery for 24 hours (because of the anesthesia used during the test).  NUTRITION Drink plenty of fluids.  You may resume your normal diet as instructed by your doctor.  Begin with a light meal and progress to your normal diet. Heavy or fried foods are harder to digest and may make you feel sick to your stomach (nauseated).  Avoid alcoholic beverages for 24 hours or as instructed.  MEDICATIONS You may resume your normal medications unless your doctor tells you otherwise.  WHAT YOU CAN EXPECT TODAY Some feelings of bloating in the abdomen.  Passage of more gas than usual.  Spotting of blood in your stool or on the toilet paper.  IF YOU HAD POLYPS REMOVED DURING THE COLONOSCOPY: No aspirin products for 7 days or as instructed.  No alcohol for 7 days or as instructed.  Eat a soft diet for the next 24 hours.  FINDING OUT THE RESULTS OF YOUR TEST Not all test results are available during your visit. If your test results are not back during the visit, make an appointment with your caregiver to find out the  results. Do not assume everything is normal if you have not heard from your caregiver or the medical facility. It is important for you to follow up on all of your test results.  SEEK IMMEDIATE MEDICAL ATTENTION IF: You have more than a spotting of blood in your stool.  Your belly is swollen (abdominal distention).  You are nauseated or vomiting.  You have a temperature over 101.  You have abdominal pain or discomfort that is severe or gets worse throughout the day.    2 small polyps removed today  Further recommendations to follow pending review of pathology report  At patient request, called [facility] at [phone] to [place] a message.

## 2021-03-06 NOTE — H&P (Signed)
@LOGO @   Primary Care Physician:  Redmond School, MD Primary Gastroenterologist:  Dr.   Pre-Procedure History & Physical: HPI:  Ruth Warner is a 65 y.o. female is here for a screening colonoscopy.  No bowel symptoms.  Negative colonoscopy 2012.  Past Medical History:  Diagnosis Date   Abdominal pain, chronic, epigastric    Chronic headaches    GERD (gastroesophageal reflux disease)    History of concussion    Staples, hit a shelf   IBS (irritable bowel syndrome)    Normal cardiac stress test    February 2009 and May 2016 ([facility])   Palpitations    Pelvic relaxation 2015   Rectocele 2015   S/P colonoscopy 2001   Dr. Gala Romney: normal. Repeat 10 years   S/P endoscopy 2001   Dr. Gala Romney: normal    Past Surgical History:  Procedure Laterality Date   COLONOSCOPY  07/30/2010   Oluwaferanmi Wain: Single diminutive hyperplastic polyp in the mid descending colon status post cold biopsy removal   ESOPHAGOGASTRODUODENOSCOPY N/A 02/12/2015   TTS:[id number] EGD   ESOPHAGOGASTRODUODENOSCOPY ENDOSCOPY     With RMR, unable to remember date.   TONSILLECTOMY      Prior to Admission medications   Medication Sig Start Date End Date Taking? Authorizing Provider  Ascorbic Acid (VITAMIN C) 1000 MG tablet Take 1,000 mg by mouth daily.   Yes [provider]  celecoxib (CELEBREX) 200 MG capsule Take 200 mg by mouth daily as needed (foot pain). 03/05/20  Yes [provider]  cholecalciferol (VITAMIN D3) 25 MCG (1000 UNIT) tablet Take 1,000 Units by mouth daily.   Yes [provider]  Garlic 0300 MG CAPS Take 1,000 mg by mouth daily.   Yes [provider]  hydrocortisone-pramoxine Navos) 2.5-1 % rectal cream Place 1 application rectally as needed. 05/31/20  Yes Derrek Monaco A, NP  ibuprofen (ADVIL) 200 MG tablet Take 200-400 mg by mouth every 6 (six) hours as needed for moderate pain or headache.   Yes [provider]  Lactobacillus (PROBIOTIC ACIDOPHILUS PO)  Take 1 capsule by mouth daily. Once daily   Yes [provider]  loratadine (CLARITIN) 10 MG tablet Take 10 mg by mouth daily as needed for allergies.   Yes [provider]  Multiple Vitamin (MULTIVITAMIN WITH MINERALS) TABS tablet Take 1 tablet by mouth daily.   Yes [provider]  Omega-3 Fatty Acids (FISH OIL) 1200 MG CAPS Take 1,200 mg by mouth daily.   Yes [provider]  omeprazole (PRILOSEC) 20 MG capsule TAKE 1 CAPSULE BY MOUTH DAILY 30 MINUTES BEFORE BREAKFAST. 01/28/21  Yes Mahala Menghini, PA-C  polyethylene glycol-electrolytes (NULYTELY) 420 g solution As directed 01/28/21  Yes Merlin Ege, Cristopher Estimable, MD  Propylene Glycol (SYSTANE COMPLETE) 0.6 % SOLN Place 1 drop into both eyes 2 (two) times daily.   Yes [provider]    Allergies as of 01/28/2021 - Review Complete 01/28/2021  Allergen Reaction Noted   Cephalexin Hives 04/19/2015   Dexilant [dexlansoprazole]  03/08/2018   Penicillins      Family History  Problem Relation Age of Onset   Colon cancer Paternal Grandfather    Heart attack Mother    Diabetes Mother    Diabetes Father    Heart attack Father    Alzheimer's disease Father    Colon polyps Father        not pre-cancerous     Social History   Socioeconomic History   Marital status: Married  Spouse name: Not on file   Number of children: 2   Years of education: Not on file   Highest education level: Not on file  Occupational History   Occupation: retired    Fish farm manager: [place]: [employer]  Tobacco Use   Smoking status: Never   Smokeless tobacco: Never  Vaping Use   Vaping Use: Never used  Substance and Sexual Activity   Alcohol use: No    Alcohol/week: 0.0 standard drinks   Drug use: No   Sexual activity: Yes    Birth control/protection: Post-menopausal, Other-see comments    Comment: vasectomy  Other Topics Concern   Not on file  Social History Narrative    Not on file   Social Determinants of Health   Financial Resource Strain: Low Risk    Difficulty of Paying Living Expenses: Not hard at all  Food Insecurity: No Food Insecurity   Worried About Charity fundraiser in the Last Year: Never true   [place] in the Last Year: Never true  Transportation Needs: No Transportation Needs   Lack of Transportation (Medical): No   Lack of Transportation (Non-Medical): No  Physical Activity: Insufficiently Active   Days of Exercise per Week: 1 day   Minutes of Exercise per Session: 30 min  Stress: No Stress Concern Present   Feeling of Stress : Not at all  Social Connections: Moderately Integrated   Frequency of Communication with Friends and Family: More than three times a week   Frequency of Social Gatherings with Friends and Family: Twice a week   Attends Religious Services: More than 4 times per year   Active Member of [employer] or Organizations: No   Attends Music therapist: Never   Marital Status: Married  Human resources officer Violence: Not At Risk   Fear of Current or Ex-Partner: No   Emotionally Abused: No   Physically Abused: No   Sexually Abused: No    Review of Systems: See HPI, otherwise negative ROS  Physical Exam: BP 133/71    Pulse 95    Temp 98.5 F (36.9 C) (Oral)    Resp (!) 23    Ht 5\' 3"  (1.6 m)    Wt 78.5 kg    SpO2 99%    BMI 30.65 kg/m  General:   Alert,  Well-developed, well-nourished, pleasant and cooperative in NAD galy. Lungs:  Clear throughout to auscultation.   No wheezes, crackles, or rhonchi. No acute distress. Heart:  Regular rate and rhythm; no murmurs, clicks, rubs,  or gallops. Abdomen:  Soft, nontender and nondistended. No masses, hepatosplenomegaly or hernias noted. Normal bowel sounds, without guarding, and without rebound.    Impression/Plan: Ruth Warner is now here to undergo a screening colonoscopy.  Average risk screening examination  Risks, benefits, limitations,  imponderables and alternatives regarding colonoscopy have been reviewed with the patient. Questions have been answered. All parties agreeable.     Notice:  This dictation was prepared with Dragon dictation along with smaller phrase technology. Any transcriptional errors that result from this process are unintentional and may not be corrected upon review.

## 2021-03-06 NOTE — Op Note (Signed)
[facility] Patient Name: Ruth Warner Procedure Date: 03/06/2021 7:08 AM MRN: [id number] Date of Birth: [birth year] Attending MD: Norvel Richards , MD CSN: [id number] Age: 65 Admit Type: Outpatient Procedure:                Colonoscopy Indications:              Screening for colorectal malignant neoplasm Providers:                Norvel Richards, MD, Lambert Mody, Casimer Bilis, Technician Referring MD:              Medicines:                Meperidine 40 mg IV, Midazolam 6 mg IV Complications:            No immediate complications. Estimated Blood Loss:     Estimated blood loss was minimal. Procedure:                Pre-Anesthesia Assessment:                           - Prior to the procedure, a History and Physical                            was performed, and patient medications and                            allergies were reviewed. The patient's tolerance of                            previous anesthesia was also reviewed. The risks                            and benefits of the procedure and the sedation                            options and risks were discussed with the patient.                            All questions were answered, and informed consent                            was obtained. Prior Anticoagulants: The patient has                            taken no previous anticoagulant or antiplatelet                            agents. ASA Grade Assessment: II - A patient with                            mild systemic disease. After reviewing the risks  and benefits, the patient was deemed in                            satisfactory condition to undergo the procedure.                           After obtaining informed consent, the colonoscope                            was passed under direct vision. Throughout the                            procedure, the patient's blood pressure, pulse, and                             oxygen saturations were monitored continuously. The                            PCF-HQ190L ([id number]) scope was introduced through                            the anus and advanced to the the cecum, identified                            by appendiceal orifice and ileocecal valve. The                            colonoscopy was performed without difficulty. The                            patient tolerated the procedure well. The quality                            of the bowel preparation was adequate. The                            ileocecal valve, appendiceal orifice, and rectum                            were photographed. The colonoscopy was performed                            without difficulty. The patient tolerated the                            procedure well. Scope In: 8:48:51 AM Scope Out: 9:03:15 AM Scope Withdrawal Time: 0 hours 10 minutes 0 seconds  Total Procedure Duration: 0 hours 14 minutes 24 seconds  Findings:      The perianal and digital rectal examinations were normal.      A 3 mm polyp was found in the mid rectum. The polyp was sessile. The       polyp was removed with a cold snare. Resection and retrieval were       complete. Estimated blood loss was minimal.  A 3 mm polyp was found in the descending colon. The polyp was sessile.       The polyp was removed with a cold snare. Resection and retrieval were       complete. Estimated blood loss was minimal. Estimated blood loss was       minimal.      The exam was otherwise without abnormality. Rectal mucosa seen well       onface. Rectal vault too small to retroflex. Impression:               - One 3 mm polyp in the mid rectum, removed with a                            cold snare. Resected and retrieved.                           - One 3 mm polyp in the descending colon, removed                            with a cold snare. Resected and retrieved.                           - The  examination was otherwise normal. Moderate Sedation:      Moderate (conscious) sedation was administered by the endoscopy nurse       and supervised by the endoscopist. The following parameters were       monitored: oxygen saturation, heart rate, blood pressure, respiratory       rate, EKG, adequacy of pulmonary ventilation, and response to care.       Total physician intraservice time was 21 minutes. Recommendation:           - Patient has a contact number available for                            emergencies. The signs and symptoms of potential                            delayed complications were discussed with the                            patient. Return to normal activities tomorrow.                            Written discharge instructions were provided to the                            patient.                           - Resume previous diet.                           - Continue present medications.                           - Repeat colonoscopy after studies are complete for  surveillance.                           - Return to GI office (date not yet determined). Procedure Code(s):        --- Professional ---                           [phone], Colonoscopy, flexible; with removal of                            tumor(s), polyp(s), or other lesion(s) by snare                            technique                           G0500, Moderate sedation services provided by the                            same physician or other qualified health care                            professional performing a gastrointestinal                            endoscopic service that sedation supports,                            requiring the presence of an independent trained                            observer to assist in the monitoring of the                            patient's level of consciousness and physiological                            status; initial 15 minutes of  intra-service time;                            patient age 73 years or older (additional time may                            be reported with [phone], as appropriate) Diagnosis Code(s):        --- Professional ---                           Z12.11, Encounter for screening for malignant                            neoplasm of colon                           K62.1, Rectal polyp  K63.5, Polyp of colon CPT copyright 2019 American Medical Association. All rights reserved. The codes documented in this report are preliminary and upon coder review may  be revised to meet current compliance requirements. Cristopher Estimable. Neeta Storey, MD Norvel Richards, MD 03/06/2021 9:18:41 AM This report has been signed electronically. Number of Addenda: 0

## 2021-03-06 NOTE — H&P (Signed)
done

## 2021-03-07 LAB — SURGICAL PATHOLOGY

## 2021-03-10 ENCOUNTER — Encounter: Payer: Self-pay | Admitting: Internal Medicine

## 2021-03-11 ENCOUNTER — Encounter (HOSPITAL_COMMUNITY): Payer: Self-pay | Admitting: Internal Medicine

## 2021-06-13 ENCOUNTER — Encounter: Payer: Self-pay | Admitting: Internal Medicine

## 2021-07-09 ENCOUNTER — Other Ambulatory Visit: Payer: Self-pay | Admitting: Cardiology

## 2021-07-09 ENCOUNTER — Ambulatory Visit: Payer: PPO | Admitting: Student

## 2021-07-09 ENCOUNTER — Encounter: Payer: Self-pay | Admitting: Student

## 2021-07-09 ENCOUNTER — Ambulatory Visit (INDEPENDENT_AMBULATORY_CARE_PROVIDER_SITE_OTHER): Payer: PPO

## 2021-07-09 VITALS — BP 132/70 | HR 98 | Ht 63.0 in | Wt 179.0 lb

## 2021-07-09 DIAGNOSIS — R002 Palpitations: Secondary | ICD-10-CM

## 2021-07-09 DIAGNOSIS — R030 Elevated blood-pressure reading, without diagnosis of hypertension: Secondary | ICD-10-CM | POA: Diagnosis not present

## 2021-07-09 NOTE — Patient Instructions (Addendum)
Follow-Up: ?Follow up with Dr. Domenic Polite in 6 months.  ? ?Any Other Special Instructions Will Be Listed Below (If Applicable). ? ? ? ? ?If you need a refill on your cardiac medications before your next appointment, please call your pharmacy. ? ? ?ZIO XT- Long Term Monitor Instructions  ? ?Your physician has requested you wear your ZIO patch monitor___7____days.  ? ?This is a single patch monitor.  [employer] supplies one patch monitor per enrollment.  Additional stickers are not available. ?  ?Please do not apply patch if you will be having a Nuclear Stress Test, Echocardiogram, Cardiac CT, MRI, or Chest Xray during the time frame you would be wearing the monitor. The patch cannot be worn during these tests.  You cannot remove and re-apply the ZIO XT patch monitor. ?  ?Your ZIO patch monitor will be sent USPS Priority mail from [facility] directly to your home address. The monitor may also be mailed to a PO BOX if home delivery is not available.   It may take 3-5 days to receive your monitor after you have been enrolled. ?  ?Once you have received you monitor, please review enclosed instructions.  Your monitor has already been registered assigning a specific monitor serial # to you. ?  ?Applying the monitor  ? ?Shave hair from upper left chest. ?  ?Hold abrader disc by orange tab.  Rub abrader in 40 strokes over left upper chest as indicated in your monitor instructions. ?  ?Clean area with 4 enclosed alcohol pads .  Use all pads to assure are is cleaned thoroughly.  Let dry.  ? ?Apply patch as indicated in monitor instructions.  Patch will be place under collarbone on left side of chest with arrow pointing upward. ?  ?Rub patch adhesive wings for 2 minutes.Remove white label marked "1".  Remove white label marked "2".  Rub patch adhesive wings for 2 additional minutes. ?  ?While looking in a mirror, press and release button in center of patch.  A small green light will flash 3-4 times .  This will be your  only indicator the monitor has been turned on. ?    ?Do not shower for the first 24 hours.  You may shower after the first 24 hours. ?  ?Press button if you feel a symptom. You will hear a small click.  Record Date, Time and Symptom in the Patient Log Book. ?  ?When you are ready to remove patch, follow instructions on last 2 pages of Patient Log Book.  Stick patch monitor onto last page of Patient Log Book. ?  ?Place Patient Log Book in [facility] box.  Use locking tab on box and tape box closed securely.  The [employer] and [employer] has [employer] on it.  Please place in mailbox as soon as possible.  Your physician should have your test results approximately 7 days after the monitor has been mailed back to [facility]. ?  ?Call [facility] at [phone] if you have questions regarding your ZIO XT patch monitor.  Call them immediately if you see an orange light blinking on your monitor. ?  ?If your monitor falls off in less than 4 days contact our Monitor department at [phone].  If your monitor becomes loose or falls off after 4 days call [employer] at [phone] for suggestions on securing your monitor.  ? ?

## 2021-07-09 NOTE — Progress Notes (Signed)
? ?Cardiology Office Note   ? ?Date:  07/09/2021  ? ?ID:  Ruth Warner, DOB 1956-01-06, MRN [id number] ? ?PCP:  Redmond School, MD  ?Cardiologist: Rozann Lesches, MD   ? ?Chief Complaint  ?Patient presents with  ? Palpitations  ? ? ?History of Present Illness:   ? ?Ruth Warner is a 66 y.o. female with past medical history of pSVT and GERD who presents to the office today for evaluation of worsening palpitations. ? ?She was last examined by myself in 05/2019 and had been more fatigued since her recent COVID-19 diagnosis. She reported still having occasional intermittent palpitations but symptoms had improved with hydration, therefore she was not started on medical therapy. A follow-up echocardiogram was obtained and showed a preserved EF of 65 to 70% with no regional wall motion abnormalities. She did not have any significant valve abnormalities. ? ?In talking with the patient today, she reports having more palpitations over the past few months and was initially consuming more tea and diet sodas at that time but has reduced her use and is trying to consume more water. She reports feeling an occasional skipping sensation but also reports a vibration along her chest. Symptoms can last from several seconds to minutes. She has tried taking ASA 81 mg and says symptoms do improve at times with this. She has a smart watch and tracks her heart rate at home and this has ranged from the 50's to 130's. She denies any recent exertional chest pain or dyspnea on exertion. No recent orthopnea, PND or pitting edema. ? ? ?Past Medical History:  ?Diagnosis Date  ? Abdominal pain, chronic, epigastric   ? Chronic headaches   ? GERD (gastroesophageal reflux disease)   ? History of concussion   ? Staples, hit a shelf  ? IBS (irritable bowel syndrome)   ? Normal cardiac stress test   ? February 2009 and May 2016 ([facility])  ? Palpitations   ? Pelvic relaxation 2015  ? Rectocele 2015  ? S/P colonoscopy 2001  ? Dr. Gala Romney: normal. Repeat  10 years  ? S/P endoscopy 2001  ? Dr. Gala Romney: normal  ? ? ?Past Surgical History:  ?Procedure Laterality Date  ? COLONOSCOPY  07/30/2010  ? Rourk: Single diminutive hyperplastic polyp in the mid descending colon status post cold biopsy removal  ? COLONOSCOPY N/A 03/06/2021  ? Procedure: COLONOSCOPY;  Surgeon: Daneil Dolin, MD;  [facility]: [facility];  Service: Endoscopy;  Laterality: N/A;  8:30am  ? ESOPHAGOGASTRODUODENOSCOPY N/A 02/12/2015  ? CBJ:SEGBTD EGD  ? ESOPHAGOGASTRODUODENOSCOPY ENDOSCOPY    ? With RMR, unable to remember date.  ? POLYPECTOMY  03/06/2021  ? Procedure: POLYPECTOMY;  Surgeon: Daneil Dolin, MD;  Location: [facility];  Service: Endoscopy;;  ? TONSILLECTOMY    ? ? ?Current Medications: ?Outpatient Medications Prior to Visit  ?Medication Sig Dispense Refill  ? Ascorbic Acid (VITAMIN C) 1000 MG tablet Take 1,000 mg by mouth daily.    ? celecoxib (CELEBREX) 200 MG capsule Take 200 mg by mouth daily as needed (foot pain).    ? cholecalciferol (VITAMIN D3) 25 MCG (1000 UNIT) tablet Take 1,000 Units by mouth daily.    ? Garlic 1761 MG CAPS Take 1,000 mg by mouth daily.    ? hydrocortisone-pramoxine (ANALPRAM-HC) 2.5-1 % rectal cream Place 1 application rectally as needed. 30 g 3  ? ibuprofen (ADVIL) 200 MG tablet Take 200-400 mg by mouth every 6 (six) hours as needed for moderate pain or headache.    ?  Lactobacillus (PROBIOTIC ACIDOPHILUS PO) Take 1 capsule by mouth daily. Once daily    ? loratadine (CLARITIN) 10 MG tablet Take 10 mg by mouth daily as needed for allergies.    ? Multiple Vitamin (MULTIVITAMIN WITH MINERALS) TABS tablet Take 1 tablet by mouth daily.    ? Omega-3 Fatty Acids (FISH OIL) 1200 MG CAPS Take 1,200 mg by mouth daily.    ? omeprazole (PRILOSEC) 20 MG capsule TAKE 1 CAPSULE BY MOUTH DAILY 30 MINUTES BEFORE BREAKFAST. 90 capsule 3  ? Propylene Glycol (SYSTANE COMPLETE) 0.6 % SOLN Place 1 drop into both eyes 2 (two) times daily.    ? polyethylene glycol-electrolytes  (NULYTELY) 420 g solution As directed 4000 mL 0  ? ?No facility-administered medications prior to visit.  ?  ? ?Allergies:   Cephalexin, Dexilant [dexlansoprazole], Other, and Penicillins  ? ?Social History  ? ?Socioeconomic History  ? Marital status: Married  ?  Spouse name: Not on file  ? Number of children: 2  ? Years of education: Not on file  ? Highest education level: Not on file  ?Occupational History  ? Occupation: retired  ?  Employer: [employer]  ?  Comment: [place] [employer]  ?Tobacco Use  ? Smoking status: Never  ? Smokeless tobacco: Never  ?Vaping Use  ? Vaping Use: Never used  ?Substance and Sexual Activity  ? Alcohol use: No  ?  Alcohol/week: 0.0 standard drinks  ? Drug use: No  ? Sexual activity: Yes  ?  Birth control/protection: Post-menopausal, Other-see comments  ?  Comment: vasectomy  ?Other Topics Concern  ? Not on file  ?Social History Narrative  ? Not on file  ? ?Social Determinants of Health  ? ?Financial Resource Strain: Not on file  ?Food Insecurity: Not on file  ?Transportation Needs: Not on file  ?Physical Activity: Not on file  ?Stress: Not on file  ?Social Connections: Not on file  ?  ? ?Family History:  The patient's family history includes Alzheimer's disease in her father; Colon cancer in her paternal grandfather; Colon polyps in her father; Diabetes in her father and mother; Heart attack in her father and mother.  ? ?Review of Systems:   ? ?Please see the history of present illness.    ? ?All other systems reviewed and are otherwise negative except as noted above. ? ? ?Physical Exam:   ? ?VS:  BP 132/70   Pulse 98   Ht '5\' 3"'$  (1.6 m)   Wt 179 lb (81.2 kg)   SpO2 98%   BMI 31.71 kg/m?    ?General: Well developed, well nourished,female appearing in no acute distress. ?Head: Normocephalic, atraumatic. ?Neck: No carotid bruits. JVD not elevated.  ?Lungs: Respirations regular and unlabored, without wheezes or rales.  ?Heart: Regular rate and rhythm. No  S3 or S4.  No murmur, no rubs, or gallops appreciated. ?Abdomen: Appears non-distended. No obvious abdominal masses. ?Msk:  Strength and tone appear normal for age. No obvious joint deformities or effusions. ?Extremities: No clubbing or cyanosis. No pitting edema.  Distal pedal pulses are 2+ bilaterally. ?Neuro: Alert and oriented X 3. Moves all extremities spontaneously. No focal deficits noted. ?Psych:  Responds to questions appropriately with a normal affect. ?Skin: No rashes or lesions noted ? ?Wt Readings from Last 3 Encounters:  ?07/09/21 179 lb (81.2 kg)  ?03/06/21 173 lb (78.5 kg)  ?01/28/21 180 lb 3.2 oz (81.7 kg)  ?  ? ?Studies/Labs Reviewed:  ? ?EKG:  EKG  is ordered today. The ekg ordered today demonstrates normal sinus rhythm, heart rate 98 with no acute ST changes when compared to prior tracings. ? ?Recent Labs: ?No results found for requested labs within last 8760 hours.  ? ?Lipid Panel ?   ?Component Value Date/Time  ? CHOL 165 10/13/2018 0955  ? TRIG 100 10/13/2018 0955  ? HDL 46 10/13/2018 0955  ? CHOLHDL 3.6 10/13/2018 0955  ? CHOLHDL 3.3 05/01/2007 0420  ? VLDL 6 05/01/2007 0420  ? [place] 99 10/13/2018 0955  ? ? ?Additional studies/ records that were reviewed today include:  ? ?Event Monitor: 05/2015 ?30 day event recorder. Sinus rhythm and sinus tachycardia noted. Rare PACs and brief atrial runs of 5-10 beats and 10-20 beats - regular and not suggestive of atrial fibrillation. Reported symptoms and fast heart beat and fluttering did not specifically correlate with a specific finding. ? ?Echocardiogram: 05/2019 ?IMPRESSIONS  ? ? ? 1. Left ventricular ejection fraction, by estimation, is 65 to 70%. The  ?left ventricle has normal function. The left ventricle has no regional  ?wall motion abnormalities. Left ventricular diastolic parameters were  ?normal.  ? 2. Right ventricular systolic function is normal. The right ventricular  ?size is normal. Tricuspid regurgitation signal is inadequate for  assessing  ?PA pressure.  ? 3. The mitral valve is grossly normal. Trivial mitral valve  ?regurgitation.  ? 4. The aortic valve is tricuspid. Aortic valve regurgitation is not  ?visualized.  ? 5. The inferior ven

## 2021-07-29 DIAGNOSIS — R002 Palpitations: Secondary | ICD-10-CM | POA: Diagnosis not present

## 2021-09-05 ENCOUNTER — Ambulatory Visit: Payer: PPO | Admitting: Student

## 2021-10-04 IMAGING — MG MM DIGITAL DIAGNOSTIC UNILAT*L* W/ TOMO W/ CAD
4 series · 4 of 12 positions shown · non-contrast
Comparison: Previous.

CLINICAL DATA: Screening recall for possible left breast mass.

EXAM:
DIGITAL DIAGNOSTIC UNILATERAL LEFT MAMMOGRAM WITH TOMO AND CAD;
ULTRASOUND LEFT BREAST LIMITED

[L CC synth-2D]
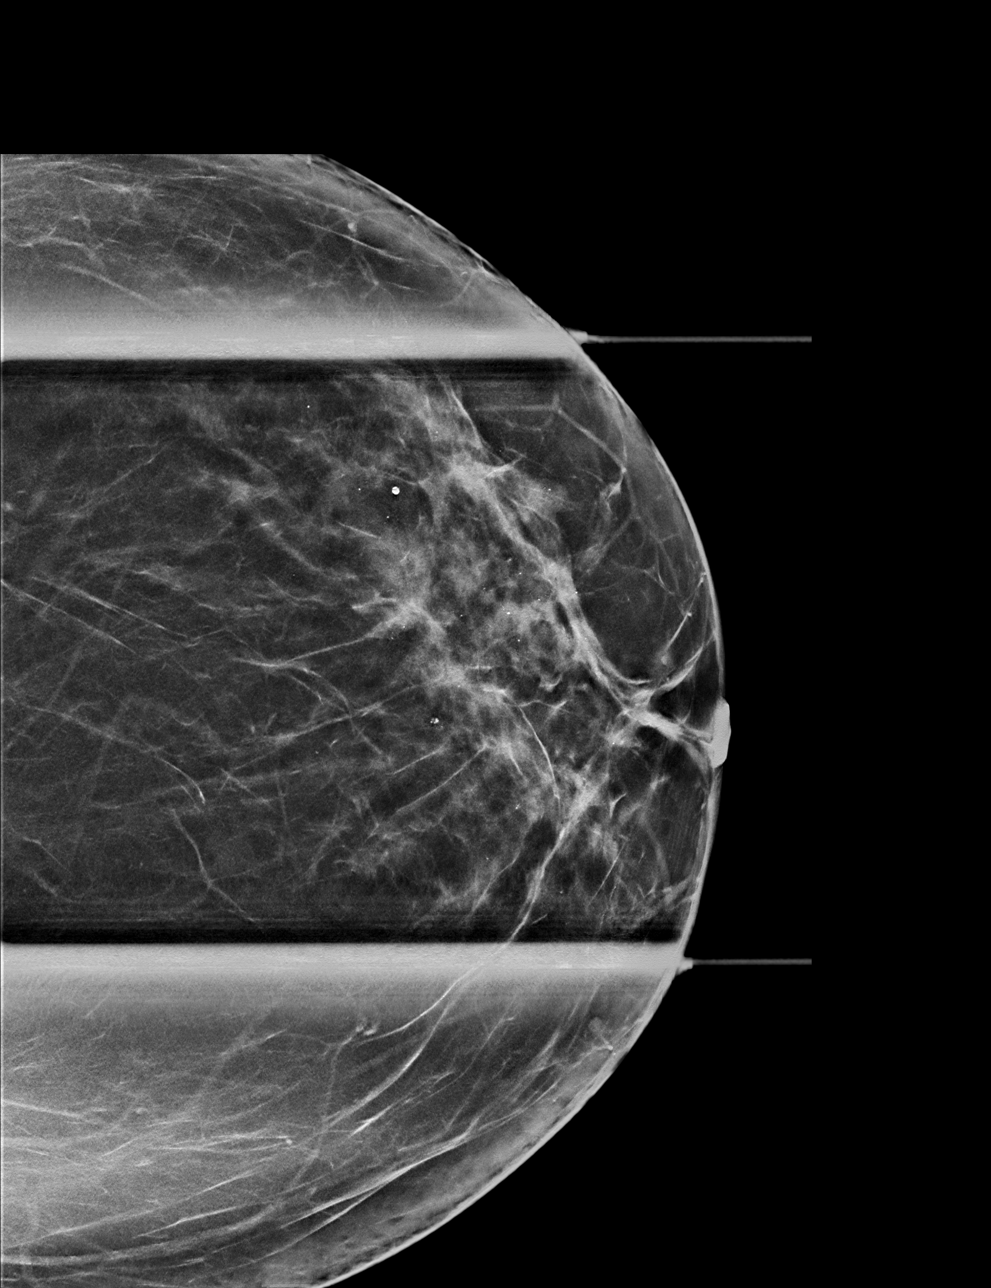

[L MLO synth-2D]
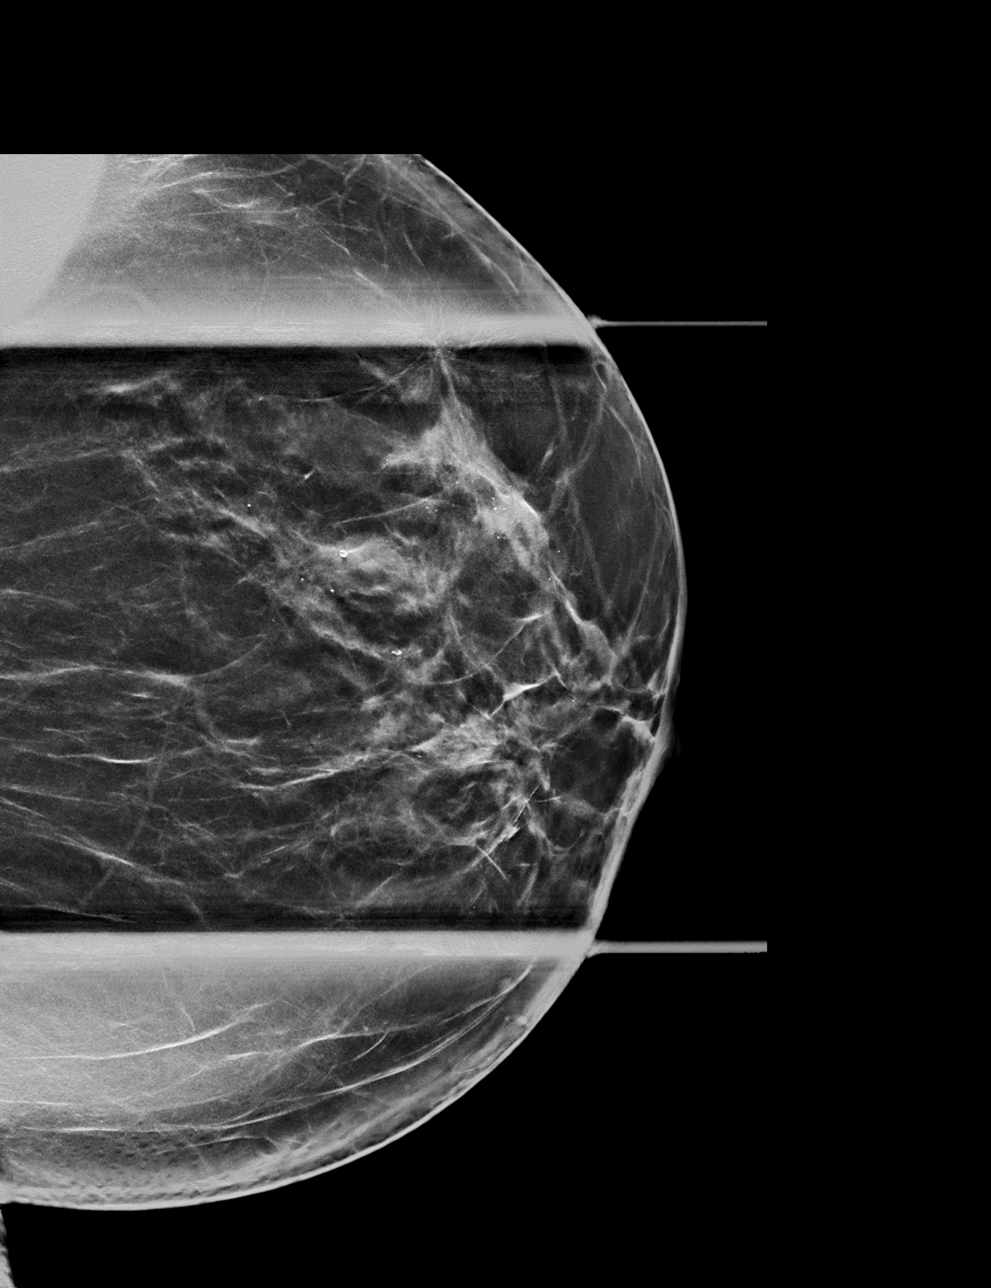

[L MLO tomo · tomo slice 33/65.0]
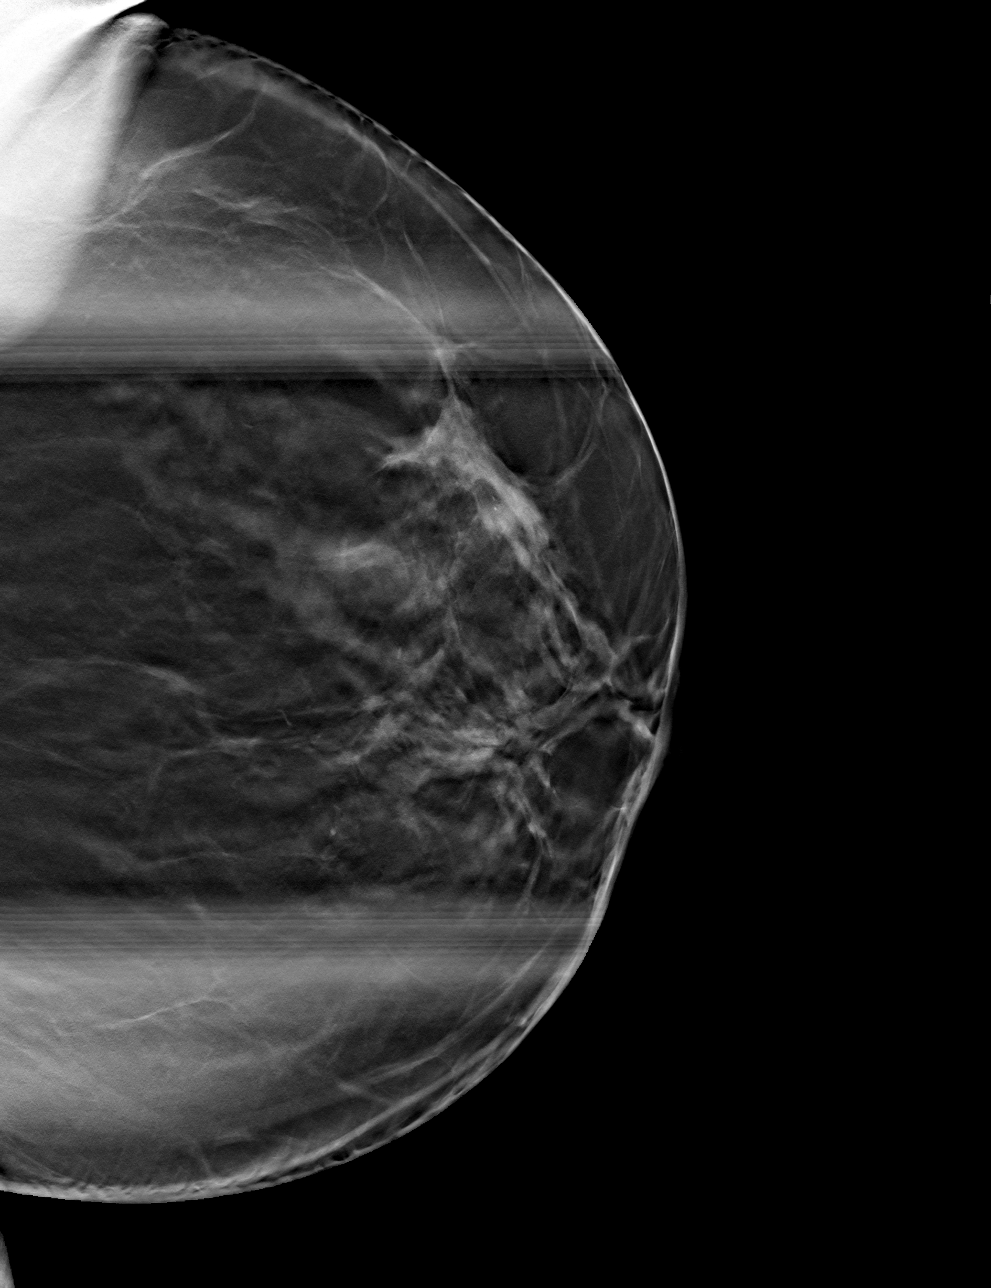

[L CC tomo · tomo slice 33/65.0]
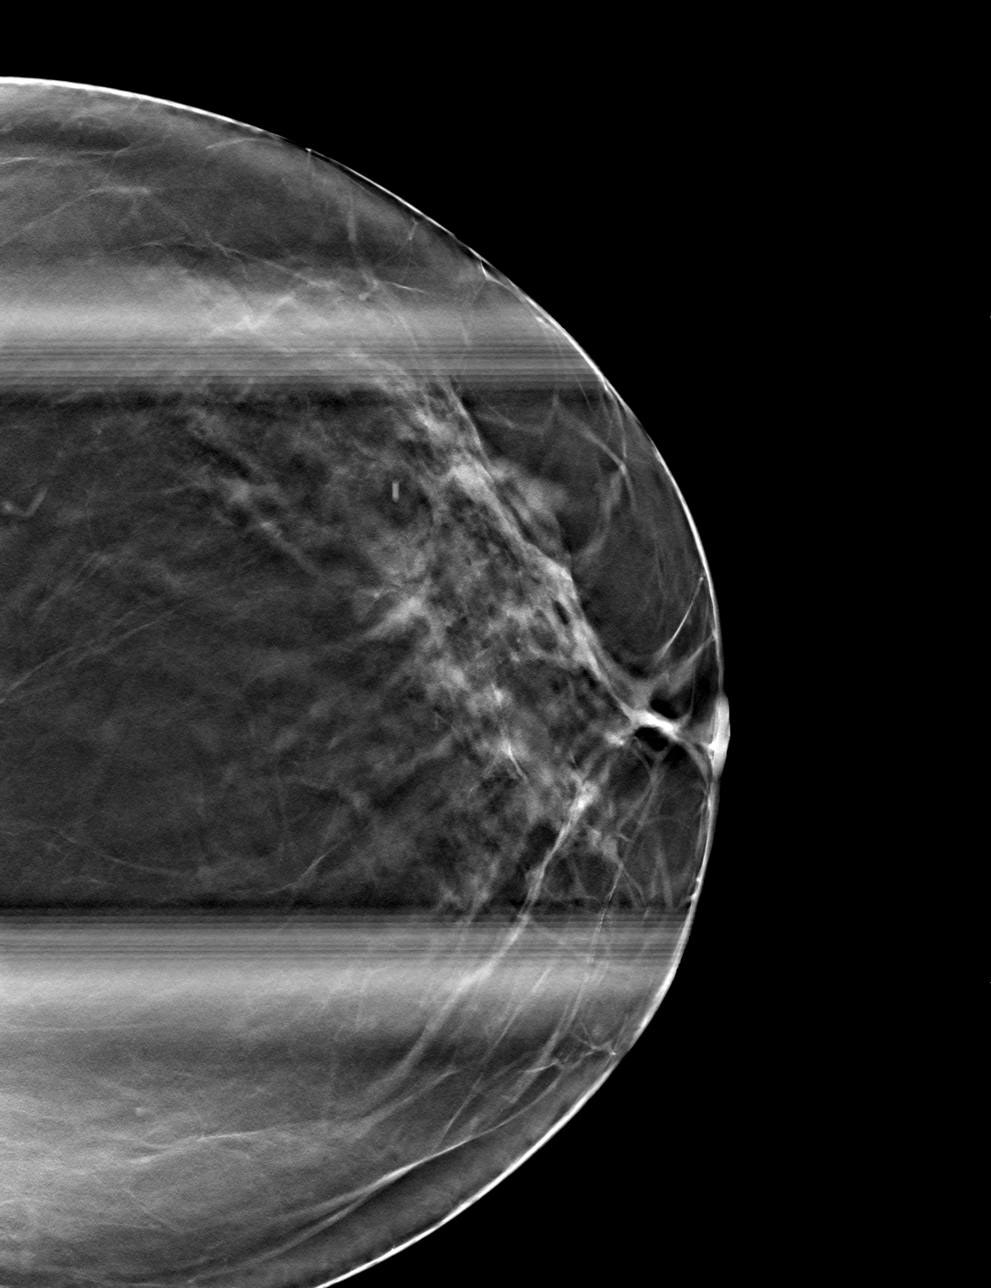

[4 of 12 positions shown; findings below may reference images not displayed]

ACR Breast Density Category c: The breast tissue is heterogeneously
dense, which may obscure small masses.
FINDINGS: Spot compression tomograms were performed of the left breast. There
is an oval circumscribed mass in the outer left breast measuring
approximately 1.3 cm.

Mammographic images were processed with CAD.

Targeted ultrasound of outer left breast was performed demonstrating
several cysts. A cyst at the [DATE] position 3 cm from nipple measures
1.4 x 0.8 x 1.4 cm. This corresponds well with the mass seen in the
outer left breast at mammography.
IMPRESSION: No findings of malignancy in the left breast.

RECOMMENDATION:
Screening mammogram in one year.(Code:R5-4-YDZ)

I have discussed the findings and recommendations with the patient.
If applicable, a reminder letter will be sent to the patient
regarding the next appointment.

BI-RADS CATEGORY  2: Benign.

## 2021-12-03 ENCOUNTER — Other Ambulatory Visit: Payer: Self-pay | Admitting: Gastroenterology

## 2021-12-03 DIAGNOSIS — G8929 Other chronic pain: Secondary | ICD-10-CM

## 2021-12-09 ENCOUNTER — Encounter: Payer: Self-pay | Admitting: *Deleted

## 2021-12-25 DIAGNOSIS — E6609 Other obesity due to excess calories: Secondary | ICD-10-CM | POA: Diagnosis not present

## 2021-12-25 DIAGNOSIS — Z6831 Body mass index (BMI) 31.0-31.9, adult: Secondary | ICD-10-CM | POA: Diagnosis not present

## 2021-12-25 DIAGNOSIS — J069 Acute upper respiratory infection, unspecified: Secondary | ICD-10-CM | POA: Diagnosis not present

## 2021-12-30 ENCOUNTER — Other Ambulatory Visit (HOSPITAL_COMMUNITY): Payer: Self-pay | Admitting: Adult Health

## 2021-12-30 DIAGNOSIS — Z1231 Encounter for screening mammogram for malignant neoplasm of breast: Secondary | ICD-10-CM

## 2022-01-08 ENCOUNTER — Ambulatory Visit (HOSPITAL_COMMUNITY)
Admission: RE | Admit: 2022-01-08 | Discharge: 2022-01-08 | Disposition: A | Discharge status: Home / Self Care | Payer: PPO | Source: Ambulatory Visit | Attending: Adult Health | Admitting: Adult Health

## 2022-01-08 DIAGNOSIS — Z1231 Encounter for screening mammogram for malignant neoplasm of breast: Secondary | ICD-10-CM | POA: Diagnosis not present

## 2022-01-14 ENCOUNTER — Ambulatory Visit (INDEPENDENT_AMBULATORY_CARE_PROVIDER_SITE_OTHER): Payer: PPO | Admitting: Internal Medicine

## 2022-01-14 ENCOUNTER — Encounter: Payer: Self-pay | Admitting: Internal Medicine

## 2022-01-14 VITALS — BP 135/80 | HR 93 | Temp 98.6°F | Ht 63.0 in | Wt 181.4 lb

## 2022-01-14 DIAGNOSIS — R1011 Right upper quadrant pain: Secondary | ICD-10-CM

## 2022-01-14 DIAGNOSIS — K219 Gastro-esophageal reflux disease without esophagitis: Secondary | ICD-10-CM | POA: Diagnosis not present

## 2022-01-14 DIAGNOSIS — K589 Irritable bowel syndrome without diarrhea: Secondary | ICD-10-CM

## 2022-01-14 MED ORDER — PANTOPRAZOLE SODIUM 40 MG PO TBEC: 40.0000 mg | DELAYED_RELEASE_TABLET | Freq: Every day | ORAL | 11 refills | Status: DC

## 2022-01-14 NOTE — Progress Notes (Unsigned)
Primary Care Physician:  Redmond School, MD Primary Gastroenterologist:  Dr. Gala Romney  Pre-Procedure History & Physical: HPI:  Ruth Warner is a 65 y.o. female here for for 1 year follow-up GERD/IBS-D.  Colonoscopy last year yielded 1 hyperplastic polyp.  Consider 1 more colonoscopy in 10 years for screening so long as overall health permits.  Some breakthrough reflux symptoms on Prilosec 20 mg daily.  At times lately she has had some postprandial right upper quadrant abdominal pain associated with increased stool frequency but denies out now diarrhea.  No bleeding.  Was on a probiotic that helped but she has come off that unsure of the type. Has been on Prilosec 20 mg daily for years.  Dexilant caused swelling and made her feel bad.  No dysphagia. She has had the above-mentioned symptoms off and on for last couple of decades.  In fact in 2002 she underwent a gallbladder ultrasound and HIDA both negative (gallbladder EF 91%)   Past Medical History:  Diagnosis Date   Abdominal pain, chronic, epigastric    Chronic headaches    GERD (gastroesophageal reflux disease)    History of concussion    Staples, hit a shelf   IBS (irritable bowel syndrome)    Normal cardiac stress test    February 2009 and May 2016 ([facility])   Palpitations    Pelvic relaxation 2015   Rectocele 2015   S/P colonoscopy 2001   Dr. Gala Romney: normal. Repeat 10 years   S/P endoscopy 2001   Dr. Gala Romney: normal    Past Surgical History:  Procedure Laterality Date   COLONOSCOPY  07/30/2010   Isidore Margraf: Single diminutive hyperplastic polyp in the mid descending colon status post cold biopsy removal   COLONOSCOPY N/A 03/06/2021   Procedure: COLONOSCOPY;  Surgeon: Daneil Dolin, MD;  [facility];  Service: Endoscopy;  Laterality: N/A;  8:30am   ESOPHAGOGASTRODUODENOSCOPY N/A 02/12/2015   [id number] EGD   ESOPHAGOGASTRODUODENOSCOPY ENDOSCOPY     With RMR, unable to remember date.   POLYPECTOMY  03/06/2021    Procedure: POLYPECTOMY;  Surgeon: Daneil Dolin, MD;  [facility];  Service: Endoscopy;;   TONSILLECTOMY      Prior to Admission medications   Medication Sig Start Date End Date Taking? Authorizing Provider  Garlic 6834 MG CAPS Take 1,000 mg by mouth daily.   Yes [provider]  hydrocortisone-pramoxine [facility]) 2.5-1 % rectal cream Place 1 application rectally as needed. 05/31/20  Yes Derrek Monaco A, NP  ibuprofen (ADVIL) 200 MG tablet Take 200-400 mg by mouth every 6 (six) hours as needed for moderate pain or headache.   Yes [provider]  loratadine (CLARITIN) 10 MG tablet Take 10 mg by mouth daily as needed for allergies.   Yes [provider]  Omega-3 Fatty Acids (FISH OIL) 1200 MG CAPS Take 1,200 mg by mouth daily.   Yes [provider]  omeprazole (PRILOSEC) 20 MG capsule TAKE 1 CAPSULE BYMOUTH DAILY 30 MINUTES BEFORE BREAKFAST. 12/05/21  Yes Mahala Menghini, PA-C  Propylene Glycol (SYSTANE COMPLETE) 0.6 % SOLN Place 1 drop into both eyes 2 (two) times daily.   Yes [provider]    Allergies as of 01/14/2022 - Review Complete 01/14/2022  Allergen Reaction Noted   Cephalexin Hives 04/19/2015   Dexilant [dexlansoprazole]  03/08/2018   Other  02/27/2021   Penicillins Itching     Family History  Problem Relation Age of Onset   Colon cancer Paternal Grandfather  Heart attack Mother    Diabetes Mother    Diabetes Father    Heart attack Father    Alzheimer's disease Father    Colon polyps Father        not pre-cancerous     Social History   Socioeconomic History   Marital status: Married    Spouse name: Not on file   Number of children: 2   Years of education: Not on file   Highest education level: Not on file  Occupational History   Occupation: retired    Fish farm manager: [employer]: [employer]  Tobacco Use   Smoking status: Never   Smokeless tobacco:  Never  Scientific laboratory technician Use: Never used  Substance and Sexual Activity   Alcohol use: No    Alcohol/week: 0.0 standard drinks of alcohol   Drug use: No   Sexual activity: Yes    Birth control/protection: Post-menopausal, Other-see comments    Comment: vasectomy  Other Topics Concern   Not on file  Social History Narrative   Not on file   Social Determinants of Health   Financial Resource Strain: Low Risk  (05/31/2020)   Overall Financial Resource Strain (CARDIA)    Difficulty of Paying Living Expenses: Not hard at all  Food Insecurity: No Food Insecurity (05/31/2020)   Hunger Vital Sign    Worried About Running Out of Food in the Last Year: Never true    [place] in the Last Year: Never true  Transportation Needs: No Transportation Needs (05/31/2020)   PRAPARE - Hydrologist (Medical): No    Lack of Transportation (Non-Medical): No  Physical Activity: Insufficiently Active (05/31/2020)   Exercise Vital Sign    Days of Exercise per Week: 1 day    Minutes of Exercise per Session: 30 min  Stress: No Stress Concern Present (05/31/2020)   [place]    Feeling of Stress : Not at all  Social Connections: Moderately Integrated (05/31/2020)   Social Connection and Isolation Panel [NHANES]    Frequency of Communication with Friends and Family: More than three times a week    Frequency of Social Gatherings with Friends and Family: Twice a week    Attends Religious Services: More than 4 times per year    Active Member of [employer] or Organizations: No    Attends Archivist Meetings: Never    Marital Status: Married  Human resources officer Violence: Not At Risk (05/31/2020)   Humiliation, Afraid, Rape, and Kick questionnaire    Fear of Current or Ex-Partner: No    Emotionally Abused: No    Physically Abused: No    Sexually Abused: No    Review of Systems: See HPI, otherwise  negative ROS  Physical Exam: BP 135/80 (BP Location: Right Arm, Patient Position: Sitting, Cuff Size: Normal)   Pulse 93   Temp 98.6 F (37 C) (Oral)   Ht '5\' 3"'$  (1.6 m)   Wt 181 lb 6.4 oz (82.3 kg)   SpO2 98%   BMI 32.13 kg/m  General:   Alert,  Well-developed, well-nourished, pleasant and cooperative in NAD Neck:  Supple; no masses or thyromegaly. No significant cervical adenopathy. Lungs:  Clear throughout to auscultation.   No wheezes, crackles, or rhonchi. No acute distress. Heart:  Regular rate and rhythm; no murmurs, clicks, rubs,  or gallops. Abdomen: Non-distended, normal bowel sounds.  Soft  and nontender without appreciable mass or hepatosplenomegaly.  Pulses:  Normal pulses noted. Extremities:  Without clubbing or edema.  Impression/Plan: 66 year old lady with GERD and IBS-D.  Recent worsening of GERD symptoms and IBS-D.  No alarm perceived symptoms.  She does have a right upper quadrant to her pain around time of defecation which is somewhat interesting.  Does not really sound like biliary etiology.  Gallbladder previously worked up but its been over 2 decades. There is an outside chance probably a sac could be contributing to some of her symptoms.  She is on a low-dose.  Recommendations:  As discussed, lets change your antiacid medication from Prilosec to Protonix or pantoprazole 40 mg 1 tablet daily 30 minutes before breakfast (dispense 30 with 11 refills)  We will obtain a right upper quadrant ultrasound to check your gallbladder out given some right upper quadrant abdominal symptoms.  Begin a probiotic such as digestive advantage or Hardin Negus' colon health for gas bloat-take daily  Try taking 1 Imodium in the morning on the days you have multiple stools ((1-2 mg tablet)  Keep a check on your weight is gaining weight can worsen acid reflux symptoms.  Office visit here in 8 weeks.      Notice: This dictation was prepared with Dragon dictation along with smaller  phrase technology. Any transcriptional errors that result from this process are unintentional and may not be corrected upon review.

## 2022-01-14 NOTE — Patient Instructions (Signed)
It was good to see you again today!  As discussed, lets change your antiacid medication from Prilosec to Protonix or pantoprazole 40 mg 1 tablet daily 30 minutes before breakfast (dispense 30 with 11 refills)  We will obtain a right upper quadrant ultrasound to check your gallbladder out given some right upper quadrant abdominal symptoms.  Begin a probiotic such as digestive advantage or Hardin Negus' colon health for gas bloat-take daily  Try taking 1 Imodium in the morning on the days you have multiple stools ((1-2 mg tablet)  Keep a check on your weight is gaining weight can worsen acid reflux symptoms.  Office visit here in 8 weeks.

## 2022-01-21 ENCOUNTER — Ambulatory Visit (HOSPITAL_COMMUNITY)
Admission: RE | Admit: 2022-01-21 | Discharge: 2022-01-21 | Disposition: A | Discharge status: Home / Self Care | Payer: PPO | Source: Ambulatory Visit | Attending: Internal Medicine | Admitting: Internal Medicine

## 2022-01-21 DIAGNOSIS — R1011 Right upper quadrant pain: Secondary | ICD-10-CM | POA: Insufficient documentation

## 2022-01-27 ENCOUNTER — Other Ambulatory Visit: Payer: Self-pay

## 2022-01-27 DIAGNOSIS — K760 Fatty (change of) liver, not elsewhere classified: Secondary | ICD-10-CM

## 2022-01-28 DIAGNOSIS — K760 Fatty (change of) liver, not elsewhere classified: Secondary | ICD-10-CM | POA: Diagnosis not present

## 2022-01-29 LAB — HEPATIC FUNCTION PANEL
ALT: 18 IU/L (ref 0–32)
AST: 16 IU/L (ref 0–40)
Albumin: 4.7 g/dL (ref 3.9–4.9)
Alkaline Phosphatase: 85 IU/L (ref 44–121)
Bilirubin Total: 0.6 mg/dL (ref 0.0–1.2)
Bilirubin, Direct: 0.15 mg/dL (ref 0.00–0.40)
Total Protein: 7 g/dL (ref 6.0–8.5)

## 2022-01-30 ENCOUNTER — Ambulatory Visit (INDEPENDENT_AMBULATORY_CARE_PROVIDER_SITE_OTHER): Payer: PPO | Admitting: Adult Health

## 2022-01-30 ENCOUNTER — Encounter: Payer: Self-pay | Admitting: Adult Health

## 2022-01-30 ENCOUNTER — Encounter: Payer: Self-pay | Admitting: Cardiology

## 2022-01-30 ENCOUNTER — Ambulatory Visit: Payer: PPO | Attending: Cardiology | Admitting: Cardiology

## 2022-01-30 ENCOUNTER — Ambulatory Visit: Payer: PPO | Admitting: Adult Health

## 2022-01-30 VITALS — BP 113/61 | HR 78 | Ht 63.0 in | Wt 181.0 lb

## 2022-01-30 VITALS — BP 126/60 | HR 73 | Ht 63.0 in | Wt 179.2 lb

## 2022-01-30 DIAGNOSIS — Z1211 Encounter for screening for malignant neoplasm of colon: Secondary | ICD-10-CM | POA: Diagnosis not present

## 2022-01-30 DIAGNOSIS — N8189 Other female genital prolapse: Secondary | ICD-10-CM | POA: Diagnosis not present

## 2022-01-30 DIAGNOSIS — Z01419 Encounter for gynecological examination (general) (routine) without abnormal findings: Secondary | ICD-10-CM

## 2022-01-30 DIAGNOSIS — Z780 Asymptomatic menopausal state: Secondary | ICD-10-CM

## 2022-01-30 DIAGNOSIS — I471 Supraventricular tachycardia, unspecified: Secondary | ICD-10-CM | POA: Diagnosis not present

## 2022-01-30 DIAGNOSIS — R002 Palpitations: Secondary | ICD-10-CM | POA: Diagnosis not present

## 2022-01-30 LAB — HEMOCCULT GUIAC POC 1CARD (OFFICE): Fecal Occult Blood, POC: NEGATIVE

## 2022-01-30 NOTE — Patient Instructions (Signed)
Medication Instructions:  Your physician recommends that you continue on your current medications as directed. Please refer to the Current Medication list given to you today.   Labwork: None today  Testing/Procedures: None today  Follow-Up: 1 year  Any Other Special Instructions Will Be Listed Below (If Applicable).  If you need a refill on your cardiac medications before your next appointment, please call your pharmacy.  

## 2022-01-30 NOTE — Progress Notes (Signed)
Patient ID: Ruth Warner, female   DOB: [birth year], 66 y.o.   MRN: [id number] History of Present Illness: Ruth Warner is a 66 year old white female, married, PM in for a well woman gyn exam.   Last pap was negative HPV and malignancy 05/31/20.  PCP is Dr Gerarda Fraction.    Current Medications, Allergies, Past Medical History, Past Surgical History, Family History and Social History were reviewed in [place] record.     Review of Systems: Patient denies any headaches, hearing loss, fatigue, blurred vision, shortness of breath, chest pain, abdominal pain, problems with bowel movements,  or intercourse. No joint pain or mood swings.  She says she feels like she dribbles when she pees at times.   Physical Exam:BP 113/61 (BP Location: Left Arm, Patient Position: Sitting, Cuff Size: Normal)   Pulse 78   Ht '5\' 3"'$  (1.6 m)   Wt 181 lb (82.1 kg)   BMI 32.06 kg/m   General:  Well developed, well nourished, no acute distress Skin:  Warm and dry Neck:  Midline trachea, normal thyroid, good ROM, no lymphadenopathy,no carotid bruits heard Lungs; Clear to auscultation bilaterally Breast:  No dominant palpable mass, retraction, or nipple discharge Cardiovascular: Regular rate and rhythm Abdomen:  Soft, non tender, no hepatosplenomegaly Pelvic:  External genitalia is normal in appearance, no lesions.  The vagina is pale, +relaxation, mild cystocele.Marland Kitchen Urethra has no lesions or masses. The cervix is smooth.  Uterus is felt to be normal size, shape, and contour.  No adnexal masses or tenderness noted.Bladder is non tender, no masses felt. Rectal: Good sphincter tone, no polyps, or hemorrhoids felt.  Hemoccult negative. Extremities/musculoskeletal:  No swelling or varicosities noted, no clubbing or cyanosis Psych:  No mood changes, alert and cooperative,seems happy AA is 0 Fall risk is low    01/30/2022    1:34 PM 05/31/2020    8:53 AM 10/13/2018    8:45 AM  Depression screen PHQ 2/9   Decreased Interest 0 0 0  Down, Depressed, Hopeless 0 0 0  PHQ - 2 Score 0 0 0  Altered sleeping 0 0 0  Tired, decreased energy 0 0 0  Change in appetite 0 0 0  Feeling bad or failure about yourself  0 0 0  Trouble concentrating 0 0 0  Moving slowly or fidgety/restless 0 0 0  Suicidal thoughts 0 0 0  PHQ-9 Score 0 0 0       01/30/2022    1:34 PM 05/31/2020    8:53 AM  GAD 7 : Generalized Anxiety Score  Nervous, Anxious, on Edge 0 0  Control/stop worrying 0 0  Worry too much - different things 0 0  Trouble relaxing 0 0  Restless 0 0  Easily annoyed or irritable 0 0  Afraid - awful might happen 0 0  Total GAD 7 Score 0 0    Upstream - 01/30/22 1333       Pregnancy Intention Screening   Does the patient want to become pregnant in the next year? No    Does the patient's partner want to become pregnant in the next year? No    Would the patient like to discuss contraceptive options today? No      Contraception Wrap Up   Current Method Vasectomy    End Method Vasectomy    Contraception Counseling Provided No            Examination chaperoned by Levy Pupa LPN  Impression and Plan: 1. Encounter  for well woman exam with routine gynecological exam Physical in 1 year Had negative mammogram 12/29/21 Colonoscopy per GI Continue to be active.  2. Pelvic relaxation  3. Encounter for screening fecal occult blood testing Hemoccult was negative   4. Post-menopause Will get DEXA 02/03/22 at [facility] 02/03/22 at 9 am

## 2022-01-30 NOTE — Progress Notes (Signed)
Cardiology Office Note  Date: 01/30/2022   ID: Ruth Warner, DOB 05/08/1955, MRN [id number]  PCP:  Redmond School, MD  Cardiologist:  Rozann Lesches, MD Electrophysiologist:  None   Chief Complaint  Patient presents with   Cardiac follow-up    History of Present Illness: Ruth Warner is a 66 y.o. female last seen in April by Ms. Strader PA-C, I reviewed the note.  She is here for a routine visit.  Reports only brief episodes of palpitations, nothing prolonged or associated with dizziness or syncope.  We did go over her cardiac monitor from earlier in the year which did capture episodes of PSVT and aberrantly conducted PSVT.  These were generally brief, the longest was only 16 seconds.  For now she remains comfortable with observation.  I discussed addition of low-dose beta-blocker if symptoms worsen however.  Past Medical History:  Diagnosis Date   Abdominal pain, chronic, epigastric    Chronic headaches    GERD (gastroesophageal reflux disease)    History of concussion    Staples, hit a shelf   IBS (irritable bowel syndrome)    Normal cardiac stress test    February 2009 and May 2016 ([facility])   Palpitations    Pelvic relaxation 2015   Rectocele 2015   S/P colonoscopy 2001   Dr. Gala Romney: normal. Repeat 10 years   S/P endoscopy 2001   Dr. Gala Romney: normal    Past Surgical History:  Procedure Laterality Date   COLONOSCOPY  07/30/2010   Rourk: Single diminutive hyperplastic polyp in the mid descending colon status post cold biopsy removal   COLONOSCOPY N/A 03/06/2021   Procedure: COLONOSCOPY;  Surgeon: Daneil Dolin, MD;  [facility];  Service: Endoscopy;  Laterality: N/A;  8:30am   ESOPHAGOGASTRODUODENOSCOPY N/A 02/12/2015   [id number] EGD   ESOPHAGOGASTRODUODENOSCOPY ENDOSCOPY     With RMR, unable to remember date.   POLYPECTOMY  03/06/2021   Procedure: POLYPECTOMY;  Surgeon: Daneil Dolin, MD;  [facility];  Service: Endoscopy;;    TONSILLECTOMY      Current Outpatient Medications  Medication Sig Dispense Refill   Garlic 6644 MG CAPS Take 1,000 mg by mouth daily.     hydrocortisone-pramoxine (ANALPRAM-HC) 2.5-1 % rectal cream Place 1 application rectally as needed. 30 g 3   ibuprofen (ADVIL) 200 MG tablet Take 200-400 mg by mouth every 6 (six) hours as needed for moderate pain or headache.     loratadine (CLARITIN) 10 MG tablet Take 10 mg by mouth daily as needed for allergies.     Omega-3 Fatty Acids (FISH OIL) 1200 MG CAPS Take 1,200 mg by mouth daily.     pantoprazole (PROTONIX) 40 MG tablet Take 1 tablet (40 mg total) by mouth daily. 30 tablet 11   Propylene Glycol (SYSTANE COMPLETE) 0.6 % SOLN Place 1 drop into both eyes 2 (two) times daily.     pyridOXINE (VITAMIN B6) 25 MG tablet Take 25 mg by mouth daily.     vitamin B-12 (CYANOCOBALAMIN) 100 MCG tablet Take 100 mcg by mouth daily.     No current facility-administered medications for this visit.   Allergies:  Cephalexin, Dexilant [dexlansoprazole], Other, and Penicillins   ROS: No syncope.  Physical Exam: VS:  BP 126/60   Pulse 73   Ht '5\' 3"'$  (1.6 m)   Wt 179 lb 3.2 oz (81.3 kg)   SpO2 97%   BMI 31.74 kg/m , BMI Body mass index is 31.74 kg/m.  Wt Readings  from Last 3 Encounters:  01/30/22 179 lb 3.2 oz (81.3 kg)  01/14/22 181 lb 6.4 oz (82.3 kg)  07/09/21 179 lb (81.2 kg)    General: Patient appears comfortable at rest. HEENT: Conjunctiva and lids normal. Neck: Supple, no elevated JVP or carotid bruits. Lungs: Clear to auscultation, nonlabored breathing at rest. Cardiac: Regular rate and rhythm, no S3 or significant systolic murmur.  ECG:  An ECG dated 07/09/2021  was personally reviewed today and demonstrated:  Sinus rhythm.  Recent Labwork: 01/28/2022: ALT 18; AST 16     Component Value Date/Time   CHOL 165 10/13/2018 0955   TRIG 100 10/13/2018 0955   HDL 46 10/13/2018 0955   CHOLHDL 3.6 10/13/2018 0955   CHOLHDL 3.3 05/01/2007 0420    VLDL 6 05/01/2007 0420   LDLCALC 99 10/13/2018 0955    Other Studies Reviewed Today:  Echocardiogram 05/30/2019:  1. Left ventricular ejection fraction, by estimation, is 65 to 70%. The  left ventricle has normal function. The left ventricle has no regional  wall motion abnormalities. Left ventricular diastolic parameters were  normal.   2. Right ventricular systolic function is normal. The right ventricular  size is normal. Tricuspid regurgitation signal is inadequate for assessing  PA pressure.   3. The mitral valve is grossly normal. Trivial mitral valve  regurgitation.   4. The aortic valve is tricuspid. Aortic valve regurgitation is not  visualized.   5. The inferior vena cava is normal in size with greater than 50%  respiratory variability, suggesting right atrial pressure of 3 mmHg.   Cardiac monitor May 23: ZIO XT reviewed.  13 days, 22 hours analyzed.  Predominant rhythm is sinus with heart rate ranging from 48 bpm up to 135 bpm and average heart rate 69 bpm.  There were rare PACs including atrial couplets and triplets representing less than 1% total beats.  There were also rare PVCs including ventricular couplets representing less than 1% total beats.  There were 12 episodes of PSVT noted, the longest of which lasted for approximately 16 seconds.  There was also a single run of NSVT versus aberrantly conducted SVT.  Some loose correlation between patient triggered events and brief PSVT episodes noted.  There were no pauses.   Assessment and Plan:  Palpitations with documented brief episodes of PSVT.  Plan to continue with observation at this point.  If she has increasing symptoms would add low-dose Toprol-XL.  Medication Adjustments/Labs and Tests Ordered: Current medicines are reviewed at length with the patient today.  Concerns regarding medicines are outlined above.   Tests Ordered: No orders of the defined types were placed in this encounter.   Medication Changes: No  orders of the defined types were placed in this encounter.   Disposition:  Follow up  1 year, sooner if needed.  Signed, Satira Sark, MD, [facility] 01/30/2022 12:00 PM    [place] [facility] [place]. [address], [place], [place] [address] Phone: [phone]; Fax: [phone]

## 2022-02-03 ENCOUNTER — Ambulatory Visit (HOSPITAL_COMMUNITY)
Admission: RE | Admit: 2022-02-03 | Discharge: 2022-02-03 | Disposition: A | Discharge status: Home / Self Care | Payer: PPO | Source: Ambulatory Visit | Attending: Adult Health | Admitting: Adult Health

## 2022-02-03 ENCOUNTER — Encounter: Payer: Self-pay | Admitting: Adult Health

## 2022-02-03 DIAGNOSIS — Z780 Asymptomatic menopausal state: Secondary | ICD-10-CM | POA: Insufficient documentation

## 2022-02-03 DIAGNOSIS — M85852 Other specified disorders of bone density and structure, left thigh: Secondary | ICD-10-CM | POA: Diagnosis not present

## 2022-03-03 DIAGNOSIS — U071 COVID-19: Secondary | ICD-10-CM | POA: Diagnosis not present

## 2022-03-10 ENCOUNTER — Encounter: Payer: Self-pay | Admitting: Gastroenterology

## 2022-03-10 ENCOUNTER — Ambulatory Visit (INDEPENDENT_AMBULATORY_CARE_PROVIDER_SITE_OTHER): Payer: PPO | Admitting: Gastroenterology

## 2022-03-10 VITALS — BP 119/78 | HR 88 | Temp 99.6°F | Ht 63.0 in | Wt 178.8 lb

## 2022-03-10 DIAGNOSIS — K219 Gastro-esophageal reflux disease without esophagitis: Secondary | ICD-10-CM | POA: Diagnosis not present

## 2022-03-10 DIAGNOSIS — K580 Irritable bowel syndrome with diarrhea: Secondary | ICD-10-CM

## 2022-03-10 NOTE — Patient Instructions (Signed)
Continue pantoprazole '40mg'$  daily before breakfast for now until your stomach settles down. Check in with me in a couple of weeks and let me know how you are feeling. In the future, if you want to tray to wean off pantoprazole, we can write you a new prescription for '20mg'$  tablets so you can slowly wean from '40mg'$  to '20mg'$  before trying to stop.     Gastroesophageal Reflux Disease, Adult Gastroesophageal reflux (GER) happens when acid from the stomach flows up into the tube that connects the mouth and the stomach (esophagus). Normally, food travels down the esophagus and stays in the stomach to be digested. However, when a person has GER, food and stomach acid sometimes move back up into the esophagus. If this becomes a more serious problem, the person may be diagnosed with a disease called gastroesophageal reflux disease (GERD). GERD occurs when the reflux: Happens often. Causes frequent or severe symptoms. Causes problems such as damage to the esophagus. When stomach acid comes in contact with the esophagus, the acid may cause inflammation in the esophagus. Over time, GERD may create small holes (ulcers) in the lining of the esophagus. What are the causes? This condition is caused by a problem with the muscle between the esophagus and the stomach (lower esophageal sphincter, or LES). Normally, the LES muscle closes after food passes through the esophagus to the stomach. When the LES is weakened or abnormal, it does not close properly, and that allows food and stomach acid to go back up into the esophagus. The LES can be weakened by certain dietary substances, medicines, and medical conditions, including: Tobacco use. Pregnancy. Having a hiatal hernia. Alcohol use. Certain foods and beverages, such as coffee, chocolate, onions, and peppermint. What increases the risk? You are more likely to develop this condition if you: Have an increased body weight. Have a connective tissue disorder. Take  NSAIDs, such as ibuprofen. What are the signs or symptoms? Symptoms of this condition include: Heartburn. Difficult or painful swallowing and the feeling of having a lump in the throat. A bitter taste in the mouth. Bad breath and having a large amount of saliva. Having an upset or bloated stomach and belching. Chest pain. Different conditions can cause chest pain. Make sure you see your health care provider if you experience chest pain. Shortness of breath or wheezing. Ongoing (chronic) cough or a nighttime cough. Wearing away of tooth enamel. Weight loss. How is this diagnosed? This condition may be diagnosed based on a medical history and a physical exam. To determine if you have mild or severe GERD, your health care provider may also monitor how you respond to treatment. You may also have tests, including: A test to examine your stomach and esophagus with a small camera (endoscopy). A test that measures the acidity level in your esophagus. A test that measures how much pressure is on your esophagus. A barium swallow or modified barium swallow test to show the shape, size, and functioning of your esophagus. How is this treated? Treatment for this condition may vary depending on how severe your symptoms are. Your health care provider may recommend: Changes to your diet. Medicine. Surgery. The goal of treatment is to help relieve your symptoms and to prevent complications. Follow these instructions at home: Eating and drinking  Follow a diet as recommended by your health care provider. This may involve avoiding foods and drinks such as: Coffee and tea, with or without caffeine. Drinks that contain alcohol. Energy drinks and sports drinks. Carbonated  drinks or sodas. Chocolate and cocoa. Peppermint and mint flavorings. Garlic and onions. Horseradish. Spicy and acidic foods, including peppers, chili powder, curry powder, vinegar, hot sauces, and barbecue sauce. Citrus fruit juices  and citrus fruits, such as oranges, lemons, and limes. Tomato-based foods, such as red sauce, chili, salsa, and pizza with red sauce. Fried and fatty foods, such as donuts, french fries, potato chips, and high-fat dressings. High-fat meats, such as hot dogs and fatty cuts of red and white meats, such as rib eye steak, sausage, ham, and bacon. High-fat dairy items, such as whole milk, butter, and cream cheese. Eat small, frequent meals instead of large meals. Avoid drinking large amounts of liquid with your meals. Avoid eating meals during the 2-3 hours before bedtime. Avoid lying down right after you eat. Do not exercise right after you eat. Lifestyle  Do not use any products that contain nicotine or tobacco. These products include cigarettes, chewing tobacco, and vaping devices, such as e-cigarettes. If you need help quitting, ask your health care provider. Try to reduce your stress by using methods such as yoga or meditation. If you need help reducing stress, ask your health care provider. If you are overweight, reduce your weight to an amount that is healthy for you. Ask your health care provider for guidance about a safe weight loss goal. General instructions Pay attention to any changes in your symptoms. Take over-the-counter and prescription medicines only as told by your health care provider. Do not take aspirin, ibuprofen, or other NSAIDs unless your health care provider told you to take these medicines. Wear loose-fitting clothing. Do not wear anything tight around your waist that causes pressure on your abdomen. Raise (elevate) the head of your bed about 6 inches (15 cm). You can use a wedge to do this. Avoid bending over if this makes your symptoms worse. Keep all follow-up visits. This is important. Contact a health care provider if: You have: New symptoms. Unexplained weight loss. Difficulty swallowing or it hurts to swallow. Wheezing or a persistent cough. A hoarse  voice. Your symptoms do not improve with treatment. Get help right away if: You have sudden pain in your arms, neck, jaw, teeth, or back. You suddenly feel sweaty, dizzy, or light-headed. You have chest pain or shortness of breath. You vomit and the vomit is green, yellow, or black, or it looks like blood or coffee grounds. You faint. You have stool that is red, bloody, or black. You cannot swallow, drink, or eat. These symptoms may represent a serious problem that is an emergency. Do not wait to see if the symptoms will go away. Get medical help right away. Call your local emergency services (911 in the [place] Do not drive yourself to the hospital. Summary Gastroesophageal reflux happens when acid from the stomach flows up into the esophagus. GERD is a disease in which the reflux happens often, causes frequent or severe symptoms, or causes problems such as damage to the esophagus. Treatment for this condition may vary depending on how severe your symptoms are. Your health care provider may recommend diet and lifestyle changes, medicine, or surgery. Contact a health care provider if you have new or worsening symptoms. Take over-the-counter and prescription medicines only as told by your health care provider. Do not take aspirin, ibuprofen, or other NSAIDs unless your health care provider told you to do so. Keep all follow-up visits as told by your health care provider. This is important. This information is not intended to replace advice  given to you by your health care provider. Make sure you discuss any questions you have with your health care provider. Document Revised: 09/19/2019 Document Reviewed: 09/19/2019 Elsevier Patient Education  [place] for Gastroesophageal Reflux Disease, Adult When you have gastroesophageal reflux disease (GERD), the foods you eat and your eating habits are very important. Choosing the right foods can help ease the discomfort of GERD.  Consider working with a dietitian to help you make healthy food choices. What are tips for following this plan? Reading food labels Look for foods that are low in saturated fat. Foods that have less than 5% of daily value (DV) of fat and 0 g of trans fats may help with your symptoms. Cooking Cook foods using methods other than frying. This may include baking, steaming, grilling, or broiling. These are all methods that do not need a lot of fat for cooking. To add flavor, try to use herbs that are low in spice and acidity. Meal planning  Choose healthy foods that are low in fat, such as fruits, vegetables, whole grains, low-fat dairy products, lean meats, fish, and poultry. Eat frequent, small meals instead of three large meals each day. Eat your meals slowly, in a relaxed setting. Avoid bending over or lying down until 2-3 hours after eating. Limit high-fat foods such as fatty meats or fried foods. Limit your intake of fatty foods, such as oils, butter, and shortening. Avoid the following as told by your health care provider: Foods that cause symptoms. These may be different for different people. Keep a food diary to keep track of foods that cause symptoms. Alcohol. Drinking large amounts of liquid with meals. Eating meals during the 2-3 hours before bed. Lifestyle Maintain a healthy weight. Ask your health care provider what weight is healthy for you. If you need to lose weight, work with your health care provider to do so safely. Exercise for at least 30 minutes on 5 or more days each week, or as told by your health care provider. Avoid wearing clothes that fit tightly around your waist and chest. Do not use any products that contain nicotine or tobacco. These products include cigarettes, chewing tobacco, and vaping devices, such as e-cigarettes. If you need help quitting, ask your health care provider. Sleep with the head of your bed raised. Use a wedge under the mattress or blocks under the  bed frame to raise the head of the bed. Chew sugar-free gum after mealtimes. What foods should I eat?  Eat a healthy, well-balanced diet of fruits, vegetables, whole grains, low-fat dairy products, lean meats, fish, and poultry. Each person is different. Foods that may trigger symptoms in one person may not trigger any symptoms in another person. Work with your health care provider to identify foods that are safe for you. The items listed above may not be a complete list of recommended foods and beverages. Contact a dietitian for more information. What foods should I avoid? Limiting some of these foods may help manage the symptoms of GERD. Everyone is different. Consult a dietitian or your health care provider to help you identify the exact foods to avoid, if any. Fruits Any fruits prepared with added fat. Any fruits that cause symptoms. For some people this may include citrus fruits, such as oranges, grapefruit, pineapple, and lemons. Vegetables Deep-fried vegetables. [place] fries. Any vegetables prepared with added fat. Any vegetables that cause symptoms. For some people, this may include tomatoes and tomato products, chili peppers, onions  and garlic, and horseradish. Grains Pastries or quick breads with added fat. Meats and other proteins High-fat meats, such as fatty beef or pork, hot dogs, ribs, ham, sausage, salami, and bacon. Fried meat or protein, including fried fish and fried chicken. Nuts and nut butters, in large amounts. Dairy Whole milk and chocolate milk. Sour cream. Cream. Ice cream. Cream cheese. Milkshakes. Fats and oils Butter. Margarine. Shortening. Ghee. Beverages Coffee and tea, with or without caffeine. Carbonated beverages. Sodas. Energy drinks. Fruit juice made with acidic fruits, such as orange or grapefruit. Tomato juice. Alcoholic drinks. Sweets and desserts Chocolate and cocoa. Donuts. Seasonings and condiments Pepper. Peppermint and spearmint. Added salt. Any  condiments, herbs, or seasonings that cause symptoms. For some people, this may include curry, hot sauce, or vinegar-based salad dressings. The items listed above may not be a complete list of foods and beverages to avoid. Contact a dietitian for more information. Questions to ask your health care provider Diet and lifestyle changes are usually the first steps that are taken to manage symptoms of GERD. If diet and lifestyle changes do not improve your symptoms, talk with your health care provider about taking medicines. Where to find more information [employer] for Gastrointestinal Disorders: aboutgerd.org Summary When you have gastroesophageal reflux disease (GERD), food and lifestyle choices may be very helpful in easing the discomfort of GERD. Eat frequent, small meals instead of three large meals each day. Eat your meals slowly, in a relaxed setting. Avoid bending over or lying down until 2-3 hours after eating. Limit high-fat foods such as fatty meats or fried foods. This information is not intended to replace advice given to you by your health care provider. Make sure you discuss any questions you have with your health care provider. Document Revised: 09/19/2019 Document Reviewed: 09/19/2019 Elsevier Patient Education  [place].

## 2022-03-10 NOTE — Progress Notes (Signed)
GI Office Note    Referring Provider: Redmond School, MD Primary Care Physician:  Redmond School, MD  Primary Gastroenterologist: Garfield Cornea, MD   Chief Complaint   Chief Complaint  Patient presents with   Follow-up    States that she has a feeling in her stomach since starting pantoprazole, doesn't feel like it is as good as the prilosec.     History of Present Illness   Ruth Warner is a 66 y.o. female presenting today for follow up. Last seen in 12/2021. H/o GERD, IBS-D. Up to date on colonoscopy, completed 2022 (one hyperplastic polyp). EGD 01/2015 normal.  At last ov, her omeprazole was switched to pantoprazole for outside chance it was contributing to her upper abdominal discomfort that she had been having for a couple of decades. She had been on omeprazole for years. Previously she did not tolerate Dexilant due to swelling and feeling bad.   Dealing with a lot since last visit. She had PNA, treated with antibiotics. Then got COVID, took Paxlovid. Finally starting to feel better. While having Covid, she missed her pantoprazole for four days. Just started back on medications six days ago. She is not sure if pantoprazole is helping or causing a weird sensation in her stomach. Hard to tell given recent illnesses and antibiotic use.   She has a discomfort in her upper abdomen, not quite like a hunger pain but close. Present all the time. No nausea. Feels like she has to eat all the time. Since starting back on PPI, burning in the upper abdomen has improved. Her bowels usually fluctuate, can have 3 stools per day, ranging from solid to loose. Stools don't seem as loose as they did before. Can have pretty normal stools for couple of weeks in a row. No melena, brbpr.    Right upper quadrant ultrasound January 21, 2022: Gallbladder unremarkable.  Likely fatty liver.  Labs from November 2023: LFTs normal.  Stool Hemoccult negative.  Medications   Current Outpatient  Medications  Medication Sig Dispense Refill   CALCIUM PO Take by mouth.     Cholecalciferol (VITAMIN D-3 PO) Take by mouth.     Garlic 4128 MG CAPS Take 1,000 mg by mouth daily.     hydrocortisone-pramoxine (ANALPRAM-HC) 2.5-1 % rectal cream Place 1 application rectally as needed. 30 g 3   ibuprofen (ADVIL) 200 MG tablet Take 200-400 mg by mouth every 6 (six) hours as needed for moderate pain or headache.     loratadine (CLARITIN) 10 MG tablet Take 10 mg by mouth daily as needed for allergies.     Multiple Vitamin (MULTIVITAMIN) tablet Take 1 tablet by mouth daily.     Omega-3 Fatty Acids (FISH OIL) 1200 MG CAPS Take 1,200 mg by mouth daily.     pantoprazole (PROTONIX) 40 MG tablet Take 1 tablet (40 mg total) by mouth daily. 30 tablet 11   Propylene Glycol (SYSTANE COMPLETE) 0.6 % SOLN Place 1 drop into both eyes 2 (two) times daily.     No current facility-administered medications for this visit.    Allergies   Allergies as of 03/10/2022 - Review Complete 03/10/2022  Allergen Reaction Noted   Cephalexin Hives 04/19/2015   Dexilant [dexlansoprazole]  03/08/2018   Other  02/27/2021   Penicillins Itching       Review of Systems   General: Negative for anorexia, weight loss, fever, chills, fatigue, weakness. ENT: Negative for hoarseness, difficulty swallowing , nasal congestion. CV: Negative for chest pain,  angina, palpitations, dyspnea on exertion, peripheral edema.  Respiratory: Negative for dyspnea at rest, dyspnea on exertion, cough, sputum, wheezing.  GI: See history of present illness. GU:  Negative for dysuria, hematuria, urinary incontinence, urinary frequency, nocturnal urination.  Endo: Negative for unusual weight change.     Physical Exam   BP 119/78 (BP Location: Right Arm, Patient Position: Sitting, Cuff Size: Large)   Pulse 88   Temp 99.6 F (37.6 C) (Oral)   Ht '5\' 3"'$  (1.6 m)   Wt 178 lb 12.8 oz (81.1 kg)   SpO2 98%   BMI 31.67 kg/m    General:  Well-nourished, well-developed in no acute distress.  Eyes: No icterus. Mouth: Oropharyngeal mucosa moist and pink  Abdomen: Bowel sounds are normal, nontender, nondistended, no hepatosplenomegaly or masses,  no abdominal bruits or hernia , no rebound or guarding.  Rectal: not performed Extremities: No lower extremity edema. No clubbing or deformities. Neuro: Alert and oriented x 4   Skin: Warm and dry, no jaundice.   Psych: Alert and cooperative, normal mood and affect.  Labs   See above  Imaging Studies   No results found.  Assessment   GERD: recent switch from omeprazole to pantoprazole due to upper abdominal discomfort to rule out possibility of medication related symptoms. Unfortunately she has been ill with PNA and then Covid and has not really had time to decide if the change was helpful. Actually missing medication while ill. Reinforced antireflux measures. Will have her continue pantoprazole for awhile longer to see if helpful.   IBS-D: stable, baseline.   PLAN   Continue pantoprazole '40mg'$  daily before breakfast for now to see if stomach issues settle down. She is interested in trying to wean off PPIs in the future but would refrain until she feels better. She can call in a couple of weeks with progress report.  Reinforced antireflux measures.    Laureen Ochs. Bobby Rumpf, [place], [facility]

## 2022-04-02 DIAGNOSIS — L821 Other seborrheic keratosis: Secondary | ICD-10-CM | POA: Diagnosis not present

## 2022-04-02 DIAGNOSIS — Z1283 Encounter for screening for malignant neoplasm of skin: Secondary | ICD-10-CM | POA: Diagnosis not present

## 2022-04-02 DIAGNOSIS — D225 Melanocytic nevi of trunk: Secondary | ICD-10-CM | POA: Diagnosis not present

## 2022-04-02 DIAGNOSIS — L270 Generalized skin eruption due to drugs and medicaments taken internally: Secondary | ICD-10-CM | POA: Diagnosis not present

## 2022-04-30 ENCOUNTER — Encounter: Payer: Self-pay | Admitting: Gastroenterology

## 2022-05-05 DIAGNOSIS — X32XXXA Exposure to sunlight, initial encounter: Secondary | ICD-10-CM | POA: Diagnosis not present

## 2022-05-05 DIAGNOSIS — L570 Actinic keratosis: Secondary | ICD-10-CM | POA: Diagnosis not present

## 2022-05-05 DIAGNOSIS — L308 Other specified dermatitis: Secondary | ICD-10-CM | POA: Diagnosis not present

## 2022-07-09 DIAGNOSIS — E6609 Other obesity due to excess calories: Secondary | ICD-10-CM | POA: Diagnosis not present

## 2022-07-09 DIAGNOSIS — Z6831 Body mass index (BMI) 31.0-31.9, adult: Secondary | ICD-10-CM | POA: Diagnosis not present

## 2022-07-09 DIAGNOSIS — H6501 Acute serous otitis media, right ear: Secondary | ICD-10-CM | POA: Diagnosis not present

## 2022-07-09 DIAGNOSIS — J302 Other seasonal allergic rhinitis: Secondary | ICD-10-CM | POA: Diagnosis not present

## 2022-08-01 ENCOUNTER — Encounter: Payer: Self-pay | Admitting: Gastroenterology

## 2022-08-01 ENCOUNTER — Ambulatory Visit (INDEPENDENT_AMBULATORY_CARE_PROVIDER_SITE_OTHER): Payer: PPO | Admitting: Gastroenterology

## 2022-08-01 VITALS — BP 130/85 | HR 80 | Temp 98.2°F | Ht 63.0 in | Wt 179.2 lb

## 2022-08-01 DIAGNOSIS — R1013 Epigastric pain: Secondary | ICD-10-CM

## 2022-08-01 DIAGNOSIS — K589 Irritable bowel syndrome without diarrhea: Secondary | ICD-10-CM | POA: Diagnosis not present

## 2022-08-01 DIAGNOSIS — K219 Gastro-esophageal reflux disease without esophagitis: Secondary | ICD-10-CM | POA: Diagnosis not present

## 2022-08-01 DIAGNOSIS — R1011 Right upper quadrant pain: Secondary | ICD-10-CM

## 2022-08-01 NOTE — Patient Instructions (Signed)
Continue pantoprazole 40mg  daily before breakfast. Add IBgard two capsule one to two times daily for bloating/pain.  Upper endoscopy to be scheduled.   It was a pleasure to see you today. I want to create trusting relationships with patients and provide genuine, compassionate, and quality care. I truly value your feedback, so please be on the lookout for a survey regarding your visit with me today. I appreciate your time in completing this!

## 2022-08-01 NOTE — Progress Notes (Signed)
GI Office Note    Referring Provider: Elfredia Nevins, MD Primary Care Physician:  Elfredia Nevins, MD  Primary Gastroenterologist: Roetta Sessions, MD   Chief Complaint   Chief Complaint  Patient presents with   Follow-up    States that she is having a lot of abdominal pain    History of Present Illness   Ruth Warner is a 67 y.o. female presenting today for follow up. Last seen 12/2021. H/O GERD, IBS-D.  Up-to-date on colonoscopy, completed 2022 (1 hyperplastic polyp).  EGD November 2016 normal.  On omeprazole for years, but when she was complaining of constant upper abdominal pain last year, for an outside chance that was contributing, she was switched to pantoprazole.  Previously did not tolerate Dexilant due to swelling and feeling bad. Right upper quadrant ultrasound January 21, 2022: Gallbladder unremarkable.  Likely fatty liver. Labs from November 2023: LFTs normal.  Stool Hemoccult negative.  Today: since her last visit, she slowly weaned herself down on pantoprazole. She was down to taking only twice per week. Doing very well. No issues with abdominal pain. About a month later, she was camping and strayed from her normal diet a bit. She started having ruq pain with some radiation into the back. Also noted in epigastric region. No N/V. She started back on PPI daily. Cut back on her sugar, bread, and feels some better. She also has some nausea/burping but no vomiting. She has had some regurgitation about once per week. No dysphagia. Rare heartburn, if she has any she will take TUMS. She notes that she has used liquid aloe but at time of onset of abdominal pain, she laid off this as well as several of her nonessential vitamins. BM regular. [place] four mostly. 2 per day. No melena, brbpr.   Medications   Current Outpatient Medications  Medication Sig Dispense Refill   CALCIUM PO Take by mouth.     Cholecalciferol (VITAMIN D-3 PO) Take by mouth.     Garlic 1000 MG CAPS Take  1,000 mg by mouth daily.     hydrocortisone-pramoxine (ANALPRAM-HC) 2.5-1 % rectal cream Place 1 application rectally as needed. 30 g 3   ibuprofen (ADVIL) 200 MG tablet Take 200-400 mg by mouth every 6 (six) hours as needed for moderate pain or headache.     loratadine (CLARITIN) 10 MG tablet Take 10 mg by mouth daily as needed for allergies.     Omega-3 Fatty Acids (FISH OIL) 1200 MG CAPS Take 1,200 mg by mouth daily.     pantoprazole (PROTONIX) 40 MG tablet Take 1 tablet (40 mg total) by mouth daily. 30 tablet 11   Propylene Glycol (SYSTANE COMPLETE) 0.6 % SOLN Place 1 drop into both eyes 2 (two) times daily.     No current facility-administered medications for this visit.    Allergies   Allergies as of 08/01/2022 - Review Complete 08/01/2022  Allergen Reaction Noted   Cephalexin Hives 04/19/2015   Dexilant [dexlansoprazole]  03/08/2018   Other  02/27/2021   Penicillins Itching      Past Medical History   Past Medical History:  Diagnosis Date   Abdominal pain, chronic, epigastric    Chronic headaches    GERD (gastroesophageal reflux disease)    History of concussion    Staples, hit a shelf   IBS (irritable bowel syndrome)    Normal cardiac stress test    February 2009 and May 2016 ([facility])   Palpitations    Pelvic relaxation 2015  Rectocele 2015   S/P colonoscopy 2001   Dr. Jena Gauss: normal. Repeat 10 years   S/P endoscopy 2001   Dr. Jena Gauss: normal    Past Surgical History   Past Surgical History:  Procedure Laterality Date   COLONOSCOPY  07/30/2010   Rourk: Single diminutive hyperplastic polyp in the mid descending colon status post cold biopsy removal   COLONOSCOPY N/A 03/06/2021   Procedure: COLONOSCOPY;  Surgeon: Corbin Ade, MD;  [facility]: [facility];  Service: Endoscopy;  Laterality: N/A;  8:30am   ESOPHAGOGASTRODUODENOSCOPY N/A 02/12/2015   [id number] EGD   ESOPHAGOGASTRODUODENOSCOPY ENDOSCOPY     With RMR, unable to remember date.   POLYPECTOMY   03/06/2021   Procedure: POLYPECTOMY;  Surgeon: Corbin Ade, MD;  Location: [facility];  Service: Endoscopy;;   TONSILLECTOMY      Past Family History   Family History  Problem Relation Age of Onset   Colon cancer Paternal Grandfather    Heart attack Mother    Diabetes Mother    Diabetes Father    Heart attack Father    Alzheimer's disease Father    Colon polyps Father        not pre-cancerous     Past Social History   Social History   Socioeconomic History   Marital status: Married    Spouse name: Not on file   Number of children: 2   Years of education: Not on file   Highest education level: Not on file  Occupational History   Occupation: retired    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: [employer]  Tobacco Use   Smoking status: Never   Smokeless tobacco: Never  Building services engineer Use: Never used  Substance and Sexual Activity   Alcohol use: No    Alcohol/week: 0.0 standard drinks of alcohol   Drug use: No   Sexual activity: Yes    Birth control/protection: Post-menopausal, Other-see comments    Comment: vasectomy  Other Topics Concern   Not on file  Social History Narrative   Not on file   Social Determinants of Health   Financial Resource Strain: Low Risk  (01/30/2022)   Overall Financial Resource Strain (CARDIA)    Difficulty of Paying Living Expenses: Not hard at all  Food Insecurity: No Food Insecurity (01/30/2022)   Hunger Vital Sign    Worried About Running Out of Food in the Last Year: Never true    Ran Out of Food in the Last Year: Never true  Transportation Needs: No Transportation Needs (01/30/2022)   PRAPARE - Administrator, Civil Service (Medical): No    Lack of Transportation (Non-Medical): No  Physical Activity: Inactive (01/30/2022)   Exercise Vital Sign    Days of Exercise per Week: 0 days    Minutes of Exercise per Session: 0 min  Stress: No Stress Concern Present (01/30/2022)    [employer] of Occupational Health - Occupational Stress Questionnaire    Feeling of Stress : Not at all  Social Connections: Moderately Integrated (01/30/2022)   Social Connection and Isolation Panel [NHANES]    Frequency of Communication with Friends and Family: More than three times a week    Frequency of Social Gatherings with Friends and Family: Once a week    Attends Religious Services: More than 4 times per year    Active Member of [employer] or Organizations: No    Attends Banker Meetings: Never  Marital Status: Married  Catering manager Violence: Not At Risk (01/30/2022)   Humiliation, Afraid, Rape, and Kick questionnaire    Fear of Current or Ex-Partner: No    Emotionally Abused: No    Physically Abused: No    Sexually Abused: No    Review of Systems   General: Negative for anorexia, weight loss, fever, chills, fatigue, weakness. ENT: Negative for hoarseness, difficulty swallowing , nasal congestion. CV: Negative for chest pain, angina, palpitations, dyspnea on exertion, peripheral edema.  Respiratory: Negative for dyspnea at rest, dyspnea on exertion, cough, sputum, wheezing.  GI: See history of present illness. GU:  Negative for dysuria, hematuria, urinary incontinence, urinary frequency, nocturnal urination.  Endo: Negative for unusual weight change.     Physical Exam   BP 130/85 (BP Location: Right Arm, Patient Position: Sitting, Cuff Size: Large)   Pulse 80   Temp 98.2 F (36.8 C) (Oral)   Ht 5\' 3"  (1.6 m)   Wt 179 lb 3.2 oz (81.3 kg)   SpO2 98%   BMI 31.74 kg/m    General: Well-nourished, well-developed in no acute distress.  Eyes: No icterus. Mouth: Oropharyngeal mucosa moist and pink   Lungs: Clear to auscultation bilaterally.  Heart: Regular rate and rhythm, no murmurs rubs or gallops.  Abdomen: Bowel sounds are normal,  nondistended, no hepatosplenomegaly or masses,  no abdominal bruits or hernia , no rebound or guarding. Mild ruq/epig  tenderness Rectal: not performed Extremities: No lower extremity edema. No clubbing or deformities. Neuro: Alert and oriented x 4   Skin: Warm and dry, no jaundice.   Psych: Alert and cooperative, normal mood and affect.  Labs   Lab Results  Component Value Date   ALT 18 01/28/2022   AST 16 01/28/2022   ALKPHOS 85 01/28/2022   BILITOT 0.6 01/28/2022    Imaging Studies   No results found.  Assessment   GERD/RUQ pain/epigastric pain: typical heartburn doing ok. Noted flare of symptoms after weaning down on PPI however also at that time, strayed from healthier diet. Last EGD in 2016. She has some NSAID use. Would be reasonable to update EGD to rule out gastritis, ulcers, complicated GERD. Cannot exclude biliary etiology, if EGD negative, consider HIDA to complete gallbladder work up.   IBS: doing well.  Fatty liver: Recommend yearly LFTs, last time 01/2022, normal. Recommend low fat diet, limit etoh, daily exercise, weight control.    PLAN   Egd with Dr. Jena Gauss. ASA 2.  I have discussed the risks, alternatives, benefits with regards to but not limited to the risk of reaction to medication, bleeding, infection, perforation and the patient is agreeable to proceed. Written consent to be obtained. Continue pantoprazole 40mg  daily before breakfast. IBgard two capsules once to twice daily for bloating/pain.   Leanna Battles. Melvyn Neth, MHS, PA-C [facility]

## 2022-08-04 ENCOUNTER — Encounter: Payer: Self-pay | Admitting: *Deleted

## 2022-08-14 DIAGNOSIS — Z0001 Encounter for general adult medical examination with abnormal findings: Secondary | ICD-10-CM | POA: Diagnosis not present

## 2022-08-14 DIAGNOSIS — Z1331 Encounter for screening for depression: Secondary | ICD-10-CM | POA: Diagnosis not present

## 2022-08-14 DIAGNOSIS — E6609 Other obesity due to excess calories: Secondary | ICD-10-CM | POA: Diagnosis not present

## 2022-08-14 DIAGNOSIS — Z6830 Body mass index (BMI) 30.0-30.9, adult: Secondary | ICD-10-CM | POA: Diagnosis not present

## 2022-08-14 DIAGNOSIS — K589 Irritable bowel syndrome without diarrhea: Secondary | ICD-10-CM | POA: Diagnosis not present

## 2022-08-14 DIAGNOSIS — E7849 Other hyperlipidemia: Secondary | ICD-10-CM | POA: Diagnosis not present

## 2022-08-14 DIAGNOSIS — R7309 Other abnormal glucose: Secondary | ICD-10-CM | POA: Diagnosis not present

## 2022-09-18 ENCOUNTER — Encounter (HOSPITAL_COMMUNITY)
Admission: RE | Disposition: A | Discharge status: Home / Self Care | Payer: Self-pay | Source: Home / Self Care | Attending: Internal Medicine

## 2022-09-18 ENCOUNTER — Ambulatory Visit (HOSPITAL_COMMUNITY): Payer: PPO | Admitting: Anesthesiology

## 2022-09-18 ENCOUNTER — Ambulatory Visit (HOSPITAL_BASED_OUTPATIENT_CLINIC_OR_DEPARTMENT_OTHER): Payer: PPO | Admitting: Anesthesiology

## 2022-09-18 ENCOUNTER — Ambulatory Visit (HOSPITAL_COMMUNITY)
Admission: RE | Admit: 2022-09-18 | Discharge: 2022-09-18 | Disposition: A | Discharge status: Home / Self Care | Payer: PPO | Attending: Internal Medicine | Admitting: Internal Medicine

## 2022-09-18 ENCOUNTER — Encounter (HOSPITAL_COMMUNITY): Payer: Self-pay | Admitting: Internal Medicine

## 2022-09-18 ENCOUNTER — Other Ambulatory Visit: Payer: Self-pay

## 2022-09-18 DIAGNOSIS — R1011 Right upper quadrant pain: Secondary | ICD-10-CM

## 2022-09-18 DIAGNOSIS — K219 Gastro-esophageal reflux disease without esophagitis: Secondary | ICD-10-CM | POA: Diagnosis not present

## 2022-09-18 DIAGNOSIS — R12 Heartburn: Secondary | ICD-10-CM

## 2022-09-18 DIAGNOSIS — R1013 Epigastric pain: Secondary | ICD-10-CM

## 2022-09-18 HISTORY — PX: ESOPHAGOGASTRODUODENOSCOPY (EGD) WITH PROPOFOL: SHX5813

## 2022-09-18 SURGERY — ESOPHAGOGASTRODUODENOSCOPY (EGD) WITH PROPOFOL
Anesthesia: General

## 2022-09-18 MED ORDER — LACTATED RINGERS IV SOLN: INTRAVENOUS | Status: DC

## 2022-09-18 MED ORDER — PROPOFOL 10 MG/ML IV BOLUS
INTRAVENOUS | Status: DC | PRN
  Administered 2022-09-18: 50 mg via INTRAVENOUS
  Administered 2022-09-18: 120 mg via INTRAVENOUS

## 2022-09-18 NOTE — Anesthesia Procedure Notes (Signed)
Date/Time: 09/18/2022 9:36 AM  Performed by: Franco Nones, CRNAPre-anesthesia Checklist: Patient identified, Emergency Drugs available, Suction available, Timeout performed and Patient being monitored Patient Re-evaluated:Patient Re-evaluated prior to induction Oxygen Delivery Method: Nasal Cannula

## 2022-09-18 NOTE — H&P (Signed)
@LOGO @   Primary Care Physician:  Elfredia Nevins, MD Primary Gastroenterologist:  Dr. Jena Gauss  Pre-Procedure History & Physical: HPI:  Ruth Warner is a 67 y.o. female here for  further evaluation of epigastric pain worsening reflux symptoms.  Regular NSAID use.  No dysphagia.  Past Medical History:  Diagnosis Date   Abdominal pain, chronic, epigastric    Chronic headaches    GERD (gastroesophageal reflux disease)    History of concussion    Staples, hit a shelf   IBS (irritable bowel syndrome)    Normal cardiac stress test    February 2009 and May 2016 ([facility])   Palpitations    Pelvic relaxation 2015   Rectocele 2015   S/P colonoscopy 2001   Dr. Jena Gauss: normal. Repeat 10 years   S/P endoscopy 2001   Dr. Jena Gauss: normal    Past Surgical History:  Procedure Laterality Date   COLONOSCOPY  07/30/2010   Verner Kopischke: Single diminutive hyperplastic polyp in the mid descending colon status post cold biopsy removal   COLONOSCOPY N/A 03/06/2021   Procedure: COLONOSCOPY;  Surgeon: Corbin Ade, MD;  [facility];  Service: Endoscopy;  Laterality: N/A;  8:30am   ESOPHAGOGASTRODUODENOSCOPY N/A 02/12/2015   [id number] EGD   ESOPHAGOGASTRODUODENOSCOPY ENDOSCOPY     With RMR, unable to remember date.   POLYPECTOMY  03/06/2021   Procedure: POLYPECTOMY;  Surgeon: Corbin Ade, MD;  [facility]: [facility];  Service: Endoscopy;;   TONSILLECTOMY      Prior to Admission medications   Medication Sig Start Date End Date Taking? Authorizing Provider  Ascorbic Acid (VITAMIN C) 1000 MG tablet Take 1,000 mg by mouth daily with breakfast.   Yes [provider]  Cholecalciferol (VITAMIN D-3 PO) Take 2,000 Units by mouth daily with breakfast.   Yes [provider]  cyanocobalamin (VITAMIN B12) 1000 MCG tablet Take 1,000 mcg by mouth daily with breakfast.   Yes [provider]  fexofenadine (ALLEGRA) 180 MG tablet Take 180 mg by mouth every other day.  Alternating with loratadine   Yes [provider]  fluticasone (FLONASE) 50 MCG/ACT nasal spray Place 1 spray into both nostrils in the morning.   Yes [provider]  Garlic 1000 MG CAPS Take 1,000 mg by mouth daily with breakfast.   Yes [provider]  guaiFENesin (MUCINEX) 600 MG 12 hr tablet Take 600 mg by mouth daily as needed (severe allergy symptoms.).   Yes [provider]  HYDROcodone-acetaminophen (NORCO/VICODIN) 5-325 MG tablet Take 1 tablet by mouth every 8 (eight) hours as needed (pain.). 08/15/22  Yes [provider]  ibuprofen (ADVIL) 200 MG tablet Take 200-400 mg by mouth every 8 (eight) hours as needed for moderate pain or headache.   Yes [provider]  Lactobacillus (PROBIOTIC ACIDOPHILUS PO) Take 1 capsule by mouth in the morning.   Yes [provider]  loratadine (CLARITIN) 10 MG tablet Take 10 mg by mouth every other day. Alternating with fexofenadine   Yes [provider]  Omega-3 Fatty Acids (FISH OIL) 1200 MG CAPS Take 1,200 mg by mouth daily with breakfast.   Yes [provider]  pantoprazole (PROTONIX) 40 MG tablet Take 1 tablet (40 mg total) by mouth daily. 01/14/22  Yes Zaineb Nowaczyk, Gerrit Friends, MD  Peppermint Oil (IBGARD PO) Take 2 capsules by mouth daily before lunch. 1 hour prior to lunch   Yes [provider]  Propylene Glycol (SYSTANE COMPLETE) 0.6 % SOLN Place 1 drop into both  eyes 2 (two) times daily.   Yes [provider]  CALCIUM PO Take 1 tablet by mouth daily.    [provider]  hydrocortisone-pramoxine [facility]) 2.5-1 % rectal cream Place 1 application rectally as needed. 05/31/20   Adline Potter, NP    Allergies as of 08/04/2022 - Review Complete 08/01/2022  Allergen Reaction Noted   Cephalexin Hives 04/19/2015   Dexilant [dexlansoprazole]  03/08/2018   Other  02/27/2021   Penicillins Itching     Family History  Problem Relation Age of Onset    Colon cancer Paternal Grandfather    Heart attack Mother    Diabetes Mother    Diabetes Father    Heart attack Father    Alzheimer's disease Father    Colon polyps Father        not pre-cancerous     Social History   Socioeconomic History   Marital status: Married    Spouse name: Not on file   Number of children: 2   Years of education: Not on file   Highest education level: Not on file  Occupational History   Occupation: retired    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: [employer]  Tobacco Use   Smoking status: Never   Smokeless tobacco: Never  Building services engineer Use: Never used  Substance and Sexual Activity   Alcohol use: No    Alcohol/week: 0.0 standard drinks of alcohol   Drug use: No   Sexual activity: Yes    Birth control/protection: Post-menopausal, Other-see comments    Comment: vasectomy  Other Topics Concern   Not on file  Social History Narrative   Not on file   Social Determinants of Health   Financial Resource Strain: Low Risk  (01/30/2022)   Overall Financial Resource Strain (CARDIA)    Difficulty of Paying Living Expenses: Not hard at all  Food Insecurity: No Food Insecurity (01/30/2022)   Hunger Vital Sign    Worried About Running Out of Food in the Last Year: Never true    Ran Out of Food in the Last Year: Never true  Transportation Needs: No Transportation Needs (01/30/2022)   PRAPARE - Administrator, Civil Service (Medical): No    Lack of Transportation (Non-Medical): No  Physical Activity: Inactive (01/30/2022)   Exercise Vital Sign    Days of Exercise per Week: 0 days    Minutes of Exercise per Session: 0 min  Stress: No Stress Concern Present (01/30/2022)   [employer] of Occupational Health - Occupational Stress Questionnaire    Feeling of Stress : Not at all  Social Connections: Moderately Integrated (01/30/2022)   Social Connection and Isolation Panel [NHANES]    Frequency of  Communication with Friends and Family: More than three times a week    Frequency of Social Gatherings with Friends and Family: Once a week    Attends Religious Services: More than 4 times per year    Active Member of [employer] or Organizations: No    Attends Banker Meetings: Never    Marital Status: Married  Catering manager Violence: Not At Risk (01/30/2022)   Humiliation, Afraid, Rape, and Kick questionnaire    Fear of Current or Ex-Partner: No    Emotionally Abused: No    Physically Abused: No    Sexually Abused: No    Review of Systems: See HPI, otherwise negative ROS  Physical Exam: BP (!) 141/67   Pulse 77  Temp 98.5 F (36.9 C) (Oral)   Resp 16   Ht 5\' 3"  (1.6 m)   Wt 80.3 kg   SpO2 97%   BMI 31.35 kg/m  General:   Alert,  Well-developed, well-nourished, pleasant and cooperative in NAD Skin:  Intact without significant lesions or rashes. Eyes:  Sclera clear, no icterus.   Conjunctiva pink. Ears:  Normal auditory acuity. Nose:  No deformity, discharge,  or lesions. Mouth:  No deformity or lesions. Neck:  Supple; no masses or thyromegaly. No significant cervical adenopathy. Lungs:  Clear throughout to auscultation.   No wheezes, crackles, or rhonchi. No acute distress. Heart:  Regular rate and rhythm; no murmurs, clicks, rubs,  or gallops. Abdomen: Non-distended, normal bowel sounds.  Soft and nontender without appreciable mass or hepatosplenomegaly.  Pulses:  Normal pulses noted. Extremities:  Without clubbing or edema.  Impression/Plan:    67 year old lady with worsening GERD right upper quadrant abdominal pain.  No dysphagia.  Here for EGD. The risks, benefits, limitations, alternatives and imponderables have been reviewed with the patient. Potential for esophageal dilation, biopsy, etc. have also been reviewed.  Questions have been answered. All parties agreeable.      Notice: This dictation was prepared with Dragon dictation along with smaller phrase  technology. Any transcriptional errors that result from this process are unintentional and may not be corrected upon review.

## 2022-09-18 NOTE — Anesthesia Preprocedure Evaluation (Signed)
Anesthesia Evaluation  Patient identified by MRN, date of birth, ID band Patient awake    Reviewed: Allergy & Precautions, H&P , NPO status , Patient's Chart, lab work & pertinent test results, reviewed documented beta blocker date and time   Airway Mallampati: II  TM Distance: >3 FB Neck ROM: full    Dental no notable dental hx.    Pulmonary neg pulmonary ROS   Pulmonary exam normal breath sounds clear to auscultation       Cardiovascular Exercise Tolerance: Good negative cardio ROS  Rhythm:regular Rate:Normal     Neuro/Psych  Headaches  Neuromuscular disease negative neurological ROS  negative psych ROS   GI/Hepatic negative GI ROS, Neg liver ROS,GERD  ,,  Endo/Other  negative endocrine ROS    Renal/GU negative Renal ROS  negative genitourinary   Musculoskeletal   Abdominal   Peds  Hematology negative hematology ROS (+)   Anesthesia Other Findings   Reproductive/Obstetrics negative OB ROS                             Anesthesia Physical Anesthesia Plan  ASA: 2  Anesthesia Plan: General   Post-op Pain Management:    Induction:   PONV Risk Score and Plan: Propofol infusion  Airway Management Planned:   Additional Equipment:   Intra-op Plan:   Post-operative Plan:   Informed Consent: I have reviewed the patients History and Physical, chart, labs and discussed the procedure including the risks, benefits and alternatives for the proposed anesthesia with the patient or authorized representative who has indicated his/her understanding and acceptance.     Dental Advisory Given  Plan Discussed with: CRNA  Anesthesia Plan Comments:        Anesthesia Quick Evaluation

## 2022-09-18 NOTE — Transfer of Care (Signed)
Immediate Anesthesia Transfer of Care Note  Patient: Ruth Warner  Procedure(s) Performed: ESOPHAGOGASTRODUODENOSCOPY (EGD) WITH PROPOFOL  Patient Location: Endoscopy Unit  Anesthesia Type:General  Level of Consciousness: awake and patient cooperative  Airway & Oxygen Therapy: Patient Spontanous Breathing  Post-op Assessment: Report given to RN and Post -op Vital signs reviewed and stable  Post vital signs: Reviewed and stable  Last Vitals:  Vitals Value Taken Time  BP 93/41 09/18/22 0950  Temp 36.4 C 09/18/22 0950  Pulse 64 09/18/22 0950  Resp 12 09/18/22 0950  SpO2 97 % 09/18/22 0950    Last Pain:  Vitals:   09/18/22 0950  TempSrc: Oral  PainSc: 0-No pain      Patients Stated Pain Goal: 8 (09/18/22 0829)  Complications: No notable events documented.

## 2022-09-18 NOTE — Op Note (Signed)
[facility] Patient Name: Ruth Warner Procedure Date: 09/18/2022 9:22 AM MRN: [id number] Date of Birth: 1955/06/19 Attending MD: Gennette Pac , MD, [id number] CSN: [id number] Age: 67 Admit Type: Outpatient Procedure:                Upper GI endoscopy Indications:              Abdominal pain in the right upper quadrant,                            Heartburn Providers:                Gennette Pac, MD, Jannett Celestine, RN, Lafonda Mosses, Technician Referring MD:              Medicines:                Propofol per Anesthesia Complications:            No immediate complications. Estimated Blood Loss:     Estimated blood loss: none. Procedure:                Pre-Anesthesia Assessment:                           - Prior to the procedure, a History and Physical                            was performed, and patient medications and                            allergies were reviewed. The patient's tolerance of                            previous anesthesia was also reviewed. The risks                            and benefits of the procedure and the sedation                            options and risks were discussed with the patient.                            All questions were answered, and informed consent                            was obtained. Prior Anticoagulants: The patient has                            taken no anticoagulant or antiplatelet agents. ASA                            Grade Assessment: II - A patient with mild systemic  disease. After reviewing the risks and benefits,                            the patient was deemed in satisfactory condition to                            undergo the procedure.                           After obtaining informed consent, the endoscope was                            passed under direct vision. Throughout the                            procedure, the patient's  blood pressure, pulse, and                            oxygen saturations were monitored continuously. The                            [id number]-H190 ([id number]) scope was introduced through the                            mouth, and advanced to the second part of duodenum.                            The upper GI endoscopy was accomplished without                            difficulty. The patient tolerated the procedure                            well. Scope In: 9:41:22 AM Scope Out: 9:44:44 AM Total Procedure Duration: 0 hours 3 minutes 22 seconds  Findings:      The examined esophagus was normal(undulating Z-line). Gastric cavity       empty.      The entire examined stomach was normal. Patent pylorus.      The duodenal bulb and second portion of the duodenum were normal. Impression:               - Normal esophagus.                           - Normal stomach.                           - Normal duodenal bulb and second portion of the                            duodenum.                           - No specimens collected. Moderate Sedation:      Moderate (conscious) sedation was personally administered by an  anesthesia professional. The following parameters were monitored: oxygen       saturation, heart rate, blood pressure, respiratory rate, EKG, adequacy       of pulmonary ventilation, and response to care. Recommendation:           - Patient has a contact number available for                            emergencies. The signs and symptoms of potential                            delayed complications were discussed with the                            patient. Return to normal activities tomorrow.                            Written discharge instructions were provided to the                            patient.                           - Advance diet as tolerated.                           - Continue present medications. Continue Protonix                            40 mg daily. Best  taken 30 minutes before                            breakfast. Depending on symptom response, may                            consider a HIDA scan to complete her gallbladder                            evaluation.                           - Return to my office in 6 weeks. Procedure Code(s):        --- Professional ---                           [phone], Esophagogastroduodenoscopy, flexible,                            transoral; diagnostic, including collection of                            specimen(s) by brushing or washing, when performed                            (separate procedure) Diagnosis Code(s):        --- Professional ---  R10.11, Right upper quadrant pain                           R12, Heartburn CPT copyright 2022 [employer] Association. All rights reserved. The codes documented in this report are preliminary and upon coder review may  be revised to meet current compliance requirements. Gerrit Friends. Hillery Bhalla, MD Gennette Pac, MD 09/18/2022 10:00:47 AM This report has been signed electronically. Number of Addenda: 0

## 2022-09-18 NOTE — Discharge Instructions (Signed)
EGD Discharge instructions Please read the instructions outlined below and refer to this sheet in the next few weeks. These discharge instructions provide you with general information on caring for yourself after you leave the hospital. Your doctor may also give you specific instructions. While your treatment has been planned according to the most current medical practices available, unavoidable complications occasionally occur. If you have any problems or questions after discharge, please call your doctor. ACTIVITY You may resume your regular activity but move at a slower pace for the next 24 hours.  Take frequent rest periods for the next 24 hours.  Walking will help expel (get rid of) the air and reduce the bloated feeling in your abdomen.  No driving for 24 hours (because of the anesthesia (medicine) used during the test).  You may shower.  Do not sign any important legal documents or operate any machinery for 24 hours (because of the anesthesia used during the test).  NUTRITION Drink plenty of fluids.  You may resume your normal diet.  Begin with a light meal and progress to your normal diet.  Avoid alcoholic beverages for 24 hours or as instructed by your caregiver.  MEDICATIONS You may resume your normal medications unless your caregiver tells you otherwise.  WHAT YOU CAN EXPECT TODAY You may experience abdominal discomfort such as a feeling of fullness or "gas" pains.  FOLLOW-UP Your doctor will discuss the results of your test with you.  SEEK IMMEDIATE MEDICAL ATTENTION IF ANY OF THE FOLLOWING OCCUR: Excessive nausea (feeling sick to your stomach) and/or vomiting.  Severe abdominal pain and distention (swelling).  Trouble swallowing.  Temperature over 101 F (37.8 C).  Rectal bleeding or vomiting of blood.       your upper GI tract look good-normal.    Continue Protonix 40 mg daily- best taken 30 minutes before breakfast  GERD information provided  May or may not need  further evaluation of your gallbladder  Office visit with Ruth Warner in 4 to 6 weeks  At patient request I called Ruth Warner  at [phone] -  call rolled to voicemail.  Left a message.

## 2022-09-22 NOTE — Anesthesia Postprocedure Evaluation (Signed)
Anesthesia Post Note  Patient: Ruth Warner  Procedure(s) Performed: ESOPHAGOGASTRODUODENOSCOPY (EGD) WITH PROPOFOL  Patient location during evaluation: Phase II Anesthesia Type: General Level of consciousness: awake Pain management: pain level controlled Vital Signs Assessment: post-procedure vital signs reviewed and stable Respiratory status: spontaneous breathing and respiratory function stable Cardiovascular status: blood pressure returned to baseline and stable Postop Assessment: no headache and no apparent nausea or vomiting Anesthetic complications: no Comments: Late entry   No notable events documented.   Last Vitals:  Vitals:   09/18/22 0950 09/18/22 0952  BP: (!) 93/41 (!) 105/50  Pulse: 64   Resp: 12   Temp: 36.4 C   SpO2: 97%     Last Pain:  Vitals:   09/18/22 0950  TempSrc: Oral  PainSc: 0-No pain                 Windell Norfolk

## 2022-09-24 ENCOUNTER — Encounter (HOSPITAL_COMMUNITY): Payer: Self-pay | Admitting: Internal Medicine

## 2022-10-15 ENCOUNTER — Encounter: Payer: Self-pay | Admitting: *Deleted

## 2022-10-15 ENCOUNTER — Encounter: Payer: Self-pay | Admitting: Gastroenterology

## 2022-10-15 ENCOUNTER — Ambulatory Visit: Payer: PPO | Admitting: Gastroenterology

## 2022-10-15 VITALS — BP 125/78 | HR 79 | Temp 99.3°F | Ht 63.0 in | Wt 177.6 lb

## 2022-10-15 DIAGNOSIS — R1011 Right upper quadrant pain: Secondary | ICD-10-CM

## 2022-10-15 DIAGNOSIS — R1013 Epigastric pain: Secondary | ICD-10-CM

## 2022-10-15 DIAGNOSIS — K219 Gastro-esophageal reflux disease without esophagitis: Secondary | ICD-10-CM | POA: Diagnosis not present

## 2022-10-15 DIAGNOSIS — K589 Irritable bowel syndrome without diarrhea: Secondary | ICD-10-CM | POA: Diagnosis not present

## 2022-10-15 NOTE — Patient Instructions (Addendum)
Continue pantoprazole 40 g daily before breakfast. Continue peppermint oil daily as needed. HIDA scan planned to look for gallbladder dysfunction.  Will be in touch with results within 5 business days.  If your HIDA scan is unremarkable, next step would be CT scan.

## 2022-10-15 NOTE — Progress Notes (Signed)
GI Office Note    Referring Provider: Elfredia Nevins, MD Primary Care Physician:  Elfredia Nevins, MD  Primary Gastroenterologist: Roetta Sessions, MD   Chief Complaint   Chief Complaint  Patient presents with   Follow-up    Still having abdominal pain    History of Present Illness   Ruth Warner is a 67 y.o. female presenting today for follow up. Last seen 07/2022. H/o GERD, IBS-D, fatty liver.   Background history: On omeprazole for years, but when she was complaining of constant upper abdominal pain last year, for an outside chance that was contributing, she was switched to pantoprazole.  Previously did not tolerate Dexilant due to swelling and feeling bad. Right upper quadrant ultrasound January 21, 2022: Gallbladder unremarkable.  Likely fatty liver. Labs from November 2023: LFTs normal.  Stool Hemoccult negative.   At last office visit, she was complaining of right upper quadrant pain with some radiation into her back after she had weaned herself off of pantoprazole.  Occurred after she went camping and had strayed away from her normal diet.  She restarted PPI. Completed EGD as outlined below.  Tried on IBgard for bloating and pain.  Today: At first peppermint oil seemed to help her abdominal pain she thought. Also could have been due to eating more healthy. Was feeling better at time of EGD. Recently symptoms became a lot worse.  Actually most severe as it has ever been over the past 1 and half years.  She was supposed to be seen in a couple weeks but she had her appointment moved up due to pain.  She has been having stabbing right upper quadrant pain associated with nausea but no vomiting.  Symptoms radiates into the back.  Pain occurring every single day.  Was worse after eating peanuts last week and over the weekend.  Also worse with hamburger and hot dogs.  Movement seems to make it worse as well.  She has been traveling and diet has not been as healthy which she feels may  have contributed to flare of her symptoms.  For the most part her reflux is manageable.  She remains on pantoprazole.  She uses ibuprofen rarely for aches and pains.  Having 2-3 soft/mushy stools daily.  No melena or rectal bleeding.  Bloating improved with peppermint oil.   EGD 08/2022: -normal esophagus -normal stomach -normal duodenal bulb and second portion of duodenum  Colonoscopy 02/2021: -one 3mm polyp mid rectum -one 3mm polyp descending colon -hyperplastic polyps -next colonoscopy 10 years  Medications   Current Outpatient Medications  Medication Sig Dispense Refill   Ascorbic Acid (VITAMIN C) 1000 MG tablet Take 1,000 mg by mouth daily with breakfast.     CALCIUM PO Take 1 tablet by mouth daily.     Cholecalciferol (VITAMIN D-3 PO) Take 2,000 Units by mouth daily with breakfast.     fexofenadine (ALLEGRA) 180 MG tablet Take 180 mg by mouth every other day. Alternating with loratadine     fluticasone (FLONASE) 50 MCG/ACT nasal spray Place 1 spray into both nostrils in the morning.     Garlic 1000 MG CAPS Take 1,000 mg by mouth daily with breakfast.     guaiFENesin (MUCINEX) 600 MG 12 hr tablet Take 600 mg by mouth daily as needed (severe allergy symptoms.).     HYDROcodone-acetaminophen (NORCO/VICODIN) 5-325 MG tablet Take 1 tablet by mouth every 8 (eight) hours as needed (pain.).     hydrocortisone-pramoxine (ANALPRAM-HC) 2.5-1 % rectal cream Place  1 application rectally as needed. 30 g 3   ibuprofen (ADVIL) 200 MG tablet Take 200-400 mg by mouth every 8 (eight) hours as needed for moderate pain or headache.     Lactobacillus (PROBIOTIC ACIDOPHILUS PO) Take 1 capsule by mouth in the morning.     loratadine (CLARITIN) 10 MG tablet Take 10 mg by mouth every other day. Alternating with fexofenadine     Magnesium 500 MG CAPS Take by mouth.     Omega-3 Fatty Acids (FISH OIL) 1200 MG CAPS Take 1,200 mg by mouth daily with breakfast.     pantoprazole (PROTONIX) 40 MG tablet Take 1  tablet (40 mg total) by mouth daily. 30 tablet 11   Peppermint Oil (IBGARD PO) Take 2 capsules by mouth daily before lunch. 1 hour prior to lunch     Propylene Glycol (SYSTANE COMPLETE) 0.6 % SOLN Place 1 drop into both eyes 2 (two) times daily.     No current facility-administered medications for this visit.    Allergies   Allergies as of 10/15/2022 - Review Complete 10/15/2022  Allergen Reaction Noted   Cephalexin Hives 04/19/2015   Dexilant [dexlansoprazole]  03/08/2018   Other  02/27/2021   Penicillins Itching         Review of Systems   General: Negative for anorexia, weight loss, fever, chills, fatigue, weakness. ENT: Negative for hoarseness, difficulty swallowing , nasal congestion. CV: Negative for chest pain, angina, palpitations, dyspnea on exertion, peripheral edema.  Respiratory: Negative for dyspnea at rest, dyspnea on exertion, cough, sputum, wheezing.  GI: See history of present illness. GU:  Negative for dysuria, hematuria, urinary incontinence, urinary frequency, nocturnal urination.  Endo: Negative for unusual weight change.     Physical Exam   BP 125/78 (BP Location: Right Arm, Patient Position: Sitting, Cuff Size: Normal)   Pulse 79   Temp 99.3 F (37.4 C) (Oral)   Ht 5\' 3"  (1.6 m)   Wt 177 lb 9.6 oz (80.6 kg)   SpO2 97%   BMI 31.46 kg/m    General: Well-nourished, well-developed in no acute distress.  Eyes: No icterus. Mouth: Oropharyngeal mucosa moist and pink   Abdomen: Bowel sounds are normal,  nondistended, no hepatosplenomegaly or masses,  no abdominal bruits or hernia , no rebound or guarding. Mild epig/ruq tenderness. Rectal: not performed Extremities: No lower extremity edema. No clubbing or deformities. Neuro: Alert and oriented x 4   Skin: Warm and dry, no jaundice.   Psych: Alert and cooperative, normal mood and affect.  Labs   Lab Results  Component Value Date   ALT 18 01/28/2022   AST 16 01/28/2022   ALKPHOS 85 01/28/2022    BILITOT 0.6 01/28/2022   Labs 07/2022: Creatinine, LFTs, TSH, CBC normal  Imaging Studies   No results found.  Assessment   *RUQ pain *Epigastric pain *GERD *IBS *Fatty liver   Worsening right upper quadrant pain, often postprandial but also occurring without meals.  Cannot exclude musculoskeletal component as well.  EGD unrevealing.  Ultrasound last year with no gallstones.  We will complete gallbladder workup, with HIDA to rule out biliary dyskinesia.  If unremarkable she may need CT to complete workup.  Currently her reflux and IBS are stable.  Possible fatty liver to be followed.  PLAN   Pantoprazole 40 mg daily before breakfast. Continue peppermint oil daily as needed. HIDA scan with fatty meal challenge.  If negative, would consider CT abdomen pelvis with contrast. Possible fatty liver on ultrasound last year,  LFTs normal.  Continue to follow yearly for now. Consider elastography baseline at some point in the next  year or so. Recommend low-fat diet, daily exercise, limit alcohol consumption   Leanna Battles. Melvyn Neth, MHS, PA-C [facility]

## 2022-10-20 ENCOUNTER — Encounter (HOSPITAL_COMMUNITY)
Admission: RE | Admit: 2022-10-20 | Discharge: 2022-10-20 | Disposition: A | Discharge status: Home / Self Care | Payer: PPO | Source: Ambulatory Visit | Attending: Gastroenterology | Admitting: Gastroenterology

## 2022-10-20 DIAGNOSIS — R1011 Right upper quadrant pain: Secondary | ICD-10-CM | POA: Diagnosis not present

## 2022-10-20 DIAGNOSIS — K589 Irritable bowel syndrome without diarrhea: Secondary | ICD-10-CM | POA: Insufficient documentation

## 2022-10-20 DIAGNOSIS — R1013 Epigastric pain: Secondary | ICD-10-CM | POA: Diagnosis not present

## 2022-10-20 DIAGNOSIS — K219 Gastro-esophageal reflux disease without esophagitis: Secondary | ICD-10-CM | POA: Insufficient documentation

## 2022-10-20 MED ORDER — SINCALIDE 5 MCG IJ SOLR
INTRAMUSCULAR | Status: AC
  Filled 2022-10-20: qty 5

## 2022-10-20 MED ORDER — STERILE WATER FOR INJECTION IJ SOLN
INTRAMUSCULAR | Status: AC
  Filled 2022-10-20: qty 10

## 2022-10-20 MED ORDER — TECHNETIUM TC 99M MEBROFENIN IV KIT
5.2000 | PACK | Freq: Once | INTRAVENOUS | Status: AC | PRN
  Administered 2022-10-20: 5.2 via INTRAVENOUS

## 2022-10-24 ENCOUNTER — Encounter: Payer: Self-pay | Admitting: *Deleted

## 2022-10-24 ENCOUNTER — Other Ambulatory Visit: Payer: Self-pay | Admitting: *Deleted

## 2022-10-24 DIAGNOSIS — R1011 Right upper quadrant pain: Secondary | ICD-10-CM

## 2022-10-24 NOTE — Progress Notes (Signed)
error 

## 2022-10-29 ENCOUNTER — Ambulatory Visit: Payer: PPO | Admitting: Gastroenterology

## 2022-10-31 ENCOUNTER — Ambulatory Visit (HOSPITAL_COMMUNITY)
Admission: RE | Admit: 2022-10-31 | Discharge: 2022-10-31 | Disposition: A | Discharge status: Home / Self Care | Payer: PPO | Source: Ambulatory Visit | Attending: Gastroenterology | Admitting: Gastroenterology

## 2022-10-31 DIAGNOSIS — R1011 Right upper quadrant pain: Secondary | ICD-10-CM | POA: Insufficient documentation

## 2022-10-31 DIAGNOSIS — R932 Abnormal findings on diagnostic imaging of liver and biliary tract: Secondary | ICD-10-CM | POA: Diagnosis not present

## 2022-10-31 MED ORDER — IOHEXOL 300 MG/ML  SOLN
100.0000 mL | Freq: Once | INTRAMUSCULAR | Status: AC | PRN
  Administered 2022-10-31: 100 mL via INTRAVENOUS

## 2022-11-11 DIAGNOSIS — D225 Melanocytic nevi of trunk: Secondary | ICD-10-CM | POA: Diagnosis not present

## 2022-11-11 DIAGNOSIS — B9689 Other specified bacterial agents as the cause of diseases classified elsewhere: Secondary | ICD-10-CM | POA: Diagnosis not present

## 2022-11-11 DIAGNOSIS — Z1283 Encounter for screening for malignant neoplasm of skin: Secondary | ICD-10-CM | POA: Diagnosis not present

## 2022-11-11 DIAGNOSIS — L02821 Furuncle of head [any part, except face]: Secondary | ICD-10-CM | POA: Diagnosis not present

## 2023-01-13 ENCOUNTER — Ambulatory Visit (INDEPENDENT_AMBULATORY_CARE_PROVIDER_SITE_OTHER): Payer: PPO | Admitting: Adult Health

## 2023-01-13 ENCOUNTER — Encounter: Payer: Self-pay | Admitting: Adult Health

## 2023-01-13 VITALS — BP 131/72 | HR 86 | Ht 63.0 in | Wt 180.5 lb

## 2023-01-13 DIAGNOSIS — Z4689 Encounter for fitting and adjustment of other specified devices: Secondary | ICD-10-CM | POA: Diagnosis not present

## 2023-01-13 DIAGNOSIS — N814 Uterovaginal prolapse, unspecified: Secondary | ICD-10-CM | POA: Insufficient documentation

## 2023-01-13 DIAGNOSIS — R102 Pelvic and perineal pain: Secondary | ICD-10-CM

## 2023-01-13 DIAGNOSIS — N816 Rectocele: Secondary | ICD-10-CM

## 2023-01-13 NOTE — Telephone Encounter (Signed)
When went to void could feel edge of pessary, let me know tomorrow how it feels

## 2023-01-13 NOTE — Progress Notes (Signed)
  Subjective:     Patient ID: Ruth Warner, female   DOB: January 30, 1956, 67 y.o.   MRN: [id number]  HPI Ruth Warner is a 67 year old white female, married, PM in complaining low abdominal pain Thursday and could not pee, felt ball in vagina and pushed it up and peed all over her hand and felt better and then it happened again Sunday but not as bad. Hurt in back too, esp with standing.      Component Value Date/Time   DIAGPAP  05/31/2020 0850    - Negative for Intraepithelial Lesions or Malignancy (NILM)   DIAGPAP - Benign reactive/reparative changes 05/31/2020 0850   DIAGPAP  09/19/2016 0000    NEGATIVE FOR INTRAEPITHELIAL LESIONS OR MALIGNANCY.   HPVHIGH Negative 05/31/2020 0850   ADEQPAP  05/31/2020 0850    Satisfactory for evaluation; transformation zone component PRESENT.   ADEQPAP  09/19/2016 0000    Satisfactory for evaluation  endocervical/transformation zone component PRESENT.   PCP is Dr Sherwood Gambler.  Review of Systems  +low abdominal pain Thursday and could not pee, felt ball in vagina and pushed it up and peed all over her hand and felt better and then it happened again Sunday but not as bad. Hurt in back too, esp with standing.    Reviewed past medical,surgical, social and family history. Reviewed medications and allergies.  Objective:   Physical Exam BP 131/72 (BP Location: Left Arm, Patient Position: Sitting, Cuff Size: Normal)   Pulse 86   Ht 5\' 3"  (1.6 m)   Wt 180 lb 8 oz (81.9 kg)   BMI 31.97 kg/m     Skin warm and dry.Pelvic: external genitalia is normal in appearance no lesions, vagina: pale, +cystocele,urethra has no lesions or masses noted, cervix:smooth, +prolapse, uterus: normal size, shape and contour, non tender, no masses felt, adnexa: no masses or tenderness noted. Bladder is non tender and no masses felt.  I discussed pessary with her and Dr Despina Hidden in and fitted with #4 ring pessary, with support.   [facility]stream - 01/13/23 1349       Pregnancy Intention Screening    Does the patient want to become pregnant in the next year? N/A    Does the patient's partner want to become pregnant in the next year? N/A    Would the patient like to discuss contraceptive options today? N/A      Contraception Wrap Up   Current Method Vasectomy   PM   End Method Vasectomy   PM   Contraception Counseling Provided No            Examination chaperoned by Malachy Mood LPN  Assessment:     1. Cystocele with rectocele +cystocele  Has low rectocele   2. Uterine prolapse +prolapse   3. Fitting and adjustment of pessary Fitted with #4 milex ring pessary with support   4. Pelvic pain Had low pain Thursday and could not pee, felt ball in vagina, pushed it up and peed all over her hand  No pain today     Plan:     Follow up in 4 weeks for recheck on pessary or sooner if needed

## 2023-01-20 ENCOUNTER — Encounter: Payer: Self-pay | Admitting: Cardiology

## 2023-01-20 ENCOUNTER — Ambulatory Visit: Payer: PPO | Attending: Cardiology | Admitting: Cardiology

## 2023-01-20 VITALS — BP 130/64 | HR 83 | Ht 63.0 in | Wt 181.2 lb

## 2023-01-20 DIAGNOSIS — I471 Supraventricular tachycardia, unspecified: Secondary | ICD-10-CM | POA: Diagnosis not present

## 2023-01-20 NOTE — Patient Instructions (Signed)
Medication Instructions:  Your physician recommends that you continue on your current medications as directed. Please refer to the Current Medication list given to you today.   Labwork: None today  Testing/Procedures: None today  Follow-Up: 1 year  Any Other Special Instructions Will Be Listed Below (If Applicable).  If you need a refill on your cardiac medications before your next appointment, please call your pharmacy.  

## 2023-01-20 NOTE — Progress Notes (Signed)
    Cardiology Office Note  Date: 01/20/2023   ID: Vernon Grenda, DOB 06-14-1955, MRN [id number]  History of Present Illness: Ruth Warner is a 67 y.o. female last seen in November 2023.  She is here for a routine visit.  She reports only occasional brief palpitations and feeling of skips, no sudden dizziness or syncope, and no prolonged tachy-palpitations.  I reviewed her medications.  Still has not required any AV nodal blockers for control of symptoms.  I reviewed her ECG today which shows sinus arrhythmia.  Physical Exam: VS:  BP 130/64 (BP Location: Right Arm, Patient Position: Sitting, Cuff Size: Normal)   Pulse 83   Ht 5\' 3"  (1.6 m)   Wt 181 lb 3.2 oz (82.2 kg)   SpO2 95%   BMI 32.10 kg/m , BMI Body mass index is 32.1 kg/m.  Wt Readings from Last 3 Encounters:  01/20/23 181 lb 3.2 oz (82.2 kg)  01/13/23 180 lb 8 oz (81.9 kg)  10/15/22 177 lb 9.6 oz (80.6 kg)    General: Patient appears comfortable at rest. HEENT: Conjunctiva and lids normal. Lungs: Clear to auscultation, nonlabored breathing at rest. Cardiac: Regular rate and rhythm, no S3 or significant systolic murmur, no pericardial rub.  ECG:  An ECG dated 07/09/2021 was personally reviewed today and demonstrated:  Sinus rhythm.  Labwork: 01/28/2022: ALT 18; AST 07 Aug 2022: Hemoglobin 13.6, platelets 210, BUN 10, creatinine 0.67, potassium 4.9, AST 18, ALT 26, cholesterol 175, triglycerides 96, HDL 46, LDL 111, TSH 1.54  Other Studies Reviewed Today:  No interval cardiac testing for review today.  Assessment and Plan:  PSVT.  No accelerating symptoms.  ECG reviewed and normal.  Plan is to continue observation, we have discussed possible addition of low-dose Toprol-XL if necessary.  Disposition:  Follow up  1 year.  Signed, Jonelle Sidle, M.D., F.A.C.C. [facility]

## 2023-02-06 ENCOUNTER — Other Ambulatory Visit: Payer: Self-pay | Admitting: Internal Medicine

## 2023-02-10 ENCOUNTER — Encounter: Payer: Self-pay | Admitting: Adult Health

## 2023-02-10 ENCOUNTER — Ambulatory Visit: Payer: PPO | Admitting: Adult Health

## 2023-02-10 VITALS — BP 105/59 | HR 64 | Ht 63.0 in | Wt 179.0 lb

## 2023-02-10 DIAGNOSIS — N816 Rectocele: Secondary | ICD-10-CM | POA: Diagnosis not present

## 2023-02-10 DIAGNOSIS — Z4689 Encounter for fitting and adjustment of other specified devices: Secondary | ICD-10-CM

## 2023-02-10 DIAGNOSIS — N814 Uterovaginal prolapse, unspecified: Secondary | ICD-10-CM

## 2023-02-10 DIAGNOSIS — N8110 Cystocele, unspecified: Secondary | ICD-10-CM

## 2023-02-10 NOTE — Progress Notes (Signed)
  Subjective:     Patient ID: Ruth Warner, female   DOB: 02/26/1956, 67 y.o.   MRN: [id number]  HPI Ruth Warner is a 67 year old white female,married, PM in for a pessary maintenance. Has had clear discharge, some pain LLQ this morning, but BM was more hard. Has had sex with pessary in and did fine.  Had #4 milex ring with support placed 01/13/23.     Component Value Date/Time   DIAGPAP  05/31/2020 0850    - Negative for Intraepithelial Lesions or Malignancy (NILM)   DIAGPAP - Benign reactive/reparative changes 05/31/2020 0850   DIAGPAP  09/19/2016 0000    NEGATIVE FOR INTRAEPITHELIAL LESIONS OR MALIGNANCY.   HPVHIGH Negative 05/31/2020 0850   ADEQPAP  05/31/2020 0850    Satisfactory for evaluation; transformation zone component PRESENT.   ADEQPAP  09/19/2016 0000    Satisfactory for evaluation  endocervical/transformation zone component PRESENT.   PCP is Dr Sherwood Gambler.  Review of Systems Has had clear discharge Denies any bleeding or odor BM is more hard, will get stool softener  Reviewed past medical,surgical, social and family history. Reviewed medications and allergies.     Objective:   Physical Exam BP (!) 105/59 (BP Location: Right Arm, Patient Position: Sitting, Cuff Size: Normal)   Pulse 64   Ht 5\' 3"  (1.6 m)   Wt 179 lb (81.2 kg)   BMI 31.71 kg/m     Skin warm and dry.Pelvic: external genitalia is normal in appearance no lesions, vagina: pessary easily removed, +cystocele,and uterine descensus, no lesions, pessary cleaned and reinserted, urethra has no lesions or masses noted, cervix:smooth, uterus: normal size, shape and contour, non tender, no masses felt, adnexa: no masses or tenderness noted. Bladder is non tender and no masses felt.  Examination chaperoned by Ruth Rogue LPN  Assessment:     1. Encounter for pessary maintenance Pessary cleaned and reinserted   2. Cystocele with rectocele   3. Uterine prolapse     Plan:     Return in 3 months for pessary  maintenance

## 2023-02-16 ENCOUNTER — Other Ambulatory Visit (HOSPITAL_COMMUNITY): Payer: Self-pay | Admitting: Adult Health

## 2023-02-16 DIAGNOSIS — Z1231 Encounter for screening mammogram for malignant neoplasm of breast: Secondary | ICD-10-CM

## 2023-02-23 ENCOUNTER — Ambulatory Visit (HOSPITAL_COMMUNITY)
Admission: RE | Admit: 2023-02-23 | Discharge: 2023-02-23 | Disposition: A | Discharge status: Home / Self Care | Payer: PPO | Source: Ambulatory Visit | Attending: Adult Health | Admitting: Adult Health

## 2023-02-23 DIAGNOSIS — Z1231 Encounter for screening mammogram for malignant neoplasm of breast: Secondary | ICD-10-CM | POA: Insufficient documentation

## 2023-02-24 DIAGNOSIS — U071 COVID-19: Secondary | ICD-10-CM | POA: Diagnosis not present

## 2023-03-02 DIAGNOSIS — U099 Post covid-19 condition, unspecified: Secondary | ICD-10-CM | POA: Diagnosis not present

## 2023-03-02 DIAGNOSIS — Z6830 Body mass index (BMI) 30.0-30.9, adult: Secondary | ICD-10-CM | POA: Diagnosis not present

## 2023-03-02 DIAGNOSIS — E6609 Other obesity due to excess calories: Secondary | ICD-10-CM | POA: Diagnosis not present

## 2023-05-12 ENCOUNTER — Ambulatory Visit (INDEPENDENT_AMBULATORY_CARE_PROVIDER_SITE_OTHER): Payer: PPO | Admitting: Adult Health

## 2023-05-12 ENCOUNTER — Encounter: Payer: Self-pay | Admitting: Adult Health

## 2023-05-12 VITALS — BP 123/70 | HR 72 | Ht 63.0 in | Wt 182.5 lb

## 2023-05-12 DIAGNOSIS — Z4689 Encounter for fitting and adjustment of other specified devices: Secondary | ICD-10-CM | POA: Diagnosis not present

## 2023-05-12 DIAGNOSIS — D2221 Melanocytic nevi of right ear and external auricular canal: Secondary | ICD-10-CM | POA: Diagnosis not present

## 2023-05-12 DIAGNOSIS — D485 Neoplasm of uncertain behavior of skin: Secondary | ICD-10-CM | POA: Diagnosis not present

## 2023-05-12 DIAGNOSIS — N814 Uterovaginal prolapse, unspecified: Secondary | ICD-10-CM

## 2023-05-12 DIAGNOSIS — N816 Rectocele: Secondary | ICD-10-CM | POA: Diagnosis not present

## 2023-05-12 DIAGNOSIS — D225 Melanocytic nevi of trunk: Secondary | ICD-10-CM | POA: Diagnosis not present

## 2023-05-12 DIAGNOSIS — Z1283 Encounter for screening for malignant neoplasm of skin: Secondary | ICD-10-CM | POA: Diagnosis not present

## 2023-05-12 DIAGNOSIS — N8110 Cystocele, unspecified: Secondary | ICD-10-CM | POA: Diagnosis not present

## 2023-05-12 NOTE — Progress Notes (Signed)
  Subjective:     Patient ID: Ruth Warner, female   DOB: 06-16-1955, 68 y.o.   MRN: [id number]  HPI Ruth Warner is a 68 year old white female, married, PM in for pessary maintenance and is have itching at times, or discomfort and hurts after BM. She wants to see how see does without it.      Component Value Date/Time   DIAGPAP  05/31/2020 0850    - Negative for Intraepithelial Lesions or Malignancy (NILM)   DIAGPAP - Benign reactive/reparative changes 05/31/2020 0850   DIAGPAP  09/19/2016 0000    NEGATIVE FOR INTRAEPITHELIAL LESIONS OR MALIGNANCY.   HPVHIGH Negative 05/31/2020 0850   ADEQPAP  05/31/2020 0850    Satisfactory for evaluation; transformation zone component PRESENT.   ADEQPAP  09/19/2016 0000    Satisfactory for evaluation  endocervical/transformation zone component PRESENT.  PCP is Dr Sherwood Gambler  Review of Systems  +pessary maintenance and is have itching at times, or discomfort and hurts after BM.   Reviewed past medical,surgical, social and family history. Reviewed medications and allergies.  Objective:   Physical Exam BP 123/70 (BP Location: Left Arm, Patient Position: Sitting, Cuff Size: Normal)   Pulse 72   Ht 5\' 3"  (1.6 m)   Wt 182 lb 8 oz (82.8 kg)   BMI 32.33 kg/m     Skin warm and dry.Pelvic: external genitalia is normal in appearance no lesions, vagina: pessary easily removed, +cystocele,and uterine descensus, no lesions, creamy discharge, no odor, urethra has no lesions or masses noted, cervix:smooth, uterus: normal size, shape and contour, non tender, no masses felt, adnexa: no masses or tenderness noted. Bladder is non tender and no masses felt.  Fall risk is low  Upstream - 05/12/23 1111       Pregnancy Intention Screening   Does the patient want to become pregnant in the next year? N/A    Does the patient's partner want to become pregnant in the next year? N/A    Would the patient like to discuss contraceptive options today? N/A      Contraception Wrap  Up   Current Method No Method - Other Reason   PM   End Method No Method - Other Reason   PM   Contraception Counseling Provided No            Examination chaperoned by Malachy Mood LPN  Assessment:     1. Cystocele with rectocele  2. Uterine prolapse  3. Encounter for pessary maintenance (Primary) Pessary removed and left out, given to pt    Plan:     Return in about 3 week to see Dr Despina Hidden about surgical options

## 2023-05-13 ENCOUNTER — Ambulatory Visit: Payer: PPO | Admitting: Adult Health

## 2023-06-02 ENCOUNTER — Encounter: Payer: Self-pay | Admitting: Obstetrics & Gynecology

## 2023-06-02 ENCOUNTER — Ambulatory Visit: Admitting: Obstetrics & Gynecology

## 2023-06-02 ENCOUNTER — Ambulatory Visit: Payer: PPO | Admitting: Obstetrics & Gynecology

## 2023-06-02 VITALS — BP 136/80 | HR 86

## 2023-06-02 DIAGNOSIS — N814 Uterovaginal prolapse, unspecified: Secondary | ICD-10-CM

## 2023-06-02 DIAGNOSIS — N8110 Cystocele, unspecified: Secondary | ICD-10-CM

## 2023-06-02 DIAGNOSIS — R339 Retention of urine, unspecified: Secondary | ICD-10-CM

## 2023-06-02 MED ORDER — PREMARIN 0.625 MG/GM VA CREA: 1.0000 | TOPICAL_CREAM | Freq: Every day | VAGINAL | 12 refills | Status: AC

## 2023-06-02 NOTE — Progress Notes (Signed)
 Follow up appointment: Discuss surgical options for her POP  Chief Complaint  Patient presents with   Pre-op Exam    Blood pressure 136/80, pulse 86.  Pt has tried to use a pessary but had increasing pain and discharge and has done better with it out  However she continues to dribble and "feel" her bladder, no incontinence  Some constipation but that is many many years  Exam: Grade 3 cystocoele without hypermobile bladder neck Grade 2 uterine prolapse No significant rectocoele No enterocoele  MEDS ordered this encounter: Meds ordered this encounter  Medications   conjugated estrogens (PREMARIN) vaginal cream    Sig: Place 1 Applicatorful vaginally daily. Use 1 gram nightly    Dispense:  30 g    Refill:  12    Orders for this encounter: No orders of the defined types were placed in this encounter.   Impression + Management Plan   ICD-10-CM   1. POP-Q stage 3 cystocele  N81.10     2. Uterine prolapse  N81.4     3. Urinary retention  R33.9      Recommend TVH with VV suspension, anterior colporrhaphy with kelley plication Will wear foley for 1 week post op  Follow Up: Return in about 9 weeks (around 08/04/2023) for Post Op, with Dr Despina Hidden.     All questions were answered.  Past Medical History:  Diagnosis Date   Abdominal pain, chronic, epigastric    Chronic headaches    GERD (gastroesophageal reflux disease)    History of concussion    Staples, hit a shelf   IBS (irritable bowel syndrome)    Normal cardiac stress test    February 2009 and May 2016 ([facility])   Palpitations    Pelvic relaxation 2015   Rectocele 2015   S/P colonoscopy 2001   Dr. Jena Gauss: normal. Repeat 10 years   S/P endoscopy 2001   Dr. Jena Gauss: normal    Past Surgical History:  Procedure Laterality Date   COLONOSCOPY  07/30/2010   Rourk: Single diminutive hyperplastic polyp in the mid descending colon status post cold biopsy removal   COLONOSCOPY N/A 03/06/2021   Procedure:  COLONOSCOPY;  Surgeon: Corbin Ade, MD;  [facility];  Service: Endoscopy;  Laterality: N/A;  8:30am   ESOPHAGOGASTRODUODENOSCOPY N/A 02/12/2015   [id number] EGD   ESOPHAGOGASTRODUODENOSCOPY (EGD) WITH PROPOFOL N/A 09/18/2022   Procedure: ESOPHAGOGASTRODUODENOSCOPY (EGD) WITH PROPOFOL;  Surgeon: Corbin Ade, MD;  Location: [facility];  Service: Endoscopy;  Laterality: N/A;  9:45 AM, ASA 2   ESOPHAGOGASTRODUODENOSCOPY ENDOSCOPY     With RMR, unable to remember date.   POLYPECTOMY  03/06/2021   Procedure: POLYPECTOMY;  Surgeon: Corbin Ade, MD;  Location: [facility];  Service: Endoscopy;;   TONSILLECTOMY      OB History     Gravida  2   Para  2   Term      Preterm      AB      Living  2      SAB      IAB      Ectopic      Multiple      Live Births  2           Allergies  Allergen Reactions   Cephalexin Hives    Patient is not familiar with this allergy   Dexilant [Dexlansoprazole]     Felt bad and swollen   Other     Band aids -  redness for several days    Penicillins Itching    Has patient had a PCN reaction causing immediate rash, facial/tongue/throat swelling, SOB or lightheadedness with hypotension: Yes Has patient had a PCN reaction causing severe rash involving mucus membranes or skin necrosis: No Has patient had a PCN reaction that required hospitalization No Has patient had a PCN reaction occurring within the last 10 years: Yes If all of the above answers are "NO", then may proceed with Cephalosporin use.     Social History   Socioeconomic History   Marital status: Married    Spouse name: Not on file   Number of children: 2   Years of education: Not on file   Highest education level: Not on file  Occupational History   Occupation: retired    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: [employer]  Tobacco Use   Smoking status: Never   Smokeless tobacco: Never  Vaping Use   Vaping  status: Never Used  Substance and Sexual Activity   Alcohol use: No    Alcohol/week: 0.0 standard drinks of alcohol   Drug use: No   Sexual activity: Yes    Birth control/protection: Post-menopausal  Other Topics Concern   Not on file  Social History Narrative   Not on file   Social Drivers of Health   Financial Resource Strain: Low Risk  (01/30/2022)   Overall Financial Resource Strain (CARDIA)    Difficulty of Paying Living Expenses: Not hard at all  Food Insecurity: No Food Insecurity (01/30/2022)   Hunger Vital Sign    Worried About Running Out of Food in the Last Year: Never true    Ran Out of Food in the Last Year: Never true  Transportation Needs: No Transportation Needs (01/30/2022)   PRAPARE - Administrator, Civil Service (Medical): No    Lack of Transportation (Non-Medical): No  Physical Activity: Inactive (01/30/2022)   Exercise Vital Sign    Days of Exercise per Week: 0 days    Minutes of Exercise per Session: 0 min  Stress: No Stress Concern Present (01/30/2022)   [employer] of Occupational Health - Occupational Stress Questionnaire    Feeling of Stress : Not at all  Social Connections: Moderately Integrated (01/30/2022)   Social Connection and Isolation Panel [NHANES]    Frequency of Communication with Friends and Family: More than three times a week    Frequency of Social Gatherings with Friends and Family: Once a week    Attends Religious Services: More than 4 times per year    Active Member of [employer] or Organizations: No    Attends Engineer, structural: Never    Marital Status: Married    Family History  Problem Relation Age of Onset   Colon cancer Paternal Grandfather    Heart attack Mother    Diabetes Mother    Diabetes Father    Heart attack Father    Alzheimer's disease Father    Colon polyps Father        not pre-cancerous

## 2023-06-04 ENCOUNTER — Encounter: Payer: Self-pay | Admitting: Obstetrics & Gynecology

## 2023-06-23 DIAGNOSIS — U071 COVID-19: Secondary | ICD-10-CM | POA: Diagnosis not present

## 2023-07-23 ENCOUNTER — Other Ambulatory Visit: Payer: Self-pay | Admitting: Obstetrics & Gynecology

## 2023-07-23 DIAGNOSIS — Z01818 Encounter for other preprocedural examination: Secondary | ICD-10-CM

## 2023-07-23 NOTE — Patient Instructions (Signed)
 Ruth Warner  07/23/2023     @PREFPERIOPPHARMACY @   Your procedure is scheduled on Tuesday, 5/6.  Report to [facility] at 0900 A.M.  Call this number if you have problems the morning of surgery:  [phone]  If you experience any cold or flu symptoms such as cough, fever, chills, shortness of breath, etc. between now and your scheduled surgery, please notify us  at the above number.   Remember:  Do not eat solid food after midnight.  You may drink clear liquids until 0700.  Clear liquids allowed are:                    Water , Juice (No red color; non-citric and without pulp; diabetics please choose diet or no sugar options), Carbonated beverages (diabetics please choose diet or no sugar options), Clear Tea (No creamer, milk, or cream, including half & half and powdered creamer), and Black Coffee Only (No creamer, milk or cream, including half & half and powdered creamer)    Take these medicines the morning of surgery with A SIP OF WATER  flonase, claritin, protonix     Do not wear jewelry, make-up or nail polish, including gel polish,  artificial nails, or any other type of covering on natural nails (fingers and  toes).  Do not wear lotions, powders, or perfumes, or deodorant.  Do not shave 48 hours prior to surgery.  Men may shave face and neck.  Do not bring valuables to the hospital.  [facility] is not responsible for any belongings or valuables.  Contacts, dentures or bridgework may not be worn into surgery.  Leave your suitcase in the car.  After surgery it may be brought to your room.  For patients admitted to the hospital, discharge time will be determined by your treatment team.  Patients discharged the day of surgery will not be allowed to drive home.   Name and phone number of your driver:   family Special instructions:  Drink you carb loading drink at 0700 the morning of surgery.  Please read over the following fact sheets that you were given. Pain Booklet,  Coughing and Deep Breathing, Surgical Site Infection Prevention, Anesthesia Post-op Instructions, and Care and Recovery After Surgery      How to Use Chlorhexidine at Home in the Shower Chlorhexidine gluconate (CHG) is a germ-killing (antiseptic) wash that's used to clean the skin. It can get rid of the germs that normally live on the skin and can keep them away for about 24 hours. If you're having surgery, you may be told to shower with CHG at home the night before surgery. This can help lower your risk for infection. To use CHG wash in the shower, follow the steps below. Supplies needed: CHG body wash. Clean washcloth. Clean towel. How to use CHG in the shower Follow these steps unless you're told to use CHG in a different way: Start the shower. Use your normal soap and shampoo to wash your face and hair. Turn off the shower or move out of the shower stream. Pour CHG onto a clean washcloth. Do not use any type of brush or rough sponge. Start at your neck, washing your body down to your toes. Make sure you: Wash the part of your body where the surgery will be done for at least 1 minute. Do not scrub. Do not use CHG on your head or face unless your health care provider tells you to. If it gets into your ears or eyes, rinse  them well with water . Do not wash your genitals with CHG. Wash your back and under your arms. Make sure to wash skin folds. Let the CHG sit on your skin for 1-2 minutes or as long as told. Rinse your entire body in the shower, including all body creases and folds. Turn off the shower. Dry off with a clean towel. Do not put anything on your skin afterward, such as powder, lotion, or perfume. Put on clean clothes or pajamas. If it's the night before surgery, sleep in clean sheets. General tips Use CHG only as told, and follow the instructions on the label. Use the full amount of CHG as told. This is often one bottle. Do not smoke and stay away from flames after using  CHG. Your skin may feel sticky after using CHG. This is normal. The sticky feeling will go away as the CHG dries. Do not use CHG: If you have a chlorhexidine allergy or have reacted to chlorhexidine in the past. On open wounds or areas of skin that have broken skin, cuts, or scrapes. On babies younger than 82 months of age. Contact a health care provider if: You have questions about using CHG. Your skin gets irritated or itchy. You have a rash after using CHG. You swallow any CHG. Call your local poison control center [phone] in the [place] Your eyes itch badly, or they become very red or swollen. Your hearing changes. You have trouble seeing. If you can't reach your provider, go to an urgent care or emergency room. Do not drive yourself. Get help right away if: You have swelling or tingling in your mouth or throat. You make high-pitched whistling sounds when you breathe, most often when you breathe out (wheeze). You have trouble breathing. These symptoms may be an emergency. Call 911 right away. Do not wait to see if the symptoms will go away. Do not drive yourself to the hospital. This information is not intended to replace advice given to you by your health care provider. Make sure you discuss any questions you have with your health care provider. Document Revised: 09/23/2022 Document Reviewed: 09/19/2021 Elsevier Patient Education  2024 Elsevier Inc.Laparoscopically Assisted Vaginal Hysterectomy  A laparoscopic assisted vaginal hysterectomy (LAVH) is a surgery to remove the uterus. The surgery is minimally invasive. This means that very small incisions are made, instead of one large incision. This surgery can be done with or without a robot. During this surgery, your fallopian tubes and ovaries may also be taken out. This surgery may be done if you have: Growths in the uterus, such as fibroids. This is the most common reason. Endometriosis. This is when the lining of the uterus  is growing outside of the uterus. Pelvic organ prolapse. This is when the uterus has moved down and bulges into the vagina. Cancer of the uterus or cervix. Abnormal or heavy bleeding during your period. Long-term (chronic) pain in the pelvis. After this surgery, you'll no longer be able to have a baby, and you'll no longer have a period. Tell a health care provider about: Any allergies you have. All medicines you're taking. These include vitamins, herbs, eye drops, creams, and over-the-counter medicines. Any problems you or family members have had with anesthesia. Any bleeding problems you have. Any surgeries you've had. Any medical conditions you have. Whether you're pregnant or may be pregnant. What are the risks? Your health care provider will talk with you about risks. These may include: Bleeding or blood clots in the legs or  lungs. Infection. Damage to nearby structures or organs, including nerve, bladder, or bowel injury. Reaction to the medicines used. Early symptoms of menopause. You may have hot flashes, mood swings, dryness of the vagina, and low estrogen if both ovaries are removed. What happens before the surgery? When to stop eating and drinking Follow instructions from your provider about what you may eat and drink. These may include: 8 hours before your surgery Stop eating most foods. Do not eat meat, fried foods, or fatty foods. Eat only light foods, such as toast or crackers. All liquids are okay except energy drinks and alcohol. 6 hours before your surgery Stop eating. Drink only clear liquids, such as water , clear fruit juice, black coffee, plain tea, and sports drinks. Do not drink energy drinks or alcohol. 2 hours before your surgery Stop drinking all liquids. You may be allowed to take medicines with small sips of water . If you don't follow your provider's instructions, your surgery may be delayed or canceled. Medicines Ask your provider about: Changing or  stopping your regular medicines. These include any diabetes medicines or blood thinners you take. Taking medicines, such as aspirin and ibuprofen. These medicines can thin your blood. Do not take them unless you are told to. Vitamins, herbs, and supplements. You may be asked to take a medicine to empty your colon (bowel prep). Surgery safety For your safety, your provider may: Remove hair at the surgery site. Ask you to wash with a soap that kills germs. Give you antibiotics. General instructions If you were asked to do a bowel prep before the surgery, do it as told by your provider. Do not use any products that contain nicotine or tobacco for at least 4 weeks before the surgery. These products include cigarettes, chewing tobacco, and vaping devices, such as e-cigarettes. If you need help quitting, ask your provider. What happens during the surgery? You will be given: A sedative. This helps you relax. Anesthesia. This keeps you from feeling pain. It will make you fall asleep. Your surgeon will make several small cuts, or incisions, in your belly. Tools for surgery will be inserted into the small incisions. The surgeon will take out your uterus through the vagina. Your small incisions will be closed with stitches, skin glue, or tape strips. This surgery may vary among providers and hospitals. What happens after the surgery? Your blood pressure, heart rate, breathing rate, and blood oxygen level will be monitored until you leave the hospital. You will be given pain medicine as needed. You will have a soft tube, or catheter, in place for a few hours. It will likely be removed the day after surgery. You will wear a pad because you will have bleeding and discharge from the vagina. Where to find more information [employer] of [employer]st[employer]ians and [employer]necologists: acog.org This information is not intended to replace advice given to you by your health care provider. Make sure you discuss any  questions you have with your health care provider. Document Revised: 06/20/2022 Document Reviewed: 06/20/2022 Elsevier Patient Education  2024 [employer].

## 2023-07-24 ENCOUNTER — Encounter (HOSPITAL_COMMUNITY)
Admission: RE | Admit: 2023-07-24 | Discharge: 2023-07-24 | Disposition: A | Discharge status: Home / Self Care | Source: Ambulatory Visit | Attending: Obstetrics & Gynecology | Admitting: Obstetrics & Gynecology

## 2023-07-24 ENCOUNTER — Other Ambulatory Visit: Payer: Self-pay

## 2023-07-24 ENCOUNTER — Encounter (HOSPITAL_COMMUNITY): Payer: Self-pay

## 2023-07-24 DIAGNOSIS — Z01812 Encounter for preprocedural laboratory examination: Secondary | ICD-10-CM | POA: Insufficient documentation

## 2023-07-24 DIAGNOSIS — Z01818 Encounter for other preprocedural examination: Secondary | ICD-10-CM

## 2023-07-24 LAB — URINALYSIS, ROUTINE W REFLEX MICROSCOPIC
Bilirubin Urine: NEGATIVE
Glucose, UA: NEGATIVE mg/dL
Hgb urine dipstick: NEGATIVE
Ketones, ur: NEGATIVE mg/dL
Leukocytes,Ua: NEGATIVE
Nitrite: NEGATIVE
Protein, ur: NEGATIVE mg/dL
Specific Gravity, Urine: 1.018 (ref 1.005–1.030)
pH: 6 (ref 5.0–8.0)

## 2023-07-24 LAB — COMPREHENSIVE METABOLIC PANEL WITH GFR
ALT: 22 U/L (ref 0–44)
AST: 19 U/L (ref 15–41)
Albumin: 4 g/dL (ref 3.5–5.0)
Alkaline Phosphatase: 67 U/L (ref 38–126)
Anion gap: 9 (ref 5–15)
BUN: 11 mg/dL (ref 8–23)
CO2: 26 mmol/L (ref 22–32)
Calcium: 9.1 mg/dL (ref 8.9–10.3)
Chloride: 101 mmol/L (ref 98–111)
Creatinine, Ser: 0.63 mg/dL (ref 0.44–1.00)
GFR, Estimated: >60 mL/min (ref >60)
Glucose, Bld: 112 mg/dL — ABNORMAL HIGH (ref 70–99)
Potassium: 3.2 mmol/L — ABNORMAL LOW (ref 3.5–5.1)
Sodium: 136 mmol/L (ref 135–145)
Total Bilirubin: 0.7 mg/dL (ref 0.0–1.2)
Total Protein: 6.9 g/dL (ref 6.5–8.1)

## 2023-07-24 LAB — CBC
HCT: 41.6 % (ref 36.0–46.0)
Hemoglobin: 14.1 g/dL (ref 12.0–15.0)
MCH: 31.5 pg (ref 26.0–34.0)
MCHC: 33.9 g/dL (ref 30.0–36.0)
MCV: 93.1 fL (ref 80.0–100.0)
Platelets: 210 10*3/uL (ref 150–400)
RBC: 4.47 MIL/uL (ref 3.87–5.11)
RDW: 12.9 % (ref 11.5–15.5)
WBC: 4.4 10*3/uL (ref 4.0–10.5)
nRBC: 0 % (ref 0.0–0.2)

## 2023-07-24 LAB — RAPID HIV SCREEN (HIV 1/2 AB+AG)
HIV 1/2 Antibodies: NONREACTIVE
HIV-1 P24 Antigen - HIV24: NONREACTIVE

## 2023-07-24 LAB — TYPE AND SCREEN
ABO/RH(D): O POS
Antibody Screen: NEGATIVE

## 2023-07-27 MED ORDER — GENTAMICIN SULFATE 40 MG/ML IJ SOLN
5.0000 mg/kg | INTRAVENOUS | Status: AC
  Administered 2023-07-28: 414 mg via INTRAVENOUS
  Filled 2023-07-27: qty 10.25

## 2023-07-27 MED ORDER — CLINDAMYCIN PHOSPHATE 900 MG/50ML IV SOLN
900.0000 mg | INTRAVENOUS | Status: AC
  Administered 2023-07-28: 900 mg via INTRAVENOUS
  Filled 2023-07-27: qty 50

## 2023-07-28 ENCOUNTER — Ambulatory Visit (HOSPITAL_COMMUNITY)
Admission: RE | Admit: 2023-07-28 | Discharge: 2023-07-28 | Disposition: A | Discharge status: Home / Self Care | Attending: Obstetrics & Gynecology | Admitting: Obstetrics & Gynecology

## 2023-07-28 ENCOUNTER — Other Ambulatory Visit: Payer: Self-pay

## 2023-07-28 ENCOUNTER — Encounter (HOSPITAL_COMMUNITY)
Admission: RE | Disposition: A | Discharge status: Home / Self Care | Payer: Self-pay | Source: Home / Self Care | Attending: Obstetrics & Gynecology

## 2023-07-28 ENCOUNTER — Ambulatory Visit (HOSPITAL_BASED_OUTPATIENT_CLINIC_OR_DEPARTMENT_OTHER): Payer: Self-pay | Admitting: Anesthesiology

## 2023-07-28 ENCOUNTER — Ambulatory Visit (HOSPITAL_COMMUNITY): Payer: Self-pay | Admitting: Anesthesiology

## 2023-07-28 ENCOUNTER — Encounter (HOSPITAL_COMMUNITY): Payer: Self-pay | Admitting: Obstetrics & Gynecology

## 2023-07-28 DIAGNOSIS — N8110 Cystocele, unspecified: Secondary | ICD-10-CM

## 2023-07-28 DIAGNOSIS — N840 Polyp of corpus uteri: Secondary | ICD-10-CM | POA: Diagnosis not present

## 2023-07-28 DIAGNOSIS — N813 Complete uterovaginal prolapse: Secondary | ICD-10-CM | POA: Diagnosis not present

## 2023-07-28 DIAGNOSIS — N814 Uterovaginal prolapse, unspecified: Secondary | ICD-10-CM

## 2023-07-28 DIAGNOSIS — D251 Intramural leiomyoma of uterus: Secondary | ICD-10-CM

## 2023-07-28 DIAGNOSIS — N72 Inflammatory disease of cervix uteri: Secondary | ICD-10-CM | POA: Diagnosis not present

## 2023-07-28 DIAGNOSIS — N8003 Adenomyosis of the uterus: Secondary | ICD-10-CM

## 2023-07-28 DIAGNOSIS — N888 Other specified noninflammatory disorders of cervix uteri: Secondary | ICD-10-CM | POA: Diagnosis not present

## 2023-07-28 DIAGNOSIS — N812 Incomplete uterovaginal prolapse: Secondary | ICD-10-CM | POA: Diagnosis not present

## 2023-07-28 DIAGNOSIS — N879 Dysplasia of cervix uteri, unspecified: Secondary | ICD-10-CM | POA: Diagnosis not present

## 2023-07-28 HISTORY — PX: VAGINAL HYSTERECTOMY: SHX2639

## 2023-07-28 SURGERY — HYSTERECTOMY, VAGINAL
Anesthesia: General | Site: Vagina

## 2023-07-28 MED ORDER — SODIUM CHLORIDE 0.9 % IR SOLN
Status: DC | PRN
  Administered 2023-07-28: 1000 mL

## 2023-07-28 MED ORDER — KETOROLAC TROMETHAMINE 10 MG PO TABS: 10.0000 mg | ORAL_TABLET | Freq: Three times a day (TID) | ORAL | 0 refills | Status: DC | PRN

## 2023-07-28 MED ORDER — ONDANSETRON HCL 4 MG/2ML IJ SOLN
INTRAMUSCULAR | Status: AC
  Filled 2023-07-28: qty 2

## 2023-07-28 MED ORDER — BUPIVACAINE-EPINEPHRINE (PF) 0.5% -1:200000 IJ SOLN
INTRAMUSCULAR | Status: DC | PRN
  Administered 2023-07-28: 30 mL via PERINEURAL

## 2023-07-28 MED ORDER — ONDANSETRON 8 MG PO TBDP: 8.0000 mg | ORAL_TABLET | Freq: Three times a day (TID) | ORAL | 0 refills | Status: DC | PRN

## 2023-07-28 MED ORDER — FENTANYL CITRATE (PF) 250 MCG/5ML IJ SOLN
INTRAMUSCULAR | Status: AC
  Filled 2023-07-28: qty 5

## 2023-07-28 MED ORDER — PHENYLEPHRINE 80 MCG/ML (10ML) SYRINGE FOR IV PUSH (FOR BLOOD PRESSURE SUPPORT)
PREFILLED_SYRINGE | INTRAVENOUS | Status: AC
  Filled 2023-07-28: qty 10

## 2023-07-28 MED ORDER — CIPROFLOXACIN HCL 500 MG PO TABS: 500.0000 mg | ORAL_TABLET | Freq: Two times a day (BID) | ORAL | 0 refills | Status: DC

## 2023-07-28 MED ORDER — LIDOCAINE 2% (20 MG/ML) 5 ML SYRINGE
INTRAMUSCULAR | Status: DC | PRN
  Administered 2023-07-28: 75 mg via INTRAVENOUS

## 2023-07-28 MED ORDER — PROPOFOL 10 MG/ML IV BOLUS
INTRAVENOUS | Status: DC | PRN
  Administered 2023-07-28: 200 mg via INTRAVENOUS

## 2023-07-28 MED ORDER — FENTANYL CITRATE PF 50 MCG/ML IJ SOSY: 25.0000 ug | PREFILLED_SYRINGE | INTRAMUSCULAR | Status: DC | PRN

## 2023-07-28 MED ORDER — OXYCODONE HCL 5 MG PO TABS
5.0000 mg | ORAL_TABLET | Freq: Once | ORAL | Status: AC | PRN
  Administered 2023-07-28: 5 mg via ORAL

## 2023-07-28 MED ORDER — KETOROLAC TROMETHAMINE 30 MG/ML IJ SOLN: 30.0000 mg | INTRAMUSCULAR | Status: AC

## 2023-07-28 MED ORDER — OXYCODONE-ACETAMINOPHEN 5-325 MG PO TABS: 1.0000 | ORAL_TABLET | Freq: Four times a day (QID) | ORAL | 0 refills | Status: DC | PRN

## 2023-07-28 MED ORDER — LACTATED RINGERS IV SOLN: INTRAVENOUS | Status: DC | PRN

## 2023-07-28 MED ORDER — KETAMINE HCL 10 MG/ML IJ SOLN
INTRAMUSCULAR | Status: DC | PRN
  Administered 2023-07-28: 30 mg via INTRAVENOUS

## 2023-07-28 MED ORDER — ONDANSETRON HCL 4 MG/2ML IJ SOLN: 4.0000 mg | Freq: Once | INTRAMUSCULAR | Status: DC | PRN

## 2023-07-28 MED ORDER — KETOROLAC TROMETHAMINE 30 MG/ML IJ SOLN
INTRAMUSCULAR | Status: AC
  Administered 2023-07-28: 30 mg via INTRAVENOUS
  Filled 2023-07-28: qty 1

## 2023-07-28 MED ORDER — ONDANSETRON HCL 4 MG/2ML IJ SOLN
INTRAMUSCULAR | Status: DC | PRN
  Administered 2023-07-28: 4 mg via INTRAVENOUS

## 2023-07-28 MED ORDER — FENTANYL CITRATE (PF) 250 MCG/5ML IJ SOLN
INTRAMUSCULAR | Status: DC | PRN
  Administered 2023-07-28: 100 ug via INTRAVENOUS

## 2023-07-28 MED ORDER — 0.9 % SODIUM CHLORIDE (POUR BTL) OPTIME
TOPICAL | Status: DC | PRN
  Administered 2023-07-28: 1000 mL

## 2023-07-28 MED ORDER — DEXAMETHASONE SODIUM PHOSPHATE 10 MG/ML IJ SOLN
INTRAMUSCULAR | Status: AC
  Filled 2023-07-28: qty 1

## 2023-07-28 MED ORDER — ROCURONIUM BROMIDE 10 MG/ML (PF) SYRINGE
PREFILLED_SYRINGE | INTRAVENOUS | Status: DC | PRN
  Administered 2023-07-28 (×2): 50 mg via INTRAVENOUS

## 2023-07-28 MED ORDER — ROCURONIUM BROMIDE 10 MG/ML (PF) SYRINGE
PREFILLED_SYRINGE | INTRAVENOUS | Status: AC
  Filled 2023-07-28: qty 10

## 2023-07-28 MED ORDER — BUPIVACAINE-EPINEPHRINE (PF) 0.5% -1:200000 IJ SOLN
INTRAMUSCULAR | Status: AC
  Filled 2023-07-28: qty 30

## 2023-07-28 MED ORDER — OXYCODONE HCL 5 MG PO TABS
ORAL_TABLET | ORAL | Status: AC
  Filled 2023-07-28: qty 1

## 2023-07-28 MED ORDER — DEXAMETHASONE SODIUM PHOSPHATE 10 MG/ML IJ SOLN
INTRAMUSCULAR | Status: DC | PRN
  Administered 2023-07-28: 8 mg via INTRAVENOUS

## 2023-07-28 MED ORDER — KETAMINE HCL 50 MG/5ML IJ SOSY
PREFILLED_SYRINGE | INTRAMUSCULAR | Status: AC
  Filled 2023-07-28: qty 5

## 2023-07-28 MED ORDER — OXYCODONE HCL 5 MG/5ML PO SOLN: 5.0000 mg | Freq: Once | ORAL | Status: AC | PRN

## 2023-07-28 MED ORDER — POVIDONE-IODINE 10 % EX SWAB
2.0000 | Freq: Once | CUTANEOUS | Status: AC
  Administered 2023-07-28: 2 via TOPICAL

## 2023-07-28 MED ORDER — PHENYLEPHRINE 80 MCG/ML (10ML) SYRINGE FOR IV PUSH (FOR BLOOD PRESSURE SUPPORT)
PREFILLED_SYRINGE | INTRAVENOUS | Status: DC | PRN
Start: 2023-07-28 — End: 2023-07-28
  Administered 2023-07-28 (×3): 80 ug via INTRAVENOUS
  Administered 2023-07-28 (×2): 160 ug via INTRAVENOUS
  Administered 2023-07-28 (×5): 80 ug via INTRAVENOUS

## 2023-07-28 MED ORDER — SUGAMMADEX SODIUM 200 MG/2ML IV SOLN
INTRAVENOUS | Status: DC | PRN
  Administered 2023-07-28: 400 mg via INTRAVENOUS

## 2023-07-28 MED ORDER — LIDOCAINE 2% (20 MG/ML) 5 ML SYRINGE
INTRAMUSCULAR | Status: AC
  Filled 2023-07-28: qty 5

## 2023-07-28 SURGICAL SUPPLY — 34 items
BAG HAMPER (MISCELLANEOUS) ×2 IMPLANT
COUNTER NDL MAGNETIC 40 RED (SET/KITS/TRAYS/PACK) ×1 IMPLANT
COUNTER NEEDLE MAGNETIC 40 RED (SET/KITS/TRAYS/PACK) ×2 IMPLANT
COVER LIGHT HANDLE STERIS (MISCELLANEOUS) ×4 IMPLANT
DRAPE HALF SHEET 40X57 (DRAPES) ×2 IMPLANT
DRAPE STERI URO 9X17 APER PCH (DRAPES) ×2 IMPLANT
ELECTRODE REM PT RTRN 9FT ADLT (ELECTROSURGICAL) ×2 IMPLANT
GAUZE 4X4 16PLY ~~LOC~~+RFID DBL (SPONGE) ×2 IMPLANT
GLOVE BIOGEL PI IND STRL 7.0 (GLOVE) ×4 IMPLANT
GLOVE BIOGEL PI IND STRL 7.5 (GLOVE) ×1 IMPLANT
GLOVE BIOGEL PI IND STRL 8 (GLOVE) ×2 IMPLANT
GLOVE ECLIPSE 8.0 STRL XLNG CF (GLOVE) ×4 IMPLANT
GLOVE SURG SS PI 7.5 STRL IVOR (GLOVE) ×1 IMPLANT
GOWN STRL REUS W/TWL LRG LVL3 (GOWN DISPOSABLE) ×5 IMPLANT
GOWN STRL REUS W/TWL XL LVL3 (GOWN DISPOSABLE) ×2 IMPLANT
KIT TURNOVER CYSTO (KITS) ×2 IMPLANT
KIT TURNOVER KIT A (KITS) ×2 IMPLANT
MANIFOLD NEPTUNE II (INSTRUMENTS) ×2 IMPLANT
NDL HYPO 21X1.5 SAFETY (NEEDLE) ×1 IMPLANT
NEEDLE HYPO 21X1.5 SAFETY (NEEDLE) ×2 IMPLANT
NS IRRIG 1000ML POUR BTL (IV SOLUTION) ×2 IMPLANT
PACK PERI GYN (CUSTOM PROCEDURE TRAY) ×2 IMPLANT
PAD ARMBOARD POSITIONER FOAM (MISCELLANEOUS) ×2 IMPLANT
POSITIONER HEAD 8X9X4 ADT (SOFTGOODS) ×2 IMPLANT
SET BASIN LINEN APH (SET/KITS/TRAYS/PACK) ×2 IMPLANT
SOL .9 NS 3000ML IRR UROMATIC (IV SOLUTION) ×2 IMPLANT
SUT MNCRL+ AB 3-0 CT1 36 (SUTURE) ×1 IMPLANT
SUT MON AB 3-0 SH 27 (SUTURE) ×1 IMPLANT
SUT VIC AB 0 CT1 27XCR 8 STRN (SUTURE) ×4 IMPLANT
SUT VIC AB 0 CT2 8-18 (SUTURE) ×2 IMPLANT
SYR CONTROL 10ML LL (SYRINGE) ×2 IMPLANT
TRAY FOL W/BAG SLVR 16FR STRL (SET/KITS/TRAYS/PACK) ×2 IMPLANT
VERSALIGHT (MISCELLANEOUS) ×2 IMPLANT
WATER STERILE IRR 1000ML POUR (IV SOLUTION) ×2 IMPLANT

## 2023-07-28 NOTE — Op Note (Signed)
 Preoperative diagnosis:  1.  Grade 3 cystocoele                                         2.  Grade 2 uterine prolapse                                         3.  Increasing pelvic pressure                                         4.  Poor tolerance for pessary  Postoperative diagnosis:  Same as above  Procedure:  Vaginal hysterectomy + vaginal vault suspension Selwyn Dalton culdoplasty)  Surgeon:  Wendelyn Halter MD  Anesthesia:  General Endotracheal  Findings:  pre operatively thought patient had primarily cysotcoele with secondary uterine prolapse.  However after performing the vault suspension her cystocoele was insignificant. No recoceole No intraperiotneal abnormalities    Description of operation:  The patient was taken from the preoperative area to the operating room in stable condition. She underwent GET anesthesia ands  she was placed in the dorsal lithotomy position. Patient was prepped and draped in the usual sterile fashion and a Foley catheter was placed.  A weighted speculum was placed and the cervix was grasped with thyroid  tenaculums both anteriorly and posteriorly.  0.5% Marcaine with 1/200,000 epinephrine was injected in a circumferential fashion about the cervix and the electrocautery unit was used to incise the vagina and push off the cervix.  The posterior cul-de-sac was then entered sharply without difficulty.  The uterosacral ligaments were clamped cut and inspection suture ligated and held.  The cardinal ligaments were then clamped cut transfixion suture ligated and cut. The anterior peritoneum was identified the anterior cul-de-sac was entered sharply without difficulty. The anterior and posterior leaves of the broad ligament were plicated and the uterine vessels were clamped cut and suture ligated. Serial pedicles were taken of the fundus with each pedicle being clamped cut and suture ligated. The utero-ovarian ligaments were crossclamped the uterus was removed and both pedicles  were transfixion suture ligated. There was good hemostasis of all the pedicles.  Tubes and ovaries were very high and atrophic, not accessible for removal without adding morbidity. McCall culdoplasty was then performed in the usual fashion without difficulty with excellent anatomical results.  The cuff was quite high and correctly the cystocoele secondarily.  There really was no anterior colporrhaphy to perform at this point  The anterior posterior vagina and peritoneum were closed in interrupted fashion with good resultant hemostasis. Prior to  closure the lower pelvis and vagina were irrigated vigorously.  The sponge needle and instrument counts were correct x 3.  Total blood loss for the procedure was 20 cc.  The patient received clecoin and gentamicin and 30 mg of Toradol IV preoperatively prophylactically.  She was taken to the recovery room in good stable condition awake alert doing well.  Sixto Duhamel Tasha Jindra 07/28/2023, 1:40 PM

## 2023-07-28 NOTE — Transfer of Care (Signed)
 Immediate Anesthesia Transfer of Care Note  Patient: Ruth Warner  Procedure(s) Performed: HYSTERECTOMY, VAGINAL (Vagina )  Patient Location: PACU  Anesthesia Type:General  Level of Consciousness: awake, alert , oriented, and patient cooperative  Airway & Oxygen Therapy: Patient Spontanous Breathing and Patient connected to face mask oxygen  Post-op Assessment: Report given to RN, Post -op Vital signs reviewed and stable, and Patient moving all extremities X 4  Post vital signs: Reviewed and stable  Last Vitals:  Vitals Value Taken Time  BP 121/56 07/28/23 1347  Temp 36.4 C 07/28/23 1346  Pulse 81 07/28/23 1351  Resp 14 07/28/23 1351  SpO2 98 % 07/28/23 1351  Vitals shown include unfiled device data.  Last Pain:  Vitals:   07/28/23 1346  TempSrc:   PainSc: 0-No pain         Complications: No notable events documented.

## 2023-07-28 NOTE — Discharge Instructions (Signed)
  Post Anesthesia Home Care Instructions  Activity: Get plenty of rest for the remainder of the day. A responsible individual must stay with you for 24 hours following the procedure.  For the next 24 hours, DO NOT: -Drive a car -Paediatric nurse -Drink alcoholic beverages -Take any medication unless instructed by your physician -Make any legal decisions or sign important papers.  Meals: Start with liquid foods such as gelatin or soup. Progress to regular foods as tolerated. Avoid greasy, spicy, heavy foods. If nausea and/or vomiting occur, drink only clear liquids until the nausea and/or vomiting subsides. Call your physician if vomiting continues.  Special Instructions/Symptoms: Your throat may feel dry or sore from the anesthesia or the breathing tube placed in your throat during surgery. If this causes discomfort, gargle with warm salt water. The discomfort should disappear within 24 hours.  If you had a scopolamine patch placed behind your ear for the management of post- operative nausea and/or vomiting:  1. The medication in the patch is effective for 72 hours, after which it should be removed.  Wrap patch in a tissue and discard in the trash. Wash hands thoroughly with soap and water. 2. You may remove the patch earlier than 72 hours if you experience unpleasant side effects which may include dry mouth, dizziness or visual disturbances. 3. Avoid touching the patch. Wash your hands with soap and water after contact with the patch.    Post Anesthesia Home Care Instructions  Activity: Get plenty of rest for the remainder of the day. A responsible individual must stay with you for 24 hours following the procedure.  For the next 24 hours, DO NOT: -Drive a car -Paediatric nurse -Drink alcoholic beverages -Take any medication unless instructed by your physician -Make any legal decisions or sign important papers.  Meals: Start with liquid foods such as gelatin or soup. Progress to  regular foods as tolerated. Avoid greasy, spicy, heavy foods. If nausea and/or vomiting occur, drink only clear liquids until the nausea and/or vomiting subsides. Call your physician if vomiting continues.  Special Instructions/Symptoms: Your throat may feel dry or sore from the anesthesia or the breathing tube placed in your throat during surgery. If this causes discomfort, gargle with warm salt water. The discomfort should disappear within 24 hours.

## 2023-07-28 NOTE — Anesthesia Procedure Notes (Signed)
 Procedure Name: Intubation Date/Time: 07/28/2023 12:00 PM  Performed by: Leeanne Puffer, CRNAPre-anesthesia Checklist: Patient identified, Patient being monitored, Timeout performed, Emergency Drugs available and Suction available Patient Re-evaluated:Patient Re-evaluated prior to induction Oxygen Delivery Method: Circle System Utilized Preoxygenation: Pre-oxygenation with 100% oxygen Induction Type: IV induction Ventilation: Mask ventilation without difficulty Laryngoscope Size: Mac and 3 Grade View: Grade II Tube type: Oral Tube size: 7.0 mm Number of attempts: 1 Airway Equipment and Method: stylet Placement Confirmation: ETT inserted through vocal cords under direct vision, positive ETCO2 and breath sounds checked- equal and bilateral Secured at: 22 cm Tube secured with: Tape Dental Injury: Teeth and Oropharynx as per pre-operative assessment

## 2023-07-28 NOTE — H&P (Signed)
 Preoperative History and Physical  Ruth Warner is a 68 y.o. G2P2 with No LMP recorded. Patient is postmenopausal. admitted for a TVH BSO + anterior colporrhaphy and vaginal vault suspension for POP grade 3 cystocoele + grade 2 uterine.    PMH:    Past Medical History:  Diagnosis Date   Abdominal pain, chronic, epigastric    Chronic headaches    GERD (gastroesophageal reflux disease)    History of concussion    Staples, hit a shelf   IBS (irritable bowel syndrome)    Normal cardiac stress test    February 2009 and May 2016 ([facility])   Palpitations    Pelvic relaxation 2015   Rectocele 2015   S/P colonoscopy 2001   Dr. Riley Warner: normal. Repeat 10 years   S/P endoscopy 2001   Dr. Riley Warner: normal    PSH:     Past Surgical History:  Procedure Laterality Date   COLONOSCOPY  07/30/2010   Ruth Warner: Single diminutive hyperplastic polyp in the mid descending colon status post cold biopsy removal   COLONOSCOPY N/A 03/06/2021   Procedure: COLONOSCOPY;  Surgeon: Ruth Espy, MD;  [facility];  Service: Endoscopy;  Laterality: N/A;  8:30am   ESOPHAGOGASTRODUODENOSCOPY N/A 02/12/2015   [id number] EGD   ESOPHAGOGASTRODUODENOSCOPY (EGD) WITH PROPOFOL  N/A 09/18/2022   Procedure: ESOPHAGOGASTRODUODENOSCOPY (EGD) WITH PROPOFOL ;  Surgeon: Ruth Espy, MD;  [facility]: [facility];  Service: Endoscopy;  Laterality: N/A;  9:45 AM, ASA 2   ESOPHAGOGASTRODUODENOSCOPY ENDOSCOPY     With RMR, unable to remember date.   POLYPECTOMY  03/06/2021   Procedure: POLYPECTOMY;  Surgeon: Ruth Espy, MD;  [facility]: [facility];  Service: Endoscopy;;   TONSILLECTOMY      POb/GynH:      OB History     Gravida  2   Para  2   Term      Preterm      AB      Living  2      SAB      IAB      Ectopic      Multiple      Live Births  2           SH:   Social History   Tobacco Use   Smoking status: Never   Smokeless tobacco: Never  Vaping Use   Vaping status:  Never Used  Substance Use Topics   Alcohol use: No    Alcohol/week: 0.0 standard drinks of alcohol   Drug use: No    FH:    Family History  Problem Relation Age of Onset   Colon cancer Paternal Grandfather    Heart attack Mother    Diabetes Mother    Diabetes Father    Heart attack Father    Alzheimer's disease Father    Colon polyps Father        not pre-cancerous      Allergies:  Allergies  Allergen Reactions   Cephalexin Hives    Patient is not familiar with this allergy   Dexilant [Dexlansoprazole]     Felt bad and swollen   Other     Band aids - redness for several days    Penicillins Itching    Has patient had a PCN reaction causing immediate rash, facial/tongue/throat swelling, SOB or lightheadedness with hypotension: Yes Has patient had a PCN reaction causing severe rash involving mucus membranes or skin necrosis: No Has patient had a PCN reaction that required  hospitalization No Has patient had a PCN reaction occurring within the last 10 years: Yes If all of the above answers are "NO", then may proceed with Cephalosporin use.     Medications:       Current Facility-Administered Medications:    clindamycin (CLEOCIN) IVPB 900 mg, 900 mg, Intravenous, 60 min Pre-Op **AND** gentamicin (GARAMYCIN) 410 mg in dextrose 5 % 100 mL IVPB, 5 mg/kg, Intravenous, 60 min Pre-Op, Ruth Warner, Ruth Duhamel, MD  Review of Systems:   Review of Systems  Constitutional: Negative for fever, chills, weight loss, malaise/fatigue and diaphoresis.  HENT: Negative for hearing loss, ear pain, nosebleeds, congestion, sore throat, neck pain, tinnitus and ear discharge.   Eyes: Negative for blurred vision, double vision, photophobia, pain, discharge and redness.  Respiratory: Negative for cough, hemoptysis, sputum production, shortness of breath, wheezing and stridor.   Cardiovascular: Negative for chest pain, palpitations, orthopnea, claudication, leg swelling and PND.  Gastrointestinal: Positive  for abdominal pain. Negative for heartburn, nausea, vomiting, diarrhea, constipation, blood in stool and melena.  Genitourinary: Negative for dysuria, urgency, frequency, hematuria and flank pain.  Musculoskeletal: Negative for myalgias, back pain, joint pain and falls.  Skin: Negative for itching and rash.  Neurological: Negative for dizziness, tingling, tremors, sensory change, speech change, focal weakness, seizures, loss of consciousness, weakness and headaches.  Endo/Heme/Allergies: Negative for environmental allergies and polydipsia. Does not bruise/bleed easily.  Psychiatric/Behavioral: Negative for depression, suicidal ideas, hallucinations, memory loss and substance abuse. The patient is not nervous/anxious and does not have insomnia.      PHYSICAL EXAM:  Blood pressure (!) 143/71, pulse 80, temperature 98.7 F (37.1 C), temperature source Oral, resp. rate 15, SpO2 97%.    Vitals reviewed. Constitutional: She is oriented to person, place, and time. She appears well-developed and well-nourished.  HENT:  Head: Normocephalic and atraumatic.  Right Ear: External ear normal.  Left Ear: External ear normal.  Nose: Nose normal.  Mouth/Throat: Oropharynx is clear and moist.  Eyes: Conjunctivae and EOM are normal. Pupils are equal, round, and reactive to light. Right eye exhibits no discharge. Left eye exhibits no discharge. No scleral icterus.  Neck: Normal range of motion. Neck supple. No tracheal deviation present. No thyromegaly present.  Cardiovascular: Normal rate, regular rhythm, normal heart sounds and intact distal pulses.  Exam reveals no gallop and no friction rub.   No murmur heard. Respiratory: Effort normal and breath sounds normal. No respiratory distress. She has no wheezes. She has no rales. She exhibits no tenderness.  GI: Soft. Bowel sounds are normal. She exhibits no distension and no mass. There is tenderness. There is no rebound and no guarding.  Genitourinary:        Vulva is normal without lesions Vagina is pink moist without discharge Cervix normal in appearance and pap is normal Uterus is normal size, contour, position, consistency, mobility, non-tender Adnexa is negative with normal sized ovaries by sonogram   Grade 3 cystocoele Grade 2 uterine prolapse  Musculoskeletal: Normal range of motion. She exhibits no edema and no tenderness.  Neurological: She is alert and oriented to person, place, and time. She has normal reflexes. She displays normal reflexes. No cranial nerve deficit. She exhibits normal muscle tone. Coordination normal.  Skin: Skin is warm and dry. No rash noted. No erythema. No pallor.  Psychiatric: She has a normal mood and affect. Her behavior is normal. Judgment and thought content normal.    Labs: Results for orders placed or performed during the hospital encounter of  07/24/23 (from the past 2 weeks)  Urinalysis, Routine w reflex microscopic -Urine, Clean Catch   Collection Time: 07/24/23  9:24 AM  Result Value Ref Range   Color, Urine YELLOW YELLOW   APPearance CLEAR CLEAR   Specific Gravity, Urine 1.018 1.005 - 1.030   pH 6.0 5.0 - 8.0   Glucose, UA NEGATIVE NEGATIVE mg/dL   Hgb urine dipstick NEGATIVE NEGATIVE   Bilirubin Urine NEGATIVE NEGATIVE   Ketones, ur NEGATIVE NEGATIVE mg/dL   Protein, ur NEGATIVE NEGATIVE mg/dL   Nitrite NEGATIVE NEGATIVE   Leukocytes,Ua NEGATIVE NEGATIVE  Type and screen   Collection Time: 07/24/23  9:24 AM  Result Value Ref Range   ABO/RH(D) O POS    Antibody Screen NEG    Sample Expiration      08/07/2023,2359 Performed at [facility], [address], [place], [place] [address]   CBC   Collection Time: 07/24/23 10:00 AM  Result Value Ref Range   WBC 4.4 4.0 - 10.5 K/uL   RBC 4.47 3.87 - 5.11 MIL/uL   Hemoglobin 14.1 12.0 - 15.0 g/dL   HCT 41.3 24.4 - 01.0 %   MCV 93.1 80.0 - 100.0 fL   MCH 31.5 26.0 - 34.0 pg   MCHC 33.9 30.0 - 36.0 g/dL   RDW 27.2 53.6 - 64.4 %    Platelets 210 150 - 400 K/uL   nRBC 0.0 0.0 - 0.2 %  Comprehensive metabolic panel   Collection Time: 07/24/23 10:00 AM  Result Value Ref Range   Sodium 136 135 - 145 mmol/L   Potassium 3.2 (L) 3.5 - 5.1 mmol/L   Chloride 101 98 - 111 mmol/L   CO2 26 22 - 32 mmol/L   Glucose, Bld 112 (H) 70 - 99 mg/dL   BUN 11 8 - 23 mg/dL   Creatinine, Ser 0.34 0.44 - 1.00 mg/dL   Calcium 9.1 8.9 - 74.2 mg/dL   Total Protein 6.9 6.5 - 8.1 g/dL   Albumin 4.0 3.5 - 5.0 g/dL   AST 19 15 - 41 U/L   ALT 22 0 - 44 U/L   Alkaline Phosphatase 67 38 - 126 U/L   Total Bilirubin 0.7 0.0 - 1.2 mg/dL   GFR, Estimated >59 >56 mL/min   Anion gap 9 5 - 15  Rapid HIV screen (HIV 1/2 Ab+Ag)   Collection Time: 07/24/23 10:00 AM  Result Value Ref Range   HIV-1 P24 Antigen - HIV24 NON REACTIVE NON REACTIVE   HIV 1/2 Antibodies NON REACTIVE NON REACTIVE   Interpretation (HIV Ag Ab)      A non reactive test result means that HIV 1 or HIV 2 antibodies and HIV 1 p24 antigen were not detected in the specimen.    EKG: Orders placed or performed in visit on 01/20/23   EKG 12-Lead    Imaging Studies: No results found.    Assessment: Grade 3 cysotocele Grade 2 uterine prolapse No rectocoele or enterocoele  Plan: TVH + BSO(if I can) Anterior colporrhaphy Vaginal vault suspension  Ruth Warner Fable Huisman 07/28/2023 11:18 AM

## 2023-07-28 NOTE — Anesthesia Preprocedure Evaluation (Signed)
 Anesthesia Evaluation  Patient identified by MRN, date of birth, ID band Patient awake    Reviewed: Allergy & Precautions, H&P , NPO status , Patient's Chart, lab work & pertinent test results, reviewed documented beta blocker date and time   Airway Mallampati: II  TM Distance: >3 FB Neck ROM: full    Dental no notable dental hx.    Pulmonary neg pulmonary ROS   Pulmonary exam normal breath sounds clear to auscultation       Cardiovascular Exercise Tolerance: Good hypertension, negative cardio ROS  Rhythm:regular Rate:Normal     Neuro/Psych  Headaches  Neuromuscular disease  negative psych ROS   GI/Hepatic Neg liver ROS,GERD  ,,  Endo/Other  negative endocrine ROS    Renal/GU negative Renal ROS  negative genitourinary   Musculoskeletal   Abdominal   Peds  Hematology negative hematology ROS (+)   Anesthesia Other Findings   Reproductive/Obstetrics negative OB ROS                             Anesthesia Physical Anesthesia Plan  ASA: 2  Anesthesia Plan: General and General ETT   Post-op Pain Management:    Induction:   PONV Risk Score and Plan: Ondansetron  and Scopolamine patch - Pre-op  Airway Management Planned:   Additional Equipment:   Intra-op Plan:   Post-operative Plan:   Informed Consent: I have reviewed the patients History and Physical, chart, labs and discussed the procedure including the risks, benefits and alternatives for the proposed anesthesia with the patient or authorized representative who has indicated his/her understanding and acceptance.     Dental Advisory Given  Plan Discussed with: CRNA  Anesthesia Plan Comments:        Anesthesia Quick Evaluation

## 2023-07-29 ENCOUNTER — Encounter (HOSPITAL_COMMUNITY): Payer: Self-pay | Admitting: Obstetrics & Gynecology

## 2023-07-30 LAB — SURGICAL PATHOLOGY

## 2023-07-31 NOTE — Anesthesia Postprocedure Evaluation (Signed)
 Anesthesia Post Note  Patient: Ruth Warner  Procedure(s) Performed: HYSTERECTOMY, VAGINAL (Vagina )  Patient location during evaluation: Phase II Anesthesia Type: General Level of consciousness: awake Pain management: pain level controlled Vital Signs Assessment: post-procedure vital signs reviewed and stable Respiratory status: spontaneous breathing and respiratory function stable Cardiovascular status: blood pressure returned to baseline and stable Postop Assessment: no headache and no apparent nausea or vomiting Anesthetic complications: no Comments: Late entry   No notable events documented.   Last Vitals:  Vitals:   07/28/23 1415 07/28/23 1430  BP: 119/67 121/64  Pulse: 79 78  Resp: 16 16  Temp: (!) 36 C (!) 36.2 C  SpO2: 97% 100%    Last Pain:  Vitals:   07/29/23 1555  TempSrc:   PainSc: 4                  Coretha Dew

## 2023-08-04 ENCOUNTER — Telehealth: Admitting: Obstetrics & Gynecology

## 2023-08-04 ENCOUNTER — Encounter: Payer: Self-pay | Admitting: Obstetrics & Gynecology

## 2023-08-04 DIAGNOSIS — Z9889 Other specified postprocedural states: Secondary | ICD-10-CM

## 2023-08-04 NOTE — Progress Notes (Signed)
 My Chart connect visit: video + audio Patient is at home I am in my office Total time: 10 minutes   HPI: Patient returns for routine postoperative follow-up having undergone TVH= vv suspension on 07/28/23.  The patient's immediate postoperative recovery has been unremarkable. Since hospital discharge the patient reports doing well, no voiding problems, no constipation Some spotting at times when wipes, no bleeding Pain is pretty well managed.   Current Outpatient Medications: ciprofloxacin  (CIPRO ) 500 MG tablet, Take 1 tablet (500 mg total) by mouth 2 (two) times daily., Disp: 14 tablet, Rfl: 0 fluticasone (FLONASE) 50 MCG/ACT nasal spray, Place 1 spray into both nostrils daily as needed for allergies., Disp: , Rfl:  ketorolac  (TORADOL ) 10 MG tablet, Take 1 tablet (10 mg total) by mouth every 8 (eight) hours as needed., Disp: 15 tablet, Rfl: 0 Lactobacillus (PROBIOTIC ACIDOPHILUS PO), Take 1 capsule by mouth in the morning., Disp: , Rfl:  loratadine (CLARITIN) 10 MG tablet, Take 10 mg by mouth daily., Disp: , Rfl:  oxyCODONE -acetaminophen  (PERCOCET) 5-325 MG tablet, Take 1 tablet by mouth every 6 (six) hours as needed for severe pain (pain score 7-10)., Disp: 24 tablet, Rfl: 0 pantoprazole  (PROTONIX ) 40 MG tablet, Take 1 tablet (40 mg total) by mouth daily., Disp: 30 tablet, Rfl: 11 Propylene Glycol (SYSTANE COMPLETE) 0.6 % SOLN, Place 1 drop into both eyes daily., Disp: , Rfl:  CALCIUM PO, Take 1 tablet by mouth daily. (Patient not taking: Reported on 08/04/2023), Disp: , Rfl:  Cholecalciferol (VITAMIN D -3 PO), Take 2,000 Units by mouth daily with breakfast. (Patient not taking: Reported on 08/04/2023), Disp: , Rfl:  conjugated estrogens  (PREMARIN ) vaginal cream, Place 1 Applicatorful vaginally daily. Use 1 gram nightly (Patient not taking: Reported on 08/04/2023), Disp: 30 g, Rfl: 12 Garlic 1000 MG CAPS, Take 1,000 mg by mouth daily with breakfast. (Patient not taking: Reported on 08/04/2023),  Disp: , Rfl:  hydrocortisone -pramoxine (ANALPRAM -HC) 2.5-1 % rectal cream, Place 1 application rectally as needed. (Patient not taking: Reported on 08/04/2023), Disp: 30 g, Rfl: 3 Magnesium  500 MG CAPS, Take 500 mg by mouth daily. (Patient not taking: Reported on 08/04/2023), Disp: , Rfl:  Omega-3 Fatty Acids (FISH OIL) 1200 MG CAPS, Take 1,200 mg by mouth daily with breakfast. (Patient not taking: Reported on 08/04/2023), Disp: , Rfl:  ondansetron  (ZOFRAN -ODT) 8 MG disintegrating tablet, Take 1 tablet (8 mg total) by mouth every 8 (eight) hours as needed for nausea or vomiting. (Patient not taking: Reported on 08/04/2023), Disp: 8 tablet, Rfl: 0 Peppermint Oil (IBGARD PO), Take 2 capsules by mouth daily before lunch. 1 hour prior to lunch (Patient not taking: Reported on 08/04/2023), Disp: , Rfl:   No current facility-administered medications for this visit.    There were no vitals taken for this visit.  Physical Exam: GEN WDWN NAD  Diagnostic Tests:   Pathology: benign  Impression + Management plan: (Z61.096) Post-operative state: S/P TVH + vv suspension 07/28/23  (primary encounter diagnosis) Comment: no complaints,  voiding well, having normal BMs using miralax, pain mild      Medications Prescribed this encounter: No orders of the defined types were placed in this encounter.     Follow up: Return in about 5 weeks (around 09/08/2023) for Post Op, with Dr Randolm Butte.    Wendelyn Halter, MD Attending Physician for the [facility] 08/04/2023 11:41 AM

## 2023-08-19 DIAGNOSIS — D225 Melanocytic nevi of trunk: Secondary | ICD-10-CM | POA: Diagnosis not present

## 2023-08-19 DIAGNOSIS — Z1283 Encounter for screening for malignant neoplasm of skin: Secondary | ICD-10-CM | POA: Diagnosis not present

## 2023-08-24 DIAGNOSIS — E7849 Other hyperlipidemia: Secondary | ICD-10-CM | POA: Diagnosis not present

## 2023-08-24 DIAGNOSIS — U099 Post covid-19 condition, unspecified: Secondary | ICD-10-CM | POA: Diagnosis not present

## 2023-08-24 DIAGNOSIS — E782 Mixed hyperlipidemia: Secondary | ICD-10-CM | POA: Diagnosis not present

## 2023-08-24 DIAGNOSIS — E6609 Other obesity due to excess calories: Secondary | ICD-10-CM | POA: Diagnosis not present

## 2023-08-24 DIAGNOSIS — Z0001 Encounter for general adult medical examination with abnormal findings: Secondary | ICD-10-CM | POA: Diagnosis not present

## 2023-08-24 DIAGNOSIS — Z6830 Body mass index (BMI) 30.0-30.9, adult: Secondary | ICD-10-CM | POA: Diagnosis not present

## 2023-08-24 DIAGNOSIS — R7309 Other abnormal glucose: Secondary | ICD-10-CM | POA: Diagnosis not present

## 2023-08-24 DIAGNOSIS — Z1331 Encounter for screening for depression: Secondary | ICD-10-CM | POA: Diagnosis not present

## 2023-08-24 DIAGNOSIS — K589 Irritable bowel syndrome without diarrhea: Secondary | ICD-10-CM | POA: Diagnosis not present

## 2023-08-24 DIAGNOSIS — K219 Gastro-esophageal reflux disease without esophagitis: Secondary | ICD-10-CM | POA: Diagnosis not present

## 2023-08-31 ENCOUNTER — Encounter: Payer: Self-pay | Admitting: Obstetrics & Gynecology

## 2023-09-11 ENCOUNTER — Encounter: Payer: Self-pay | Admitting: Obstetrics & Gynecology

## 2023-09-11 ENCOUNTER — Ambulatory Visit: Admitting: Obstetrics & Gynecology

## 2023-09-11 VITALS — BP 137/72 | Ht 63.0 in | Wt 180.0 lb

## 2023-09-11 DIAGNOSIS — Z9889 Other specified postprocedural states: Secondary | ICD-10-CM

## 2023-09-11 NOTE — Progress Notes (Signed)
  HPI: Patient returns for routine postoperative follow-up having undergone TVH + vv suspension on 07/28/23.  The patient's immediate postoperative recovery has been unremarkable. Since hospital discharge the patient reports no rpoblems, spotting has resolved.   Current Outpatient Medications: fluticasone (FLONASE) 50 MCG/ACT nasal spray, Place 1 spray into both nostrils daily as needed for allergies., Disp: , Rfl:  Garlic 1000 MG CAPS, Take 1,000 mg by mouth daily with breakfast., Disp: , Rfl:  hydrocortisone -pramoxine (ANALPRAM -HC) 2.5-1 % rectal cream, Place 1 application rectally as needed., Disp: 30 g, Rfl: 3 Lactobacillus (PROBIOTIC ACIDOPHILUS PO), Take 1 capsule by mouth in the morning., Disp: , Rfl:  loratadine (CLARITIN) 10 MG tablet, Take 10 mg by mouth daily., Disp: , Rfl:  Multiple Vitamins-Minerals (MULTIVITAMIN WITH MINERALS) tablet, Take 1 tablet by mouth daily., Disp: , Rfl:  Omega-3 Fatty Acids (FISH OIL) 1200 MG CAPS, Take 1,200 mg by mouth daily with breakfast., Disp: , Rfl:  pantoprazole  (PROTONIX ) 40 MG tablet, Take 1 tablet (40 mg total) by mouth daily., Disp: 30 tablet, Rfl: 11 Propylene Glycol (SYSTANE COMPLETE) 0.6 % SOLN, Place 1 drop into both eyes daily., Disp: , Rfl:  CALCIUM PO, Take 1 tablet by mouth daily. (Patient not taking: Reported on 09/11/2023), Disp: , Rfl:  Cholecalciferol (VITAMIN D -3 PO), Take 2,000 Units by mouth daily with breakfast. (Patient not taking: Reported on 09/11/2023), Disp: , Rfl:  ciprofloxacin  (CIPRO ) 500 MG tablet, Take 1 tablet (500 mg total) by mouth 2 (two) times daily., Disp: 14 tablet, Rfl: 0 conjugated estrogens  (PREMARIN ) vaginal cream, Place 1 Applicatorful vaginally daily. Use 1 gram nightly (Patient not taking: Reported on 09/11/2023), Disp: 30 g, Rfl: 12 ketorolac  (TORADOL ) 10 MG tablet, Take 1 tablet (10 mg total) by mouth every 8 (eight) hours as needed. (Patient not taking: Reported on 09/11/2023), Disp: 15 tablet, Rfl:  0 ondansetron  (ZOFRAN -ODT) 8 MG disintegrating tablet, Take 1 tablet (8 mg total) by mouth every 8 (eight) hours as needed for nausea or vomiting. (Patient not taking: Reported on 09/11/2023), Disp: 8 tablet, Rfl: 0 oxyCODONE -acetaminophen  (PERCOCET) 5-325 MG tablet, Take 1 tablet by mouth every 6 (six) hours as needed for severe pain (pain score 7-10). (Patient not taking: Reported on 09/11/2023), Disp: 24 tablet, Rfl: 0 Peppermint Oil (IBGARD PO), Take 2 capsules by mouth daily before lunch. 1 hour prior to lunch (Patient not taking: Reported on 09/11/2023), Disp: , Rfl:   No current facility-administered medications for this visit.    Blood pressure 137/72, height 5' 3 (1.6 m), weight 180 lb (81.6 kg).  Physical Exam: Cuff healing well  Intact  ?early grnaulation  Diagnostic Tests:   Pathology: benign  Impression + Management plan:   ICD-10-CM   1. Post-operative state: S/P TVH + vv suspension 07/28/23  Z98.890      x     Medications Prescribed this encounter: No orders of the defined types were placed in this encounter.     Follow up: No follow-ups on file.    Wendelyn Halter, MD Attending Physician for the [facility] 09/11/2023 11:09 AM

## 2023-10-20 ENCOUNTER — Ambulatory Visit: Admitting: Obstetrics & Gynecology

## 2023-10-20 ENCOUNTER — Encounter: Payer: Self-pay | Admitting: Obstetrics & Gynecology

## 2023-10-20 VITALS — BP 128/81 | HR 80 | Ht 63.0 in | Wt 177.0 lb

## 2023-10-20 DIAGNOSIS — Z90710 Acquired absence of both cervix and uterus: Secondary | ICD-10-CM

## 2023-10-20 DIAGNOSIS — Z48816 Encounter for surgical aftercare following surgery on the genitourinary system: Secondary | ICD-10-CM

## 2023-10-20 DIAGNOSIS — Z9889 Other specified postprocedural states: Secondary | ICD-10-CM

## 2023-10-20 NOTE — Progress Notes (Signed)
  HPI: Patient returns for routine postoperative follow-up having undergone TVH VV suspension on 07/28/23.  The patient's immediate postoperative recovery has been unremarkable. Since hospital discharge the patient reports no problems.   Current Outpatient Medications: ciprofloxacin  (CIPRO ) 500 MG tablet, Take 1 tablet (500 mg total) by mouth 2 (two) times daily., Disp: 14 tablet, Rfl: 0 conjugated estrogens  (PREMARIN ) vaginal cream, Place 1 Applicatorful vaginally daily. Use 1 gram nightly, Disp: 30 g, Rfl: 12 fluticasone (FLONASE) 50 MCG/ACT nasal spray, Place 1 spray into both nostrils daily as needed for allergies., Disp: , Rfl:  Garlic 1000 MG CAPS, Take 1,000 mg by mouth daily with breakfast., Disp: , Rfl:  hydrocortisone -pramoxine (ANALPRAM -HC) 2.5-1 % rectal cream, Place 1 application rectally as needed., Disp: 30 g, Rfl: 3 Lactobacillus (PROBIOTIC ACIDOPHILUS PO), Take 1 capsule by mouth in the morning., Disp: , Rfl:  loratadine (CLARITIN) 10 MG tablet, Take 10 mg by mouth daily., Disp: , Rfl:  Multiple Vitamins-Minerals (MULTIVITAMIN WITH MINERALS) tablet, Take 1 tablet by mouth daily., Disp: , Rfl:  Omega-3 Fatty Acids (FISH OIL) 1200 MG CAPS, Take 1,200 mg by mouth daily with breakfast., Disp: , Rfl:  pantoprazole  (PROTONIX ) 40 MG tablet, Take 1 tablet (40 mg total) by mouth daily., Disp: 30 tablet, Rfl: 11 Propylene Glycol (SYSTANE COMPLETE) 0.6 % SOLN, Place 1 drop into both eyes daily., Disp: , Rfl:  CALCIUM PO, Take 1 tablet by mouth daily. (Patient not taking: Reported on 10/20/2023), Disp: , Rfl:  Cholecalciferol (VITAMIN D -3 PO), Take 2,000 Units by mouth daily with breakfast. (Patient not taking: Reported on 10/20/2023), Disp: , Rfl:  ketorolac  (TORADOL ) 10 MG tablet, Take 1 tablet (10 mg total) by mouth every 8 (eight) hours as needed. (Patient not taking: Reported on 10/20/2023), Disp: 15 tablet, Rfl: 0 ondansetron  (ZOFRAN -ODT) 8 MG disintegrating tablet, Take 1 tablet (8 mg  total) by mouth every 8 (eight) hours as needed for nausea or vomiting. (Patient not taking: Reported on 10/20/2023), Disp: 8 tablet, Rfl: 0 oxyCODONE -acetaminophen  (PERCOCET) 5-325 MG tablet, Take 1 tablet by mouth every 6 (six) hours as needed for severe pain (pain score 7-10). (Patient not taking: Reported on 10/20/2023), Disp: 24 tablet, Rfl: 0 Peppermint Oil (IBGARD PO), Take 2 capsules by mouth daily before lunch. 1 hour prior to lunch (Patient not taking: Reported on 10/20/2023), Disp: , Rfl:   No current facility-administered medications for this visit.    Blood pressure 128/81, pulse 80, height 5' 3 (1.6 m), weight 177 lb (80.3 kg).  Physical Exam: Small dot of granulation tissue at midline cuff apex, silver nitrate used Cuff intact Great apical support  Diagnostic Tests:   Pathology: benign  Impression + Management plan:   ICD-10-CM   1. Post-operative state: S/P TVH + vv suspension 07/28/23  Z98.890           Medications Prescribed this encounter: No orders of the defined types were placed in this encounter.     Follow up: Return in about 2 weeks (around 11/03/2023) for treat granulation.    Vonn VEAR Inch, MD Attending Physician for the Center for [facility] and [facility] 10/20/2023 10:31 AM

## 2023-11-06 ENCOUNTER — Encounter: Payer: Self-pay | Admitting: Obstetrics & Gynecology

## 2023-11-06 ENCOUNTER — Ambulatory Visit: Admitting: Obstetrics & Gynecology

## 2023-11-06 VITALS — BP 126/82 | HR 76 | Ht 63.0 in | Wt 184.0 lb

## 2023-11-06 DIAGNOSIS — Z48816 Encounter for surgical aftercare following surgery on the genitourinary system: Secondary | ICD-10-CM

## 2023-11-06 DIAGNOSIS — L929 Granulomatous disorder of the skin and subcutaneous tissue, unspecified: Secondary | ICD-10-CM

## 2023-11-06 DIAGNOSIS — Z9889 Other specified postprocedural states: Secondary | ICD-10-CM

## 2023-11-06 NOTE — Progress Notes (Signed)
  HPI: Patient returns for routine postoperative follow-up having undergone TVH VV suspension on 07/28/23.  The patient's immediate postoperative recovery has been unremarkable. Since hospital discharge the patient reports no problems.  Had sex without issues   Current Outpatient Medications: ciprofloxacin  (CIPRO ) 500 MG tablet, Take 1 tablet (500 mg total) by mouth 2 (two) times daily., Disp: 14 tablet, Rfl: 0 conjugated estrogens  (PREMARIN ) vaginal cream, Place 1 Applicatorful vaginally daily. Use 1 gram nightly, Disp: 30 g, Rfl: 12 fluticasone (FLONASE) 50 MCG/ACT nasal spray, Place 1 spray into both nostrils daily as needed for allergies., Disp: , Rfl:  Garlic 1000 MG CAPS, Take 1,000 mg by mouth daily with breakfast., Disp: , Rfl:  hydrocortisone -pramoxine (ANALPRAM -HC) 2.5-1 % rectal cream, Place 1 application rectally as needed., Disp: 30 g, Rfl: 3 Lactobacillus (PROBIOTIC ACIDOPHILUS PO), Take 1 capsule by mouth in the morning., Disp: , Rfl:  loratadine (CLARITIN) 10 MG tablet, Take 10 mg by mouth daily., Disp: , Rfl:  Multiple Vitamins-Minerals (MULTIVITAMIN WITH MINERALS) tablet, Take 1 tablet by mouth daily., Disp: , Rfl:  Omega-3 Fatty Acids (FISH OIL) 1200 MG CAPS, Take 1,200 mg by mouth daily with breakfast., Disp: , Rfl:  pantoprazole  (PROTONIX ) 40 MG tablet, Take 1 tablet (40 mg total) by mouth daily., Disp: 30 tablet, Rfl: 11 Propylene Glycol (SYSTANE COMPLETE) 0.6 % SOLN, Place 1 drop into both eyes daily., Disp: , Rfl:  CALCIUM PO, Take 1 tablet by mouth daily. (Patient not taking: Reported on 10/20/2023), Disp: , Rfl:  Cholecalciferol (VITAMIN D -3 PO), Take 2,000 Units by mouth daily with breakfast. (Patient not taking: Reported on 10/20/2023), Disp: , Rfl:  ketorolac  (TORADOL ) 10 MG tablet, Take 1 tablet (10 mg total) by mouth every 8 (eight) hours as needed. (Patient not taking: Reported on 10/20/2023), Disp: 15 tablet, Rfl: 0 ondansetron  (ZOFRAN -ODT) 8 MG disintegrating  tablet, Take 1 tablet (8 mg total) by mouth every 8 (eight) hours as needed for nausea or vomiting. (Patient not taking: Reported on 10/20/2023), Disp: 8 tablet, Rfl: 0 oxyCODONE -acetaminophen  (PERCOCET) 5-325 MG tablet, Take 1 tablet by mouth every 6 (six) hours as needed for severe pain (pain score 7-10). (Patient not taking: Reported on 10/20/2023), Disp: 24 tablet, Rfl: 0 Peppermint Oil (IBGARD PO), Take 2 capsules by mouth daily before lunch. 1 hour prior to lunch (Patient not taking: Reported on 10/20/2023), Disp: , Rfl:   No current facility-administered medications for this visit.    Blood pressure 128/81, pulse 80, height 5' 3 (1.6 m), weight 177 lb (80.3 kg).  Physical Exam: Small dot of granulation tissue at midline cuff apex has resolved after 1 silver nitrate application Cuff intact Great apical support  Diagnostic Tests:   Pathology: benign  Impression + Management plan:   ICD-10-CM   1. Post-operative state: S/P TVH + vv suspension 07/28/23  Z98.890     2. Granulation tissue: vaginal apex resolved with 1 app  L92.9            Medications Prescribed this encounter: No orders of the defined types were placed in this encounter.     Follow up: No follow-ups on file.    Vonn VEAR Inch, MD Attending Physician for the Center for [facility] and [facility] 10/20/2023 10:31 AM

## 2024-01-20 DIAGNOSIS — B9689 Other specified bacterial agents as the cause of diseases classified elsewhere: Secondary | ICD-10-CM | POA: Diagnosis not present

## 2024-01-20 DIAGNOSIS — L02821 Furuncle of head [any part, except face]: Secondary | ICD-10-CM | POA: Diagnosis not present

## 2024-01-20 DIAGNOSIS — L820 Inflamed seborrheic keratosis: Secondary | ICD-10-CM | POA: Diagnosis not present

## 2024-02-05 ENCOUNTER — Encounter: Payer: Self-pay | Admitting: Cardiology

## 2024-02-05 ENCOUNTER — Ambulatory Visit: Attending: Cardiology | Admitting: Cardiology

## 2024-02-05 VITALS — BP 122/74 | HR 90 | Ht 63.0 in | Wt 184.6 lb

## 2024-02-05 DIAGNOSIS — I471 Supraventricular tachycardia, unspecified: Secondary | ICD-10-CM

## 2024-02-05 NOTE — Progress Notes (Signed)
    Cardiology Office Note  Date: 02/05/2024   ID: Ruth Warner, DOB 24-Nov-1955, MRN [id number]  History of Present Illness: Ruth Warner is a 67 y.o. female last seen in October 2024.  She is here for a routine visit.  Reports rare palpitations that are generally brief, sometimes a feeling of a brief pause.  No sudden dizziness or syncope and no exertional chest pain.  I reviewed her medications and interval lab work.  She is still following with Dr. Marvine.  I reviewed her ECG today which shows normal sinus rhythm.  Physical Exam: VS:  BP 122/74   Ht 5' 3 (1.6 m)   Wt 184 lb 9.6 oz (83.7 kg)   SpO2 98%   BMI 32.70 kg/m , BMI Body mass index is 32.7 kg/m.  Wt Readings from Last 3 Encounters:  02/05/24 184 lb 9.6 oz (83.7 kg)  11/06/23 184 lb (83.5 kg)  10/20/23 177 lb (80.3 kg)    General: Patient appears comfortable at rest. HEENT: Conjunctiva and lids normal. Lungs: Clear to auscultation, nonlabored breathing at rest. Cardiac: Regular rate and rhythm, no S3 or significant systolic murmur. Extremities: No pitting edema.  ECG:  An ECG dated 01/20/2023 was personally reviewed today and demonstrated:  Sinus arrhythmia.  Labwork: 07/24/2023: ALT 22; AST 19; BUN 11; Creatinine, Ser 0.63; Hemoglobin 14.1; Platelets 210; Potassium 3.2; Sodium 136  June 2025: Hemoglobin 14.2, platelets 213, BUN 11, creatinine 0.78, GFR 83, potassium 5, AST 22, ALT 23, cholesterol 180, triglycerides 72, HDL 48, LDL 118, TSH 1.62  Other Studies Reviewed Today:  No interval cardiac testing for review today.  Assessment and Plan:  PSVT.  No increasing sense of palpitations, no prolonged episodes or associated syncope.  She is not on any specific AV nodal blockers at this time.  ECG is normal.  Plan to continue with observation for now.  Disposition:  Follow up 1 year.  Signed, Jayson JUDITHANN Sierras, M.D., F.A.C.C.  [facility]

## 2024-02-05 NOTE — Patient Instructions (Signed)
 Medication Instructions:  Your physician recommends that you continue on your current medications as directed. Please refer to the Current Medication list given to you today.   Labwork: None today  Testing/Procedures: None today  Follow-Up: 1 year  Any Other Special Instructions Will Be Listed Below (If Applicable).  If you need a refill on your cardiac medications before your next appointment, please call your pharmacy.

## 2024-02-21 ENCOUNTER — Other Ambulatory Visit: Payer: Self-pay | Admitting: Gastroenterology

## 2024-04-12 ENCOUNTER — Ambulatory Visit: Admitting: Gastroenterology

## 2024-04-12 ENCOUNTER — Encounter: Payer: Self-pay | Admitting: Gastroenterology

## 2024-04-12 VITALS — BP 124/58 | HR 63 | Temp 97.8°F | Ht 63.0 in | Wt 185.0 lb

## 2024-04-12 DIAGNOSIS — K219 Gastro-esophageal reflux disease without esophagitis: Secondary | ICD-10-CM | POA: Diagnosis not present

## 2024-04-12 DIAGNOSIS — K580 Irritable bowel syndrome with diarrhea: Secondary | ICD-10-CM | POA: Diagnosis not present

## 2024-04-12 DIAGNOSIS — R1011 Right upper quadrant pain: Secondary | ICD-10-CM

## 2024-04-12 DIAGNOSIS — K760 Fatty (change of) liver, not elsewhere classified: Secondary | ICD-10-CM | POA: Insufficient documentation

## 2024-04-12 MED ORDER — FAMOTIDINE 20 MG PO TABS: 20.0000 mg | ORAL_TABLET | Freq: Every day | ORAL | 3 refills | Status: AC

## 2024-04-12 NOTE — Progress Notes (Signed)
 "    GI Office Note    Referring Provider: Marvine Rush, MD Primary Care Physician:  Marvine Rush, MD  Primary Gastroenterologist: Ozell Hollingshead, MD   Chief Complaint   Chief Complaint  Patient presents with   Follow-up    Pt still has pain off and on RUO pain after CT and HIDA scan.pt states her IBS D still comes and goes. She states not bad enough to need medication    History of Present Illness   Ruth Warner is a 69 y.o. female presenting today for follow-up.  Last seen July 2024.  She has chronic right upper quadrant pain, GERD, suspected fatty liver by u/s.  Background history: On omeprazole  for years, but when she was complaining of constant upper abdominal pain last year, for an outside chance that was contributing, she was switched to pantoprazole .  Previously did not tolerate Dexilant due to swelling and feeling bad. Right upper quadrant ultrasound January 21, 2022: Gallbladder unremarkable.  Likely fatty liver. LFTs have been normal. HIDA and CT A/P completed as outlined below. Stool Hemoccult negative.    Discussed the use of AI scribe software for clinical note transcription with the patient, who gave verbal consent to proceed.  History of Present Illness Since we last saw her she completed hysterectomy for uterine prolapse.   She has longstanding intermittent abdominal pain that was previously severe and sharp and is now milder and sporadic. Pain is present today, in RUQ. Has not been meal related lately. She has not eaten this morning and it is present. Milder and does not pay attention to it anymore. Reassured by negative testing for this pain previously.   For several months she has had burning abdominal sensations attributed to reflux. Pantoprazole  40 mg daily was previously effective but since October 2025 she needed frequent Tums with persistent burning. She missed pantoprazole  while on vacation in December and notes her abdominal pain was not present. She  thought maybe her symptoms were due to pantoprazole  itself or reducing her acid too much. She went to famotidine  20 mg daily 2-3 weeks ago with good relief. Burning recurred when she briefly resumed pantoprazole .    She has episodic IBS-D triggered by high sugar intake, beginning with severe abdominal pain followed by several bowel movements that progress from normal to watery diarrhea. Pain resolves after diarrhea. Episodes last 2-3 hours and have occurred since her twenties, with sugar as a consistent trigger. Is able to manage with diet.   Her u/s in 2023 showed mild fatty liver. She denies diabetes, hypertension, hyperlipidemia, and alcohol use. She only uses acid suppression therapy. Her father had fatty liver without cirrhosis. Blood work in May 2025 was normal, and she is scheduled for additional labs at a wellness visit in April 2026.   Wt Readings from Last 3 Encounters:  04/12/24 185 lb (83.9 kg)  02/05/24 184 lb 9.6 oz (83.7 kg)  11/06/23 184 lb (83.5 kg)     Prior Data     Results       Latest Ref Rng & Units 07/24/2023   10:00 AM 01/28/2022    7:58 AM 10/13/2018    9:55 AM  Hepatic Function  Total Protein 6.5 - 8.1 g/dL 6.9  7.0  6.9   Albumin 3.5 - 5.0 g/dL 4.0  4.7  4.6   AST 15 - 41 U/L 19  16  20    ALT 0 - 44 U/L 22  18  30    Alk Phosphatase 38 -  126 U/L 67  85  77   Total Bilirubin 0.0 - 1.2 mg/dL 0.7  0.6  0.5   Bilirubin, Direct 0.00 - 0.40 mg/dL  9.84        Latest Ref Rng & Units 07/24/2023   10:00 AM 05/30/2019   10:18 AM 04/17/2019   10:29 AM  CBC  WBC 4.0 - 10.5 K/uL 4.4  5.2  5.8   Hemoglobin 12.0 - 15.0 g/dL 85.8  85.7  85.7   Hematocrit 36.0 - 46.0 % 41.6  44.0  42.6   Platelets 150 - 400 K/uL 210  208  209      CT abdomen pelvis with contrast August 2024: IMPRESSION: No findings to explain the patient's pain.  HIDA July 2024: IMPRESSION: 1.  Patent cystic and common bile ducts. 2.  Normal gallbladder ejection fraction.  Right upper quadrant  ultrasound October 2023: IMPRESSION: Probable fatty infiltration of liver as above. Otherwise negative exam.  EGD 08/2022: -normal esophagus -normal stomach -normal duodenal bulb and second portion of duodenum   Colonoscopy 02/2021: -one 3mm polyp mid rectum -one 3mm polyp descending colon -hyperplastic polyps -next colonoscopy 10 years   Medications   Current Outpatient Medications  Medication Sig Dispense Refill   Coconut Oil 1000 MG CAPS Take 1,000 mg by mouth daily.     conjugated estrogens  (PREMARIN ) vaginal cream Place 1 Applicatorful vaginally daily. Use 1 gram nightly 30 g 12   fluticasone (FLONASE) 50 MCG/ACT nasal spray Place 1 spray into both nostrils daily as needed for allergies.     hydrocortisone -pramoxine (ANALPRAM -HC) 2.5-1 % rectal cream Place 1 application rectally as needed. 30 g 3   Lactobacillus (PROBIOTIC ACIDOPHILUS PO) Take 1 capsule by mouth in the morning.     loratadine (CLARITIN) 10 MG tablet Take 10 mg by mouth daily.     Multiple Vitamins-Minerals (MULTIVITAMIN WITH MINERALS) tablet Take 1 tablet by mouth daily.     Omega-3 Fatty Acids (FISH OIL) 1200 MG CAPS Take 1,200 mg by mouth daily with breakfast.     pantoprazole  (PROTONIX ) 40 MG tablet Take 1 tablet (40 mg total) by mouth daily. 30 tablet 2   Propylene Glycol (SYSTANE COMPLETE) 0.6 % SOLN Place 1 drop into both eyes daily.     No current facility-administered medications for this visit.    Allergies   Allergies as of 04/12/2024 - Review Complete 04/12/2024  Allergen Reaction Noted   Cephalexin Hives 04/19/2015   Dexilant [dexlansoprazole]  03/08/2018   Other  02/27/2021   Penicillins Itching      Past Medical History   Past Medical History:  Diagnosis Date   Abdominal pain, chronic, epigastric    Chronic headaches    GERD (gastroesophageal reflux disease)    History of concussion    [place], hit a shelf   IBS (irritable bowel syndrome)    Normal cardiac stress test     February 2009 and May 2016 ([facility])   Palpitations    Pelvic relaxation 2015   Rectocele 2015   S/P colonoscopy 2001   Dr. Shaaron: normal. Repeat 10 years   S/P endoscopy 2001   Dr. Shaaron: normal    Past Surgical History   Past Surgical History:  Procedure Laterality Date   COLONOSCOPY  07/30/2010   Rourk: Single diminutive hyperplastic polyp in the mid descending colon status post cold biopsy removal   COLONOSCOPY N/A 03/06/2021   Procedure: COLONOSCOPY;  Surgeon: Shaaron Lamar HERO, MD;  Location: [facility];  Service:  Endoscopy;  Laterality: N/A;  8:30am   ESOPHAGOGASTRODUODENOSCOPY N/A 02/12/2015   [email or web address] EGD   ESOPHAGOGASTRODUODENOSCOPY (EGD) WITH PROPOFOL  N/A 09/18/2022   Procedure: ESOPHAGOGASTRODUODENOSCOPY (EGD) WITH PROPOFOL ;  Surgeon: Shaaron Lamar HERO, MD;  Location: [facility];  Service: Endoscopy;  Laterality: N/A;  9:45 AM, ASA 2   ESOPHAGOGASTRODUODENOSCOPY ENDOSCOPY     With RMR, unable to remember date.   POLYPECTOMY  03/06/2021   Procedure: POLYPECTOMY;  Surgeon: Shaaron Lamar HERO, MD;  Location: [facility];  Service: Endoscopy;;   TONSILLECTOMY     VAGINAL HYSTERECTOMY N/A 07/28/2023   Procedure: HYSTERECTOMY, VAGINAL;  Surgeon: Jayne Vonn DEL, MD;  Location: [facility];  Service: Gynecology;  Laterality: N/A;    Past Family History   Family History  Problem Relation Age of Onset   Heart attack Mother    Diabetes Mother    Diabetes Father    Heart attack Father    Alzheimer's disease Father    Colon polyps Father        not pre-cancerous    Liver disease Father        fatty liver   Colon cancer Paternal Grandfather     Past Social History   Social History   Socioeconomic History   Marital status: Married    Spouse name: Not on file   Number of children: 2   Years of education: Not on file   Highest education level: Not on file  Occupational History   Occupation: retired    Associate Professor: [employer]    Comment: [employer]  Tobacco Use   Smoking status: Never   Smokeless tobacco: Never  Vaping Use   Vaping status: Never Used  Substance and Sexual Activity   Alcohol use: No    Alcohol/week: 0.0 standard drinks of alcohol   Drug use: No   Sexual activity: Not Currently    Birth control/protection: Surgical, Post-menopausal  Other Topics Concern   Not on file  Social History Narrative   Not on file   Social Drivers of Health   Tobacco Use: Low Risk (04/12/2024)   Patient History    Smoking Tobacco Use: Never    Smokeless Tobacco Use: Never    Passive Exposure: Not on file  Financial Resource Strain: Low Risk (01/30/2022)   Overall Financial Resource Strain (CARDIA)    Difficulty of Paying Living Expenses: Not hard at all  Food Insecurity: No Food Insecurity (01/30/2022)   Hunger Vital Sign    Worried About Running Out of Food in the Last Year: Never true    Ran Out of Food in the Last Year: Never true  Transportation Needs: No Transportation Needs (01/30/2022)   PRAPARE - Administrator, Civil Service (Medical): No    Lack of Transportation (Non-Medical): No  Physical Activity: Inactive (01/30/2022)   Exercise Vital Sign    Days of Exercise per Week: 0 days    Minutes of Exercise per Session: 0 min  Stress: No Stress Concern Present (01/30/2022)   [employer] of Occupational Health - Occupational Stress Questionnaire    Feeling of Stress : Not at all  Social Connections: Moderately Integrated (01/30/2022)   Social Connection and Isolation Panel    Frequency of Communication with Friends and Family: More than three times a week    Frequency of Social Gatherings with Friends and Family: Once a week    Attends Religious Services: More than 4 times per year  Active Member of Clubs or Organizations: No    Attends Banker Meetings: Never    Marital Status: Married  Catering Manager Violence: Not At Risk (01/30/2022)   Humiliation, Afraid, Rape, and  Kick questionnaire    Fear of Current or Ex-Partner: No    Emotionally Abused: No    Physically Abused: No    Sexually Abused: No  Depression (PHQ2-9): Low Risk (01/30/2022)   Depression (PHQ2-9)    PHQ-2 Score: 0  Alcohol Screen: Low Risk (01/30/2022)   Alcohol Screen    Last Alcohol Screening Score (AUDIT): 0  Housing: Low Risk (01/30/2022)   Housing    Last Housing Risk Score: 0  Utilities: Not At Risk (01/30/2022)   AHC Utilities    Threatened with loss of utilities: No  Health Literacy: Not on file    Review of Systems   General: Negative for anorexia, weight loss, fever, chills, fatigue, weakness. ENT: Negative for hoarseness, difficulty swallowing , nasal congestion. CV: Negative for chest pain, angina, palpitations, dyspnea on exertion, peripheral edema.  Respiratory: Negative for dyspnea at rest, dyspnea on exertion, cough, sputum, wheezing.  GI: See history of present illness. GU:  Negative for dysuria, hematuria, urinary incontinence, urinary frequency, nocturnal urination.  Endo: Negative for unusual weight change.     Physical Exam   BP (!) 124/58   Pulse 63   Temp 97.8 F (36.6 C)   Ht 5' 3 (1.6 m)   Wt 185 lb (83.9 kg)   BMI 32.77 kg/m    General: Well-nourished, well-developed in no acute distress.  Eyes: No icterus. Mouth: Oropharyngeal mucosa moist and pink   Lungs: Clear to auscultation bilaterally.  Heart: Regular rate and rhythm, no murmurs rubs or gallops.  Abdomen: Bowel sounds are normal, nontender, nondistended, no hepatosplenomegaly or masses,  no abdominal bruits or hernia , no rebound or guarding.  Rectal: not performed Extremities: No lower extremity edema. No clubbing or deformities. Neuro: Alert and oriented x 4   Skin: Warm and dry, no jaundice.   Psych: Alert and cooperative, normal mood and affect.  Labs   See above  Imaging Studies   No results found.  Assessment/Plan:    Assessment & Plan Gastroesophageal Reflux Disease  (GERD) Concern for epigastric pain related to pantoprazole  - Discontinued pantoprazole . - Initiated famotidine  20 mg daily in the morning. - Educated on monitoring for recurrence of heartburn or reflux symptoms and instructed to report symptom recurrence or worsening. - Discussed potential for tachyphylaxis with extended famotidine  use and advised to notify if efficacy diminishes. - Advised to contact for guidance if symptoms recur within several months, or to schedule an appointment if recurrence occurs after a longer interval. - Return ov in one year  Irritable Bowel Syndrome with Diarrhea (IBS-D) IBS-D symptoms are longstanding, episodic, and self-managed with dietary modification. No new or concerning features present. - Reinforced dietary modification, specifically limiting sugar intake, to reduce IBS-D episodes.  Fatty Liver Fatty liver identified on prior imaging without hepatic enlargement or advanced liver disease. Asymptomatic, without personal history of diabetes, hypertension, or hyperlipidemia, though family history present. Ongoing risk assessment indicated. - Advised regular physical activity (goal: 150 minutes per week, e.g., 30 minutes of walking 5 times per week). - Recommended a low sugar, low fat diet and weight maintenance. - Advised to limit alcohol intake (she abstains). - Instructed to report future laboratory results, particularly liver function tests, for ongoing assessment of fatty liver risk and progression. - FIB 4 1.31 (  07/2023) advanced fibrosis excluded - SAFE with score of 50, intermediate risk for moderate fibrosis, consider additional testing - will follow labs planned in few months. Will consider ELF based on results.       Sonny RAMAN. Ezzard, MHS, PA-C [facility]  "

## 2024-04-12 NOTE — Patient Instructions (Signed)
 Stop pantoprazole .  Start famotidine  20mg  daily. RX sent to pharmacy.  Monitor for poorly controlled acid reflux, heartburn, worsening abdominal pain. If famotidine  does not control symptoms, we may need to consider another medication similar to pantoprazole .   When you complete your next labs, please let me know and I will review with regards to fatty liver.  Instructions for fatty liver: Recommend 1-2# weight loss per week until ideal body weight through exercise & diet. Low fat/cholesterol diet.   Avoid sweets, sodas, fruit juices, sweetened beverages like tea, etc. Gradually increase exercise from 15 min daily up to 1 hr per day 5 days/week. Limit alcohol use.   We will plan to see you back in one year.

## 2024-05-02 ENCOUNTER — Ambulatory Visit: Admitting: Orthopedic Surgery
